# Patient Record
Sex: Male | Born: 1935 | Race: White | Hispanic: No | Smoking: Current every day smoker
Health system: Southern US, Community
[De-identification: ages and names within clinical notes are randomized; demographics above are authoritative.]

## PROBLEM LIST (undated history)

## (undated) DIAGNOSIS — R001 Bradycardia, unspecified: Secondary | ICD-10-CM

## (undated) DIAGNOSIS — H9190 Unspecified hearing loss, unspecified ear: Secondary | ICD-10-CM

## (undated) DIAGNOSIS — Z720 Tobacco use: Secondary | ICD-10-CM

## (undated) DIAGNOSIS — IMO0001 Reserved for inherently not codable concepts without codable children: Secondary | ICD-10-CM

## (undated) DIAGNOSIS — C61 Malignant neoplasm of prostate: Secondary | ICD-10-CM

## (undated) DIAGNOSIS — R011 Cardiac murmur, unspecified: Secondary | ICD-10-CM

## (undated) DIAGNOSIS — D649 Anemia, unspecified: Secondary | ICD-10-CM

## (undated) DIAGNOSIS — E785 Hyperlipidemia, unspecified: Secondary | ICD-10-CM

## (undated) DIAGNOSIS — I10 Essential (primary) hypertension: Secondary | ICD-10-CM

## (undated) HISTORY — DX: Anemia, unspecified: D64.9

## (undated) HISTORY — DX: Tobacco use: Z72.0

## (undated) HISTORY — DX: Hyperlipidemia, unspecified: E78.5

## (undated) HISTORY — DX: Bradycardia, unspecified: R00.1

## (undated) HISTORY — DX: Unspecified hearing loss, unspecified ear: H91.90

## (undated) HISTORY — DX: Essential (primary) hypertension: I10

## (undated) HISTORY — PX: PROSTATE SURGERY: SHX751

## (undated) HISTORY — DX: Cardiac murmur, unspecified: R01.1

## (undated) HISTORY — DX: Reserved for inherently not codable concepts without codable children: IMO0001

## (undated) HISTORY — PX: OTHER SURGICAL HISTORY: SHX169

## (undated) HISTORY — DX: Malignant neoplasm of prostate: C61

---

## 2002-09-23 DIAGNOSIS — C61 Malignant neoplasm of prostate: Secondary | ICD-10-CM

## 2002-09-23 HISTORY — DX: Malignant neoplasm of prostate: C61

## 2004-11-08 ENCOUNTER — Inpatient Hospital Stay (HOSPITAL_COMMUNITY): Admission: RE | Admit: 2004-11-08 | Discharge: 2004-11-13 | Payer: Self-pay | Admitting: Urology

## 2006-01-09 ENCOUNTER — Ambulatory Visit (HOSPITAL_COMMUNITY): Admission: RE | Admit: 2006-01-09 | Discharge: 2006-01-09 | Payer: Self-pay | Admitting: Internal Medicine

## 2006-12-02 ENCOUNTER — Ambulatory Visit (HOSPITAL_COMMUNITY): Admission: RE | Admit: 2006-12-02 | Discharge: 2006-12-02 | Payer: Self-pay | Admitting: Orthopaedic Surgery

## 2006-12-02 ENCOUNTER — Encounter (INDEPENDENT_AMBULATORY_CARE_PROVIDER_SITE_OTHER): Payer: Self-pay | Admitting: Specialist

## 2007-02-09 ENCOUNTER — Ambulatory Visit (HOSPITAL_COMMUNITY): Admission: RE | Admit: 2007-02-09 | Discharge: 2007-02-09 | Payer: Self-pay | Admitting: Internal Medicine

## 2008-09-01 ENCOUNTER — Ambulatory Visit: Payer: Self-pay | Admitting: Cardiology

## 2009-10-12 ENCOUNTER — Ambulatory Visit (HOSPITAL_COMMUNITY): Admission: RE | Admit: 2009-10-12 | Discharge: 2009-10-12 | Payer: Self-pay | Admitting: Nephrology

## 2011-02-05 NOTE — Assessment & Plan Note (Signed)
[facility]                       [place] CARDIOLOGY OFFICE NOTE   NAME:Nathan [name], Nathan [name]                    MRN:          [id number]  DATE:09/01/2008                            DOB:          [birth year]    [name] is a very pleasant 75 year old black gentleman who  is referred by Dr. Avon Gully for bradycardia.   He saw Dr. Felecia Shelling recently in the office where he had an EKG that shows  sinus bradycardia.   He has had no previous cardiac history.  He denies any presyncope,  syncope, exertional dizziness, or shortness of breath unless he really  overdoes it.  He has had no chest tightness or pressure that sounds like  angina.   He has had hypertension for a number of years.  He is currently on no AV  nodal slowing drugs.   He had recent blood work which showed a normal potassium, normal  magnesium, I do not see a TSH.  His calcium was also normal.   PAST MEDICAL HISTORY:  He has a history of prostate cancer about 5 years  ago.  He has had no outpatient surgeries.  He smokes about half a pack  of cigarettes a day and he has for number of years.  He does not drink.  He has no known drug allergies.   CURRENT MEDICATIONS:  1. Allopurinol 300 mg a day.  2. Lisinopril, HCTZ 20/12.5 daily.  3. Gemfibrozil 600 mg p.o. b.i.d.  4. Amlodipine 5 mg a day.  5. Niaspan 500 mg p.o. b.i.d..   I see a carbamazepine level which was therapeutic, but I do not see the  list in his med list.   SOCIAL HISTORY:  He is retired from [employer] in [place].  He did a lot  of manual work.  He is married and has three children.   His family history is really unremarkable for any premature coronary  artery disease or sudden cardiac death.   REVIEW OF SYSTEMS:  He has had some hearing loss in his left ear.  He  has partial plates of this top and bottom jaws.  There is no other  positive symptoms.  All review of systems were questioned.   PHYSICAL  EXAMINATION:  VITAL SIGNS:  He is 5 foot, 9 inches, weighs 187  pounds.  His weight is 128/72, pulse 55 and regular.  His  electrocardiogram shows sinus rhythm with an RSR prime in V1 and V2.  There is nonspecific ST-segment changes.  His PR, QRS, and QTC intervals  were normal.  HEENT:  Arcus senilis.  Sclerae are slightly muddy.  Facial symmetry is  normal.  Dentition reveals plates, oral mucosa is normal.  NECK:  Supple.  Carotid upstrokes were equal bilaterally without any  bruits, no JVD.  Thyroid is not enlarged or tender.  Trachea is midline.  LUNGS:  Clear to auscultation and percussion, reduced breath sounds  throughout.  HEART:  Nondisplaced PMI, soft S1 and S2.  No murmur, rub, or gallop.  ABDOMINAL:  Soft.  No pulsatile mass.  No flank bruits or midline  bruits.  There  is no obvious organomegaly.  EXTREMITIES:  No cyanosis, clubbing, or edema.  Pulses were 2+/4+,  bilaterally symmetrical.  No sign of DVT.  No edema.  NEURO:  Intact.   ASSESSMENT:  1. Asymptomatic bradycardia.  2. Unknown thyroid status.  3. Long history of hypertension.  4. Hyperlipidemia being managed by Dr. Felecia Shelling.  Note, that his last      lipid panel demonstrated an LDL of 145.  His triglycerides were      mildly elevated to 195.  5. Tobacco use.   RECOMMENDATIONS:  1. Check TSH.  2. Consider statin per Dr. Felecia Shelling.   I have reviewed at length symptoms of bradycardia with Nathan [name].  I  have told him to report to Dr. Felecia Shelling or to [place] if that happens.  We have  also talked about exertional shortness of breath and chest pain.  He is  at moderate risk of having an acute coronary event in the future.   I will suggest Dr. Felecia Shelling that he consider adding a low-dose aspirin 81  mg a day.  We will see him back on a p.r.n. basis.     Carzell C. Daleen Squibb, MD, [facility]  Electronically Signed    TCW/MedQ  DD: 09/01/2008  DT: 09/02/2008  Job #: [id number]   cc:   Tesfaye D. Felecia Shelling, MD

## 2011-02-08 NOTE — Consult Note (Signed)
NAME:  Nathan [name], Nathan [name]             ACCOUNT NO.:  [id number]   MEDICAL RECORD NO.:  [id number]          PATIENT TYPE:  INP   LOCATION:  IC07                          FACILITY:  [facility]   PHYSICIAN:  Tesfaye D. Felecia Shelling, MD   DATE OF BIRTH:  11/20/1935   DATE OF CONSULTATION:  11/08/2004  DATE OF DISCHARGE:                                   CONSULTATION   HISTORY OF PRESENT ILLNESS:  This is a 75 year old male patient who is  admitted to ICU after he had a radial prostatectomy for CA of the prostate.  The patient is well known to me from outpatient followup. He has known case  of hypertension. His hypertension has been well controlled. The patient  currently is semi-sedated, and he is not in any form of distress. The  patient's pain seems well controlled. He has no complaints.   REVIEW OF SYSTEMS:  The patient has some pain his surgical area. No  headache, fever, chest pain, shortness of breath, nausea, vomiting,  abdominal pain, dysuria, urgency, or frequency of urination.   PAST MEDICAL HISTORY:  1.  Hypertension.  2.  CA of the prostate.   MEDICATIONS:  As outpatient, Lisinopril HCT 20/12.5.   SOCIAL HISTORY:  The patient is married. He smokes about one pack of  cigarettes daily. The patient drinks alcohol on social basis. No history of  substance abuse.   PHYSICAL EXAMINATION:  GENERAL:  The patient is alert, awake and resting.  VITAL SIGNS:  Blood pressure 119/55, pulse 52, respiratory rate 25,  temperature 98 degree Fahrenheit.  HEENT:  Pupils are equal and reactive.  NECK:  Supple.  CHEST:  Fair air entry. Clear lung fields.  CARDIOVASCULAR:  First and second heart sounds heard. No murmur. No gallop.  ABDOMEN:  Soft _______________. Bowel sounds positive. No mass. No  organomegaly.  EXTREMITIES:  No leg edema.   LABORATORY DATA:  CBC:  5.3, hemoglobin 15.0, hematocrit 42.4, platelets  212. Sodium 138, potassium 4.1, chloride 107, carbon dioxide 26, BUN 17,  creatinine 1.4,  calcium 10.0.   ASSESSMENT:  This is a 75 year old male patient with a history of  hypertension and carcinoma of the prostate, currently post prostatectomy.  The patient has uneventful surgery. He is currently in ICU for monitoring.  His pain is well controlled.   PLAN:  Will continue patient with current treatment. Will continue pain  management. Will monitor his CBC and Chem 7. Continue supportive care.      TDF/MEDQ  D:  11/09/2004  T:  11/09/2004  Job:  [id number]

## 2011-02-08 NOTE — Discharge Summary (Signed)
NAME:  Nathan [name], Nathan [name]             ACCOUNT NO.:  [id number]   MEDICAL RECORD NO.:  [id number]          PATIENT TYPE:  INP   LOCATION:  A201                          FACILITY:  [facility]   PHYSICIAN:  Ky Barban, M.D.DATE OF BIRTH:  12/15/1935   DATE OF ADMISSION:  11/08/2004  DATE OF DISCHARGE:  02/21/2006LH                                 DISCHARGE SUMMARY   A 75 year old gentleman who had elevated PSA of 4.7.  Prostate biopsy was  done which showed Gleason score of 5.  He was advised to undergo radical  prostatectomy.  Other alternatives like radiotherapy were also discussed.  He elected to have radical prostatectomy.  He had routine preadmission  workup done, CBC, urinalysis, CHEM-7, EKG, chest x-ray normal.  He was  brought in on February 16 and underwent radical retropubic prostatectomy and  bilateral pelvic node dissection.  His frozen sections were negative, and so  we did a radical prostatectomy.  He did fine during the procedure, did not  require any blood transfusions.  Preoperatively, hematocrit was 42.4;  postprocedure, hematocrit was 28.9.   His electrolytes preoperatively: Sodium 138, potassium 4.1, chloride 107,  CO2 26, glucose 102, BUN 17, creatinine 1.4, calcium 10.1.  On postoperative  day, February 17, sodium 133, potassium 3.4, chloride 102, CO2 26, glucose  127, BUN 12, creatinine 1.3, calcium 8.   He was closely followed by his family physician, Dr. Felecia Shelling.  KCl was added  to his IV fluids.  There was minimum drainage.   On the second postop day he was doing fine, afebrile.  Blood pressure was  stable.  I took out his ureteral catheter, and shortly some drained.  He was  discharged from ICU onto the floor.  He has been having good bowel sounds.  He was started on clear liquid diet.   On the third postoperative day, the diet was advanced to a regular diet.  He  is up and walking around, and he was given IV Cipro as prophylactic  antibiotic during the  hospital stay.   On 19 February, doing well, afebrile.  Urine is clear.  Started him on  regular diet.  On February 21, he is being discharged home and will be  followed up by me in the office.   His final pathology report is back.  His TNM code is T2C, NO, MX.  He has  cancer involving both right and left lobe of the prostate.  Gleason score is  5.  Capsular extension is not observed.  Urethral and bladder margins are  negative.  Seminal vesicle margins are negative.   So at this point, the wound is healing up primarily.  He is being discharged  for followup by me in the office.   FINAL DISCHARGE DIAGNOSES:  1.  Carcinoma of prostate.  2.  Hypertension.   He is advised to resume his medication, and he was given Percocet for pain.   I will see him back in the office next week to take his stitches out.  He is  going home with a Foley catheter.  MIJ/MEDQ  D:  12/23/2004  T:  12/23/2004  Job:  [id number]   cc:   Tesfaye D. Felecia Shelling, MD  [address]  [place]  [place] [address]  Fax: [phone]

## 2011-02-08 NOTE — H&P (Signed)
NAME:  Nathan [name], Nathan [name]             ACCOUNT NO.:  [id number]   MEDICAL RECORD NO.:  [id number]          PATIENT TYPE:  AMB   LOCATION:  DAY                           FACILITY:  [facility]   PHYSICIAN:  J. Darreld Mclean, M.D. DATE OF BIRTH:  [birth year]   DATE OF ADMISSION:  DATE OF DISCHARGE:  LH                              HISTORY & PHYSICAL   CHIEF COMPLAINT:  My elbow hurts.   The patient is a 75 year old male with pain and tenderness on his right  elbow.  He has had chronic right arm bursitis and a large osteophyte on  the elbow for several years.  It has gotten progressively worse.  I  first saw him in the office on January 15, 2006, with pain in the  olecranon area.  He had swelling in the elbow, and it comes up and it  goes down.  Last year he desired only observation and medication, and he  wanted to see how it did.  It has continued to bother him at times.  He  has difficulty when he sits because he leans against his elbow, and it  hurts.  He has a large osteophyte and chronic bursitis.  He has not had  infection.  He would like to go ahead and have the surgery at this time.   PAST MEDICAL HISTORY:  Positive for hypertension and cancer.   ALLERGIES:  He has no allergies.   MEDICATIONS:  1. Gemfibrozil 600 mg b.i.d.  2. Zetia 10 mg daily.  3. Verapamil SA 120 mg daily.  4. Lisinopril/hydrochlorothiazide 20/12.5 daily.   He smokes and uses alcohol socially.  Dr. Felecia Shelling is his family doctor.  Hypertension runs in the family, and he is married and lives in town.   He has had cancer of the prostate and has had surgery on the prostate as  his only surgery.   PHYSICAL EXAMINATION:  GENERAL:  The patient is alert, cooperative,  oriented.  HEENT:  Negative.  NECK: Supple.  LUNGS:  Clear to percussion and auscultation.  HEART: Regular rate and rhythm without murmur heard.  ABDOMEN:  Soft without mass.  EXTREMITIES:  Right elbow olecranon bursitis with large osteophyte.  Neurovascularly intact.  Other extremities within normal limits.  CNS:  Intact.  SKIN: Intact.   IMPRESSION:  Olecranon bursitis on the right.   PLAN:  Elective excision of the bursa, trimming down the osteophyte as  possible.  Discussed with patient the procedure, risks, and  imponderables.  He will have a large bulky dressing and a drain placed  after surgery to be removed the day after surgery in the office.  This  is another patient procedure.  The patient's labs are pending.  The  patient appears to understand and agrees to the procedure as outlined.                                            ______________________________  J. Darreld Mclean, M.D.     JWK/MEDQ  D:  12/01/2006  T:  12/01/2006  Job:  [id number]

## 2011-02-08 NOTE — Op Note (Signed)
NAME:  Nathan [name], Nathan [name]             ACCOUNT NO.:  [id number]   MEDICAL RECORD NO.:  [id number]          PATIENT TYPE:  AMB   LOCATION:  DAY                           FACILITY:  [facility]   PHYSICIAN:  J. Darreld Mclean, M.D. DATE OF BIRTH:  12-25-1935   DATE OF PROCEDURE:  DATE OF DISCHARGE:                               OPERATIVE REPORT   PREOPERATIVE DIAGNOSIS:  Olecranon bursitis, right elbow.   POSTOPERATIVE DIAGNOSES:  1. Olecranon bursitis, right elbow.  2. Gout tophus.   PROCEDURE:  1. Excision olecranon bursa right elbow.  2. Excision of small osteophyte.  3. Removal of some gouty tophus.   INDICATIONS:  Patient is a 75 year old male with pain and tenderness in  his right elbow.  He has got recurring olecranon bursitis.  It has never  been infected. it is bothering him.  He would like to have it excised.  Risks and imponderables of procedure were discussed with the patient  preoperatively, he appeared to agree and understand the procedure.   DESCRIPTION OF PROCEDURE:  The patient was seen in the holding area and  the right elbow was identified at the correct surgical site.  He placed  his initials on the right elbow, I placed my initials.  I talked to the  family.  Patient brought back to the operating room.  He was given  general anesthesia while supine on the operating room table.  Tourniquet  placed deflated right upper arm.  He was prepped and draped in the usual  manner.  We had a time-out to identify [name] as the patient and  the right elbow as the correct surgical site.   The arm was elevated, wrapped circumferentially with an Esmarch bandage,  tourniquet inflated to 250 mmHg, Esmarch bandage removed.  An incision  then made over the olecranon bursa area.  With careful dissection the  bursa was removed.  There was evidence of small whitish crystal material  around this consistent with gout.  He had some gout deposit around the  triceps insertion on the  olecranon.  He had a prominent osteophyte or  spur and this was removed with a rongeur and then smoothed with a rasp.  The wound was reapproximated using #2-0 chromic, #2-0 plain and then  skin staples.  A Penrose drain was placed prior to closure of the wound.  A bulky dressing applied.  I will see him in my office tomorrow, a  prescription for Vicodin ES given for pain.  Any difficulties, contact  me through the office/hospital beeper system.          ______________________________  Shela Commons. Darreld Mclean, M.D.    JWK/MEDQ  D:  12/02/2006  T:  12/02/2006  Job:  [id number]

## 2011-02-08 NOTE — H&P (Signed)
NAME:  Nathan [name], Nathan [name]             ACCOUNT NO.:  [id number]   MEDICAL RECORD NO.:  [id number]          PATIENT TYPE:  AMB   LOCATION:  DAY                           [facility]   PHYSICIAN:  Ky Barban, M.D.DATE OF BIRTH:  [birth year]   DATE OF ADMISSION:  DATE OF DISCHARGE:  LH                                HISTORY & PHYSICAL   CHIEF COMPLAINT:  Prostate cancer.   HISTORY:  This is a 75 year old gentleman, was seen by me on August 02, 2004, after he was referred by Dr. Felecia Shelling because his PSA was 4.4.  He has  nocturia x 2.  Sometimes his flow is slow.  Otherwise, he is not having any  problem.  I repeated his PSA and found out __________.4.  In 1 week it has  gone up to 4.7.  One of his brothers has died with prostate cancer with  strong family history.  I recommended that we do prostate biopsy which was  done on December 9, and it does show that he has intermediate grade  adenocarcinoma on Gleason score 3 + 2 = 5.  I discussed the finding with the  patient and his family.  Because he is in good health, 65 years ago, I gave  him two choices, a radical prostatectomy versus radiotherapy.  Discussed  pros and cons of both treatments.  I told them about radical prostatectomy,  that it causes urinary incontinence.  Most of the time, it is not permanent.  Rectal dysfunction which will be permanent, and the need for a blood  transfusion in addition to any other mishap of any major surgical procedure.  And I met him a couple of times, and he came back and told me that he wanted  to have surgery, so he is coming as outpatient to undergo radical  prostatectomy, bilateral pelvic node dissection.   PAST MEDICAL HISTORY:  He has hypertension, and he is on diuretics.   FAMILY HISTORY:  One brother died with prostate cancer.   SOCIAL HISTORY:  He smokes 1 pack a day.  He drinks occasionally.   REVIEW OF SYSTEMS:  Denies any chest pain, orthopnea, PND, nausea, vomiting,  fever, or  chills.   PHYSICAL EXAMINATION:  VITAL SIGNS:  Blood pressure is 140/80.  Temperature  is normal.  CENTRAL NERVOUS SYSTEM:  No gross neurological deficit.  HEAD AND NECK:  Negative.  CHEST:  Symmetrical.  HEART:  Regular sinus rhythm.  ABDOMEN:  Soft, flat.  Liver, spleen, kidneys are not palpable.  No CVA  tenderness.  EXTERNAL GENITOURINARY:  He has uncircumcised meatus, and his testicles are  normal.  RECTAL EXAM:  Normal sphincter tone.  No rectal mass.  Prostate 1.5+, smooth  and firm.   IMPRESSION:  1.  Carcinoma of prostate.  2.  Hypertension.   PLAN:  Bilateral pelvic node dissection and radical retropubic  prostatectomy.      MIJ/MEDQ  D:  11/06/2004  T:  11/06/2004  Job:  [id number]   cc:   Tesfaye D. Felecia Shelling, MD  [address]  [place]  [place] [address]  Fax: [phone]

## 2011-02-19 DIAGNOSIS — M109 Gout, unspecified: Secondary | ICD-10-CM | POA: Insufficient documentation

## 2011-10-07 DIAGNOSIS — N189 Chronic kidney disease, unspecified: Secondary | ICD-10-CM | POA: Diagnosis not present

## 2011-10-07 DIAGNOSIS — Z79899 Other long term (current) drug therapy: Secondary | ICD-10-CM | POA: Diagnosis not present

## 2011-10-07 DIAGNOSIS — I10 Essential (primary) hypertension: Secondary | ICD-10-CM | POA: Diagnosis not present

## 2011-10-07 DIAGNOSIS — D649 Anemia, unspecified: Secondary | ICD-10-CM | POA: Diagnosis not present

## 2011-10-07 DIAGNOSIS — M109 Gout, unspecified: Secondary | ICD-10-CM | POA: Diagnosis not present

## 2011-10-10 DIAGNOSIS — E872 Acidosis: Secondary | ICD-10-CM | POA: Diagnosis not present

## 2011-10-10 DIAGNOSIS — I10 Essential (primary) hypertension: Secondary | ICD-10-CM | POA: Diagnosis not present

## 2012-01-14 ENCOUNTER — Encounter: Payer: Self-pay | Admitting: Cardiology

## 2012-01-15 ENCOUNTER — Encounter: Payer: Self-pay | Admitting: Cardiology

## 2012-01-15 ENCOUNTER — Encounter: Payer: Self-pay | Admitting: *Deleted

## 2012-01-15 ENCOUNTER — Ambulatory Visit (INDEPENDENT_AMBULATORY_CARE_PROVIDER_SITE_OTHER): Payer: Medicare Other | Admitting: Cardiology

## 2012-01-15 VITALS — BP 155/67 | HR 43 | Resp 18 | Ht 69.0 in | Wt 169.0 lb

## 2012-01-15 DIAGNOSIS — I498 Other specified cardiac arrhythmias: Secondary | ICD-10-CM | POA: Diagnosis not present

## 2012-01-15 DIAGNOSIS — R001 Bradycardia, unspecified: Secondary | ICD-10-CM

## 2012-01-15 DIAGNOSIS — I10 Essential (primary) hypertension: Secondary | ICD-10-CM | POA: Diagnosis not present

## 2012-01-15 DIAGNOSIS — C61 Malignant neoplasm of prostate: Secondary | ICD-10-CM

## 2012-01-15 DIAGNOSIS — M109 Gout, unspecified: Secondary | ICD-10-CM

## 2012-01-15 DIAGNOSIS — Z720 Tobacco use: Secondary | ICD-10-CM

## 2012-01-15 DIAGNOSIS — E785 Hyperlipidemia, unspecified: Secondary | ICD-10-CM

## 2012-01-15 DIAGNOSIS — R011 Cardiac murmur, unspecified: Secondary | ICD-10-CM

## 2012-01-15 NOTE — Progress Notes (Deleted)
Name: CLEARENCE VITUG    DOB: 20-Apr-1936  Age: 76 y.o.  MR#: [id number]       PCP:  Avon Gully, MD, MD      Insurance: @PAYORNAME @   CC:    Chief Complaint  Patient presents with  . BRADYCARDIA    Per Dr Felecia Shelling - brady - seen in 09 by Dr Daleen Squibb for same/TC, +meds    VS BP 155/67  Pulse 43  Resp 18  Ht 5\' 9"  (1.753 m)  Wt 169 lb (76.658 kg)  BMI 24.96 kg/m2  Weights Current Weight  01/15/12 169 lb (76.658 kg)    Blood Pressure  BP Readings from Last 3 Encounters:  01/15/12 155/67     Admit date:  (Not on file) Last encounter with RMR:  01/14/2012   Allergy No Known Allergies  Current Outpatient Prescriptions  Medication Sig Dispense Refill  . allopurinol (ZYLOPRIM) 300 MG tablet Take 300 mg by mouth daily.      Marland Kitchen aspirin 81 MG tablet Take 81 mg by mouth daily.      Marland Kitchen gemfibrozil (LOPID) 600 MG tablet Take 600 mg by mouth 2 (two) times daily before a meal.      . lisinopril (PRINIVIL,ZESTRIL) 20 MG tablet Take 20 mg by mouth daily.      . niacin (NIASPAN) 500 MG CR tablet Take 500 mg by mouth 2 (two) times daily.      . sodium bicarbonate 650 MG tablet Take 650 mg by mouth 3 (three) times daily.        Discontinued Meds:    Medications Discontinued During This Encounter  Medication Reason  . lisinopril-hydrochlorothiazide (PRINZIDE,ZESTORETIC) 20-12.5 MG per tablet Error  . amLODipine (NORVASC) 5 MG tablet Error    Patient Active Problem List  Diagnoses  . Tobacco abuse    LABS No results found for any previous visit.   Results for this Opt Visit:    No results found for this or any previous visit.  EKG No orders found for this or any previous visit.   Prior Assessment and Plan Problem List as of 01/15/2012        Tobacco abuse       Imaging: No results found.   FRS Calculation: Score not calculated. Missing: Total Cholesterol, HDL

## 2012-01-15 NOTE — Patient Instructions (Addendum)
Your physician recommends that you schedule a follow-up appointment in: 1 year  Your physician recommends that you return for lab work in: 1 month (You will receive a letter)  Your physician has recommended you make the following change in your medication:  1 STOP NIACIN 2 STOP GEMFIBROZIL

## 2012-01-17 ENCOUNTER — Encounter: Payer: Self-pay | Admitting: Cardiology

## 2012-01-17 DIAGNOSIS — C61 Malignant neoplasm of prostate: Secondary | ICD-10-CM | POA: Insufficient documentation

## 2012-01-17 DIAGNOSIS — I10 Essential (primary) hypertension: Secondary | ICD-10-CM | POA: Insufficient documentation

## 2012-01-17 DIAGNOSIS — R001 Bradycardia, unspecified: Secondary | ICD-10-CM | POA: Insufficient documentation

## 2012-01-17 DIAGNOSIS — R011 Cardiac murmur, unspecified: Secondary | ICD-10-CM | POA: Insufficient documentation

## 2012-01-17 NOTE — Progress Notes (Signed)
Patient ID: Nathan [name], male   DOB: Nov 21, 1935, 76 y.o.   MRN: [id number]  HPI: Initial visit to my office at the request of Dr. Avon Gully for evaluation of bradycardia.  This nice gentleman was previously seen by Dr. Daleen Squibb in 2009 for the same issue.  He was asymptomatic at that time without any manifest structural heart disease prompting a recommendation for no further assessment or treatment.  He subsequently has done well, enjoying generally good health, and experiencing no symptoms referable to bradycardia.    Current Outpatient Prescriptions on File Prior to Visit  Medication Sig Dispense Refill  . allopurinol (ZYLOPRIM) 300 MG tablet Take 300 mg by mouth daily.      Marland Kitchen lisinopril (PRINIVIL,ZESTRIL) 20 MG tablet Take 20 mg by mouth daily.       No Known Allergies    Past Medical History  Diagnosis Date  . Bradycardia     Evaluated by Dr. Nelda Severe in 2009  . Hypertension   . Carcinoma of prostate 2004  . Gout   . Hyperlipidemia   . Tobacco abuse     0.5 pack per day; 30 pack years  . Auditory impairment     OS    Past Surgical History  Procedure Date  . Prostate surgery   . Bone spur    Family History  Problem Relation Age of Onset  . Coronary artery disease Neg Hx   . Heart disease Mother   . Arrhythmia Mother     brother(brady)    History   Social History  . Marital Status: Married    Spouse Name: N/A    Number of Children: N/A  . Years of Education: N/A   Occupational History  . Retired [employer]   Social History Main Topics  . Smoking status: Current Everyday Smoker -- 0.5 packs/day for 55 years  . Smokeless tobacco: Not on file  . Alcohol Use: Yes  . Drug Use: No  . Sexually Active: Not on file   Other Topics Concern  . Not on file   Social History Narrative  . No narrative on file    ROS:  + sinus symptoms; Denies orthopnea, PND, chest pain, dyspnea, lightheadedness, palpitations, or syncope.  He underwent colonoscopy in 1989.     All other systems reviewed and are negative.  PHYSICAL EXAM: BP 155/67  Pulse 43  Resp 18  Ht 5\' 9"  (1.753 m)  Wt 76.658 kg (169 lb)  BMI 24.96 kg/m2  General-Well-developed; no acute distress Body Habitus-proportionate weight and height HEENT-[place]/AT; PERRL; EOM intact; conjunctiva and lids nl Neck-No JVD; no carotid bruits Endocrine-No thyromegaly Lungs-Clear lung fields; resonant percussion; normal I-to-E ratio Cardiovascular- normal PMI; normal S1 and S2; grade 1-2/6 holosystolic murmur at the left sternal border and apex Abdomen-BS normal; soft and non-tender without masses or organomegaly Musculoskeletal-No deformities, cyanosis or clubbing Neurologic-Nl cranial nerves; symmetric strength and tone Skin- Warm, no significant lesions Extremities-Nl distal pulses; no edema  EKG:  Sinus bradycardia at a rate of 46 bpm, mild inferior ST segment depression, U waves present, nondiagnostic lateral Q waves, and prominent QRS voltage.  No previous tracing for comparison. 6 minute walk test: Traversed 800 feet; heart rate increased to 57 bpm.   ASSESSMENT AND PLAN:  Griffin Bing, MD 01/17/2012 11:13 AM

## 2012-01-18 NOTE — Assessment & Plan Note (Signed)
Blood pressure is somewhat elevated at this visit.  Additional determinations will be collected by the patient and return to our office.

## 2012-01-18 NOTE — Assessment & Plan Note (Signed)
Nathan [name] remains asymptomatic with respect to sinus bradycardia.  Previous EKG included a computerized interpretation of atrial fibrillation, which was in error.  In the absence of more significant arrhythmias, no further attention is required to his conduction system unless and until he developed symptoms.

## 2012-01-18 NOTE — Assessment & Plan Note (Addendum)
Patient advised to discontinue use of tobacco products.

## 2012-01-18 NOTE — Assessment & Plan Note (Signed)
No lipid determinations available for review; a profile will be obtained.

## 2012-02-06 DIAGNOSIS — R809 Proteinuria, unspecified: Secondary | ICD-10-CM | POA: Diagnosis not present

## 2012-02-06 DIAGNOSIS — N189 Chronic kidney disease, unspecified: Secondary | ICD-10-CM | POA: Diagnosis not present

## 2012-02-06 DIAGNOSIS — Z79899 Other long term (current) drug therapy: Secondary | ICD-10-CM | POA: Diagnosis not present

## 2012-02-06 DIAGNOSIS — D649 Anemia, unspecified: Secondary | ICD-10-CM | POA: Diagnosis not present

## 2012-02-11 DIAGNOSIS — I10 Essential (primary) hypertension: Secondary | ICD-10-CM | POA: Diagnosis not present

## 2012-02-11 DIAGNOSIS — D649 Anemia, unspecified: Secondary | ICD-10-CM | POA: Diagnosis not present

## 2012-02-11 DIAGNOSIS — R809 Proteinuria, unspecified: Secondary | ICD-10-CM | POA: Diagnosis not present

## 2012-02-14 ENCOUNTER — Other Ambulatory Visit: Payer: Self-pay | Admitting: Cardiology

## 2012-02-14 DIAGNOSIS — I10 Essential (primary) hypertension: Secondary | ICD-10-CM | POA: Diagnosis not present

## 2012-02-14 LAB — CBC
MCH: 28.7 pg (ref 26.0–34.0)
MCHC: 34.6 g/dL (ref 30.0–36.0)
Platelets: 186 10*3/uL (ref 150–400)
RDW: 15.6 % — ABNORMAL HIGH (ref 11.5–15.5)

## 2012-02-14 LAB — COMPREHENSIVE METABOLIC PANEL
AST: 22 U/L (ref 0–37)
Albumin: 4.6 g/dL (ref 3.5–5.2)
Alkaline Phosphatase: 79 U/L (ref 39–117)
Glucose, Bld: 97 mg/dL (ref 70–99)
Potassium: 4.4 mEq/L (ref 3.5–5.3)
Sodium: 141 mEq/L (ref 135–145)
Total Bilirubin: 0.4 mg/dL (ref 0.3–1.2)
Total Protein: 7.2 g/dL (ref 6.0–8.3)

## 2012-02-14 LAB — LIPID PANEL: Triglycerides: 475 mg/dL — ABNORMAL HIGH (ref <150)

## 2012-02-14 LAB — TSH: TSH: 2.797 u[IU]/mL (ref 0.350–4.500)

## 2012-02-19 ENCOUNTER — Encounter: Payer: Self-pay | Admitting: *Deleted

## 2012-02-19 ENCOUNTER — Other Ambulatory Visit: Payer: Self-pay | Admitting: *Deleted

## 2012-02-19 DIAGNOSIS — E782 Mixed hyperlipidemia: Secondary | ICD-10-CM

## 2012-02-19 MED ORDER — SIMVASTATIN 40 MG PO TABS: 40.0000 mg | ORAL_TABLET | Freq: Every day | ORAL | Status: DC

## 2012-02-23 ENCOUNTER — Encounter: Payer: Self-pay | Admitting: Cardiology

## 2012-02-23 DIAGNOSIS — R7301 Impaired fasting glucose: Secondary | ICD-10-CM | POA: Insufficient documentation

## 2012-02-23 DIAGNOSIS — N189 Chronic kidney disease, unspecified: Secondary | ICD-10-CM | POA: Insufficient documentation

## 2012-02-23 DIAGNOSIS — D649 Anemia, unspecified: Secondary | ICD-10-CM | POA: Insufficient documentation

## 2012-03-04 DIAGNOSIS — I10 Essential (primary) hypertension: Secondary | ICD-10-CM | POA: Diagnosis not present

## 2012-03-04 DIAGNOSIS — E7800 Pure hypercholesterolemia, unspecified: Secondary | ICD-10-CM | POA: Diagnosis not present

## 2012-03-09 DIAGNOSIS — C61 Malignant neoplasm of prostate: Secondary | ICD-10-CM | POA: Diagnosis not present

## 2012-03-11 DIAGNOSIS — C679 Malignant neoplasm of bladder, unspecified: Secondary | ICD-10-CM | POA: Diagnosis not present

## 2012-03-23 ENCOUNTER — Other Ambulatory Visit: Payer: Self-pay | Admitting: Cardiology

## 2012-03-23 DIAGNOSIS — E782 Mixed hyperlipidemia: Secondary | ICD-10-CM | POA: Diagnosis not present

## 2012-03-23 LAB — LIPID PANEL
HDL: 36 mg/dL — ABNORMAL LOW (ref >39)
LDL Cholesterol: 68 mg/dL (ref 0–99)
Triglycerides: 162 mg/dL — ABNORMAL HIGH (ref <150)

## 2012-03-23 LAB — LDL CHOLESTEROL, DIRECT: Direct LDL: 60 mg/dL

## 2012-06-04 DIAGNOSIS — N19 Unspecified kidney failure: Secondary | ICD-10-CM | POA: Diagnosis not present

## 2012-06-04 DIAGNOSIS — I10 Essential (primary) hypertension: Secondary | ICD-10-CM | POA: Diagnosis not present

## 2012-06-04 DIAGNOSIS — Z23 Encounter for immunization: Secondary | ICD-10-CM | POA: Diagnosis not present

## 2012-06-15 DIAGNOSIS — E559 Vitamin D deficiency, unspecified: Secondary | ICD-10-CM | POA: Diagnosis not present

## 2012-06-15 DIAGNOSIS — D649 Anemia, unspecified: Secondary | ICD-10-CM | POA: Diagnosis not present

## 2012-06-15 DIAGNOSIS — N189 Chronic kidney disease, unspecified: Secondary | ICD-10-CM | POA: Diagnosis not present

## 2012-06-15 DIAGNOSIS — Z79899 Other long term (current) drug therapy: Secondary | ICD-10-CM | POA: Diagnosis not present

## 2012-06-17 DIAGNOSIS — I10 Essential (primary) hypertension: Secondary | ICD-10-CM | POA: Diagnosis not present

## 2012-06-17 DIAGNOSIS — E559 Vitamin D deficiency, unspecified: Secondary | ICD-10-CM | POA: Diagnosis not present

## 2012-06-17 DIAGNOSIS — D649 Anemia, unspecified: Secondary | ICD-10-CM | POA: Diagnosis not present

## 2012-09-02 DIAGNOSIS — C679 Malignant neoplasm of bladder, unspecified: Secondary | ICD-10-CM | POA: Diagnosis not present

## 2012-09-02 DIAGNOSIS — C61 Malignant neoplasm of prostate: Secondary | ICD-10-CM | POA: Diagnosis not present

## 2012-09-03 DIAGNOSIS — I10 Essential (primary) hypertension: Secondary | ICD-10-CM | POA: Diagnosis not present

## 2012-09-03 DIAGNOSIS — N19 Unspecified kidney failure: Secondary | ICD-10-CM | POA: Diagnosis not present

## 2012-09-03 DIAGNOSIS — C61 Malignant neoplasm of prostate: Secondary | ICD-10-CM | POA: Diagnosis not present

## 2012-09-09 ENCOUNTER — Telehealth: Payer: Self-pay | Admitting: *Deleted

## 2012-09-09 ENCOUNTER — Other Ambulatory Visit: Payer: Self-pay

## 2012-09-09 DIAGNOSIS — Z139 Encounter for screening, unspecified: Secondary | ICD-10-CM

## 2012-09-09 DIAGNOSIS — C61 Malignant neoplasm of prostate: Secondary | ICD-10-CM | POA: Diagnosis not present

## 2012-09-09 NOTE — Telephone Encounter (Signed)
Nathan [name] came by the office this am to be triaged for a colonoscopy. Please give him a call. Thanks.

## 2012-09-09 NOTE — Telephone Encounter (Signed)
See separate triage.  

## 2012-09-14 ENCOUNTER — Telehealth: Payer: Self-pay

## 2012-09-14 ENCOUNTER — Encounter (HOSPITAL_COMMUNITY): Payer: Self-pay | Admitting: Pharmacy Technician

## 2012-09-14 NOTE — Telephone Encounter (Signed)
   Gastroenterology Pre-Procedure Form  Request Date: 09/09/2012     Requesting Physician: Dr. Felecia Shelling     PATIENT INFORMATION:  Nathan [name] is a 76 y.o., male (DOB=1936/05/11).  PROCEDURE: Procedure(s) requested: colonoscopy Procedure Reason: screening for colon cancer  PATIENT REVIEW QUESTIONS: The patient reports the following:   1. Diabetes Melitis: no 2. Joint replacements in the past 12 months: no 3. Major health problems in the past 3 months: no 4. Has an artificial valve or MVP:no 5. Has been advised in past to take antibiotics in advance of a procedure like teeth cleaning: no}    MEDICATIONS & ALLERGIES:    Patient reports the following regarding taking any blood thinners:   Plavix? no Aspirin?yes  Coumadin?  no  Patient confirms/reports the following medications:  Current Outpatient Prescriptions  Medication Sig Dispense Refill  . allopurinol (ZYLOPRIM) 300 MG tablet Take 300 mg by mouth daily.      Marland Kitchen aspirin 81 MG tablet Take 81 mg by mouth daily.      . cholecalciferol (VITAMIN D) 1000 UNITS tablet Take 1,000 Units by mouth daily.      Marland Kitchen lisinopril (PRINIVIL,ZESTRIL) 20 MG tablet Take 20 mg by mouth daily.      . simvastatin (ZOCOR) 40 MG tablet Take 1 tablet (40 mg total) by mouth at bedtime.  90 tablet  3  . sodium bicarbonate 650 MG tablet Take 650 mg by mouth 3 (three) times daily.        Patient confirms/reports the following allergies:  No Known Allergies  Patient is appropriate to schedule for requested procedure(s): yes  AUTHORIZATION INFORMATION Primary Insurance:  ID #: Group #:  Pre-Cert / Auth required:  Pre-Cert / Auth #:   Secondary Insurance:   ID #:  Group #:  Pre-Cert / Auth required:  Pre-Cert / Auth #:   No orders of the defined types were placed in this encounter.    SCHEDULE INFORMATION: Procedure has been scheduled as follows:  Date:  10/02/2012        Time: 10:30 AM        Location: [facility] Short Stay  This  Gastroenterology Pre-Precedure Form is being routed to the following provider(s) for review: Jonette Eva, MD

## 2012-09-14 NOTE — Telephone Encounter (Signed)
MOVI PREP SPLIT DOSING, REGULAR BREAKFAST. CLEAR LIQUIDS AFTER 9 AM.  

## 2012-09-15 MED ORDER — PEG-KCL-NACL-NASULF-NA ASC-C 100 G PO SOLR: 1.0000 | ORAL | Status: DC

## 2012-09-15 NOTE — Telephone Encounter (Signed)
Rx sent to [facility] in [place]. Instructions mailed to pt.  

## 2012-10-02 ENCOUNTER — Encounter (HOSPITAL_COMMUNITY): Payer: Self-pay | Admitting: *Deleted

## 2012-10-02 ENCOUNTER — Encounter (HOSPITAL_COMMUNITY)
Admission: RE | Disposition: A | Discharge status: Home / Self Care | Payer: Medicare Other | Source: Ambulatory Visit | Attending: Gastroenterology

## 2012-10-02 ENCOUNTER — Ambulatory Visit (HOSPITAL_COMMUNITY)
Admission: RE | Admit: 2012-10-02 | Discharge: 2012-10-02 | Disposition: A | Discharge status: Home / Self Care | Payer: Medicare Other | Source: Ambulatory Visit | Attending: Gastroenterology | Admitting: Gastroenterology

## 2012-10-02 DIAGNOSIS — D128 Benign neoplasm of rectum: Secondary | ICD-10-CM | POA: Diagnosis not present

## 2012-10-02 DIAGNOSIS — I10 Essential (primary) hypertension: Secondary | ICD-10-CM | POA: Diagnosis not present

## 2012-10-02 DIAGNOSIS — K620 Anal polyp: Secondary | ICD-10-CM | POA: Diagnosis not present

## 2012-10-02 DIAGNOSIS — Z1211 Encounter for screening for malignant neoplasm of colon: Secondary | ICD-10-CM | POA: Diagnosis not present

## 2012-10-02 DIAGNOSIS — K648 Other hemorrhoids: Secondary | ICD-10-CM | POA: Diagnosis not present

## 2012-10-02 DIAGNOSIS — K5730 Diverticulosis of large intestine without perforation or abscess without bleeding: Secondary | ICD-10-CM | POA: Insufficient documentation

## 2012-10-02 DIAGNOSIS — K621 Rectal polyp: Secondary | ICD-10-CM

## 2012-10-02 DIAGNOSIS — D126 Benign neoplasm of colon, unspecified: Secondary | ICD-10-CM | POA: Diagnosis not present

## 2012-10-02 DIAGNOSIS — Z139 Encounter for screening, unspecified: Secondary | ICD-10-CM

## 2012-10-02 HISTORY — PX: COLONOSCOPY: SHX5424

## 2012-10-02 SURGERY — COLONOSCOPY
Anesthesia: Moderate Sedation

## 2012-10-02 MED ORDER — MIDAZOLAM HCL 5 MG/5ML IJ SOLN
INTRAMUSCULAR | Status: AC
  Filled 2012-10-02: qty 10

## 2012-10-02 MED ORDER — MEPERIDINE HCL 100 MG/ML IJ SOLN
INTRAMUSCULAR | Status: AC
  Filled 2012-10-02: qty 1

## 2012-10-02 MED ORDER — ATROPINE SULFATE 1 MG/ML IJ SOLN
INTRAMUSCULAR | Status: AC
  Filled 2012-10-02: qty 1

## 2012-10-02 MED ORDER — MIDAZOLAM HCL 5 MG/5ML IJ SOLN
INTRAMUSCULAR | Status: DC | PRN
  Administered 2012-10-02: 2 mg via INTRAVENOUS

## 2012-10-02 MED ORDER — SODIUM CHLORIDE 0.45 % IV SOLN
INTRAVENOUS | Status: DC
  Administered 2012-10-02: 10:00:00 via INTRAVENOUS

## 2012-10-02 MED ORDER — MEPERIDINE HCL 100 MG/ML IJ SOLN
INTRAMUSCULAR | Status: DC | PRN
  Administered 2012-10-02: 25 mg via INTRAVENOUS

## 2012-10-02 MED ORDER — ATROPINE SULFATE 1 MG/ML IJ SOLN
INTRAMUSCULAR | Status: DC | PRN
  Administered 2012-10-02 (×2): .5 mg via INTRAVENOUS

## 2012-10-02 MED ORDER — STERILE WATER FOR IRRIGATION IR SOLN
Status: DC | PRN
  Administered 2012-10-02 (×2)

## 2012-10-02 NOTE — Op Note (Addendum)
[facility] [address] [place], [address]   COLONOSCOPY PROCEDURE REPORT  PATIENT: Nathan, [name]  MR#: [id number] BIRTHDATE: [birth year] , 76  yrs. old GENDER: Male ENDOSCOPIST: Jonette Eva, MD REFERRED XB:[id number] Fanta, M.D. PROCEDURE DATE:  10/02/2012 PROCEDURE:   Colonoscopy with snare polypectomy and Colonoscopy with cold biopsy polypectomy INDICATIONS:Average risk patient for colon cancer. MEDICATIONS: Demerol 25 mg IV, Versed 3 mg IV, and Atropine 1 mg IV   DESCRIPTION OF PROCEDURE:    Physical exam was performed.  Informed consent was obtained from the patient after explaining the benefits, risks, and alternatives to procedure.  The patient was connected to monitor and placed in left lateral position. Continuous oxygen was provided by nasal cannula and IV medicine administered through an indwelling cannula.  After administration of sedation and rectal exam, the patients rectum was intubated and the Pentax Colonoscope [id number]  colonoscope was advanced under direct visualization to the cecum.  The scope was removed slowly by carefully examining the color, texture, anatomy, and integrity mucosa on the way out.  The patient was recovered in endoscopy and discharged home in satisfactory condition.       COLON FINDINGS: Seven sessile polyps ranging between 3-88mm in size were found in the transverse colon, descending colon, sigmoid colon, and rectum.  A polypectomy was performed with cold forceps and using snare cautery.  , A pedunculated polyp measuring 6 mm in size was found at the hepatic flexure.  A polypectomy was performed using snare cautery.  , Moderate diverticulosis was noted in the ascending colon.  , Mild diverticulosis was noted in the descending colon and sigmoid colon.  , Small internal hemorrhoids were found. , and PROBABLE LIPOMA IN TEH MID-TRANNSVERSE COLON.  TUNNEL BIOPSIES OBTAINED VIA COLD FORCEPS.  PREP QUALITY:  good. CECAL W/D TIME: 20 minutes  COMPLICATIONS: None  ENDOSCOPIC IMPRESSION: 1.   Seven sessile polyps ranging between 3-66mm in size were found in the transverse colon, descending colon, sigmoid colon, and rectum; polypectomy was performed with cold forceps and using snare cautery 2.   Pedunculated polyp measuring 6 mm in size was found at the hepatic flexure; polypectomy was performed using snare cautery 3.   Moderate diverticulosis was noted in the ascending colon 4.   Mild diverticulosis was noted in the descending colon and sigmoid colon 5.   Small internal hemorrhoids   RECOMMENDATIONS: AWAIT BIOPSY HIGH FIBER DIET HOLD ASA.  RESTART JAN 14. TCS IN 10-15 YEARS IF TEH BENEFITS OUTWEIGHT THE RISKS       _______________________________ Rosalie DoctorJonette Eva, MD 10/02/2012 10:55 AM     PATIENT NAME:  Nathan [name], Nathan [name] MR#: [id number]

## 2012-10-02 NOTE — H&P (Addendum)
  Primary Care Physician:  Avon Gully, MD Primary Gastroenterologist:  Dr. Darrick Penna  Pre-Procedure History & Physical: HPI:  Nathan [name] is a 77 y.o. male here for COLON CANCER SCREENING. HAS HR 39-NO CP/SOB/DIZZINESS. FOLLOWED BY DR. Dietrich Pates.  Past Medical History  Diagnosis Date  . Bradycardia     Evaluated by Dr. Nelda Severe in 2009  . Hypertension   . Carcinoma of prostate 2004  . Gout     Last symptomatic episode in 2006  . Hyperlipidemia     Lipid profile in 01/2012:203, 475, 37, X  . Tobacco abuse     0.5 pack per day; 30 pack years  . Auditory impairment     OS  . Systolic murmur     Consistent with mitral regurgitation  . Anemia, normocytic normochromic     H&H of 12.3/35.5 in 01/2012    Past Surgical History  Procedure Date  . Prostate surgery   . Bone spur     Prior to Admission medications   Medication Sig Start Date End Date Taking? Authorizing Provider  allopurinol (ZYLOPRIM) 300 MG tablet Take 300 mg by mouth daily.   Yes Historical Provider, MD  aspirin 81 MG tablet Take 81 mg by mouth daily.   Yes Historical Provider, MD  cholecalciferol (VITAMIN D) 1000 UNITS tablet Take 1,000 Units by mouth daily.   Yes Historical Provider, MD  lisinopril (PRINIVIL,ZESTRIL) 20 MG tablet Take 20 mg by mouth daily.   Yes Historical Provider, MD  sodium bicarbonate 650 MG tablet Take 650 mg by mouth 3 (three) times daily.   Yes Historical Provider, MD  simvastatin (ZOCOR) 40 MG tablet Take 1 tablet (40 mg total) by mouth at bedtime. 02/19/12 02/18/13  Kathlen Brunswick, MD    Allergies as of 09/09/2012  . (No Known Allergies)    Family History  Problem Relation Age of Onset  . Coronary artery disease Neg Hx   . Colon cancer Neg Hx   . Heart disease Mother   . Arrhythmia Mother     brother(brady)    History   Social History  . Marital Status: Married    Spouse Name: N/A    Number of Children: N/A  . Years of Education: N/A   Occupational History  . Retired  [employer]   Social History Main Topics  . Smoking status: Current Every Day Smoker -- 0.5 packs/day for 55 years  . Smokeless tobacco: Not on file  . Alcohol Use: 2.0 oz/week    4 drink(s) per week  . Drug Use: No  . Sexually Active: Not on file   Other Topics Concern  . Not on file   Social History Narrative  . No narrative on file    Review of Systems: See HPI, otherwise negative ROS   Physical Exam: BP 176/57  Pulse 40  Temp 97.9 F (36.6 C) (Oral)  Resp 15  Ht 5\' 9"  (1.753 m)  Wt 166 lb (75.297 kg)  BMI 24.51 kg/m2  SpO2 100% General:   Alert,  pleasant and cooperative in NAD Head:  Normocephalic and atraumatic. Neck:  Supple; Lungs:  Clear throughout to auscultation.    Heart:  Regular rate and rhythm. Abdomen:  Soft, nontender and nondistended. Normal bowel sounds, without guarding, and without rebound.   Neurologic:  Alert and  oriented x4;  grossly normal neurologically.  Impression/Plan:     SCREENING  Plan:  1. TCS TODAY

## 2012-10-02 NOTE — OR Nursing (Signed)
Dr. Darrick Penna notified of heartrate being 38-44. Patient was seen by Dr. Dietrich Pates last year for bradycardia. Patient denies symptoms of dizziness, fainting and fatigue. Patient states, "it's been slow for 10 years." Dr. Darrick Penna will give Atropine prior to the procedure.

## 2012-10-06 ENCOUNTER — Encounter (HOSPITAL_COMMUNITY): Payer: Self-pay | Admitting: Gastroenterology

## 2012-10-08 ENCOUNTER — Telehealth: Payer: Self-pay | Admitting: Gastroenterology

## 2012-10-08 NOTE — Telephone Encounter (Signed)
Path faxed to PCP, recall made 

## 2012-10-08 NOTE — Telephone Encounter (Signed)
Please call pt. HE had simple adenomas removed from HIS colon. FOLLOW A HIGH FIBER DIET. TCS IN 10-15 YEARS IF THE BENEFITS OUTWEIGH THE RISKS.

## 2012-10-08 NOTE — Telephone Encounter (Signed)
Called and informed pt.  

## 2012-12-03 DIAGNOSIS — I10 Essential (primary) hypertension: Secondary | ICD-10-CM | POA: Diagnosis not present

## 2012-12-17 DIAGNOSIS — D649 Anemia, unspecified: Secondary | ICD-10-CM | POA: Diagnosis not present

## 2012-12-17 DIAGNOSIS — N189 Chronic kidney disease, unspecified: Secondary | ICD-10-CM | POA: Diagnosis not present

## 2012-12-17 DIAGNOSIS — Z79899 Other long term (current) drug therapy: Secondary | ICD-10-CM | POA: Diagnosis not present

## 2012-12-17 DIAGNOSIS — E559 Vitamin D deficiency, unspecified: Secondary | ICD-10-CM | POA: Diagnosis not present

## 2012-12-21 DIAGNOSIS — M109 Gout, unspecified: Secondary | ICD-10-CM | POA: Diagnosis not present

## 2012-12-21 DIAGNOSIS — D649 Anemia, unspecified: Secondary | ICD-10-CM | POA: Diagnosis not present

## 2012-12-21 DIAGNOSIS — I10 Essential (primary) hypertension: Secondary | ICD-10-CM | POA: Diagnosis not present

## 2012-12-21 DIAGNOSIS — E559 Vitamin D deficiency, unspecified: Secondary | ICD-10-CM | POA: Insufficient documentation

## 2013-01-14 ENCOUNTER — Ambulatory Visit (INDEPENDENT_AMBULATORY_CARE_PROVIDER_SITE_OTHER): Payer: Medicare Other | Admitting: Cardiology

## 2013-01-14 ENCOUNTER — Encounter: Payer: Self-pay | Admitting: Cardiology

## 2013-01-14 VITALS — BP 118/80 | HR 40 | Ht 69.0 in | Wt 171.5 lb

## 2013-01-14 DIAGNOSIS — I498 Other specified cardiac arrhythmias: Secondary | ICD-10-CM

## 2013-01-14 DIAGNOSIS — R011 Cardiac murmur, unspecified: Secondary | ICD-10-CM

## 2013-01-14 DIAGNOSIS — N189 Chronic kidney disease, unspecified: Secondary | ICD-10-CM

## 2013-01-14 DIAGNOSIS — I10 Essential (primary) hypertension: Secondary | ICD-10-CM | POA: Diagnosis not present

## 2013-01-14 DIAGNOSIS — R001 Bradycardia, unspecified: Secondary | ICD-10-CM

## 2013-01-14 NOTE — Patient Instructions (Addendum)
Your physician wants you to follow-up in: 1 year. You will receive a reminder letter in the mail two months in advance. If you don't receive a letter, please call our office to schedule the follow-up appointment.  

## 2013-01-14 NOTE — Progress Notes (Signed)
Patient ID: Nathan [name], male   DOB: 20-Jan-1936, 77 y.o.   MRN: [id number]  HPI: Schedule return visit for this pleasant gentleman with asymptomatic sinus bradycardia. He has done well over the past year with no new medical problems and no need for urgent medical care. He has been referred to Dr. Kristian Covey for evaluation and treatment of chronic kidney disease, which is mild to moderate. He underwent colonoscopy during which bradycardia did not apparently present a significant problem.  Current Outpatient Prescriptions  Medication Sig Dispense Refill  . allopurinol (ZYLOPRIM) 300 MG tablet Take 300 mg by mouth daily.      . cholecalciferol (VITAMIN D) 1000 UNITS tablet Take 1,000 Units by mouth daily.      Marland Kitchen lisinopril (PRINIVIL,ZESTRIL) 20 MG tablet Take 20 mg by mouth daily.      . simvastatin (ZOCOR) 40 MG tablet Take 1 tablet (40 mg total) by mouth at bedtime.  90 tablet  3  . sodium bicarbonate 650 MG tablet Take 650 mg by mouth 3 (three) times daily.       No current facility-administered medications for this visit.    No Known Allergies   Past medical history, social history, and family history reviewed and updated.  ROS: Denies chest pain, dyspnea, orthopnea, PND, ankle edema, palpitations, lightheadedness or syncope. He suffered no falls. Colonoscopy revealed multiple polyps that were excised.  PHYSICAL EXAM: BP 118/80  Pulse 40  Ht 5\' 9"  (1.753 m)  Wt 77.792 kg (171 lb 8 oz)  BMI 25.31 kg/m2  SpO2 97%;  Body mass index is 25.31 kg/(m^2). General-Well developed; no acute distress Body habitus-proportionate weight and height Neck-No JVD; no carotid bruits Lungs-clear lung fields; resonant to percussion Cardiovascular-normal PMI; normal S1 and S2; modest basilar systolic murmur Abdomen-normal bowel sounds; soft and non-tender without masses or organomegaly Musculoskeletal-No deformities, no cyanosis or clubbing Neurologic-Normal cranial nerves; symmetric strength and  tone Skin-Warm, no significant lesions Extremities-distal pulses intact; no edema  EKG: Sinus bradycardia, rate of 36 bpm; sinus arrhythmia; right ventricular conduction delay; prominent U waves; nonspecific ST segment abnormality  Stockwell Bing, MD 01/14/2013  12:19 PM  ASSESSMENT AND PLAN

## 2013-01-14 NOTE — Assessment & Plan Note (Signed)
Sinus bradycardia is fairly impressive at this visit, but remains asymptomatic. Continued periodic reassessment is appropriate. Patient cautioned regarding the signs and symptoms of cerebral hypoperfusion and the need to call us immediately should these occur.

## 2013-01-14 NOTE — Progress Notes (Deleted)
Name: Nathan [name]    DOB: 04-23-1936  Age: 77 y.o.  MR#: [id number]       PCP:  Avon Gully, MD      Insurance: Payor: MEDICARE  Plan: MEDICARE PART A AND B  Product Type: *No Product type*    CC:   No chief complaint on file. list  VS Filed Vitals:   01/14/13 1202  BP: 118/80  Pulse: 40  Height: 5\' 9"  (1.753 m)  Weight: 171 lb 8 oz (77.792 kg)  SpO2: 97%    Weights Current Weight  01/14/13 171 lb 8 oz (77.792 kg)  10/02/12 166 lb (75.297 kg)  10/02/12 166 lb (75.297 kg)    Blood Pressure  BP Readings from Last 3 Encounters:  01/14/13 118/80  10/02/12 106/53  10/02/12 106/53     Admit date:  (Not on file) Last encounter with RMR:  Visit date not found   Allergy Review of patient's allergies indicates no known allergies.  Current Outpatient Prescriptions  Medication Sig Dispense Refill  . allopurinol (ZYLOPRIM) 300 MG tablet Take 300 mg by mouth daily.      . cholecalciferol (VITAMIN D) 1000 UNITS tablet Take 1,000 Units by mouth daily.      Marland Kitchen lisinopril (PRINIVIL,ZESTRIL) 20 MG tablet Take 20 mg by mouth daily.      . simvastatin (ZOCOR) 40 MG tablet Take 1 tablet (40 mg total) by mouth at bedtime.  90 tablet  3  . sodium bicarbonate 650 MG tablet Take 650 mg by mouth 3 (three) times daily.       No current facility-administered medications for this visit.    Discontinued Meds:   There are no discontinued medications.  Patient Active Problem List  Diagnosis  . Tobacco abuse  . Systolic murmur  . Bradycardia  . Hypertension  . Carcinoma of prostate  . Chronic kidney disease  . Anemia, normocytic normochromic  . Fasting hyperglycemia    LABS    Component Value Date/Time   NA 141 02/14/2012 0844   K 4.4 02/14/2012 0844   CL 109 02/14/2012 0844   CO2 22 02/14/2012 0844   GLUCOSE 97 02/14/2012 0844   BUN 32* 02/14/2012 0844   CREATININE 1.66* 02/14/2012 0844   CALCIUM 9.6 02/14/2012 0844   CMP     Component Value Date/Time   NA 141 02/14/2012 0844    K 4.4 02/14/2012 0844   CL 109 02/14/2012 0844   CO2 22 02/14/2012 0844   GLUCOSE 97 02/14/2012 0844   BUN 32* 02/14/2012 0844   CREATININE 1.66* 02/14/2012 0844   CALCIUM 9.6 02/14/2012 0844   PROT 7.2 02/14/2012 0844   ALBUMIN 4.6 02/14/2012 0844   AST 22 02/14/2012 0844   ALT 19 02/14/2012 0844   ALKPHOS 79 02/14/2012 0844   BILITOT 0.4 02/14/2012 0844       Component Value Date/Time   WBC 5.1 02/14/2012 0844   HGB 12.3* 02/14/2012 0844   HCT 35.5* 02/14/2012 0844   MCV 82.8 02/14/2012 0844    Lipid Panel     Component Value Date/Time   CHOL 136 03/23/2012 0908   TRIG 162* 03/23/2012 0908   HDL 36* 03/23/2012 0908   CHOLHDL 3.8 03/23/2012 0908   VLDL 32 03/23/2012 0908   LDLCALC 68 03/23/2012 0908    ABG No results found for this basename: phart, pco2, pco2art, po2, po2art, hco3, tco2, acidbasedef, o2sat     Lab Results  Component Value Date   TSH 2.797 02/14/2012  BNP (last 3 results) No results found for this basename: PROBNP,  in the last 8760 hours Cardiac Panel (last 3 results) No results found for this basename: CKTOTAL, CKMB, TROPONINI, RELINDX,  in the last 72 hours  Iron/TIBC/Ferritin No results found for this basename: iron, tibc, ferritin     EKG Orders placed in visit on 01/14/13  . EKG 12-LEAD     Prior Assessment and Plan Problem List as of 01/14/2013     ICD-9-CM   Tobacco abuse   Last Assessment & Plan   01/15/2012 Office Visit Edited 01/18/2012 10:08 AM by Kathlen Brunswick, MD     Patient advised to discontinue use of tobacco products.     Systolic murmur   Last Assessment & Plan   01/15/2012 Office Visit Written 01/18/2012 10:01 AM by Kathlen Brunswick, MD     Blood pressure is somewhat elevated at this visit.  Additional determinations will be collected by the patient and return to our office.    Bradycardia   Last Assessment & Plan   01/15/2012 Office Visit Written 01/18/2012 10:00 AM by Kathlen Brunswick, MD     Mr. Vandunk remains asymptomatic with  respect to sinus bradycardia.  Previous EKG included a computerized interpretation of atrial fibrillation, which was in error.  In the absence of more significant arrhythmias, no further attention is required to his conduction system unless and until he developed symptoms.    Hypertension   Carcinoma of prostate   Chronic kidney disease   Anemia, normocytic normochromic   Fasting hyperglycemia       Imaging: No results found.

## 2013-01-14 NOTE — Assessment & Plan Note (Signed)
Murmur has benign characteristics; echocardiography has not been performed to date nor is imaging necessary at present in the absence of symptoms.

## 2013-01-14 NOTE — Assessment & Plan Note (Addendum)
Blood pressure now appears to be under excellent control with a single antihypertensive agent, which will be continued.

## 2013-01-16 ENCOUNTER — Encounter: Payer: Self-pay | Admitting: Cardiology

## 2013-03-04 DIAGNOSIS — I10 Essential (primary) hypertension: Secondary | ICD-10-CM | POA: Diagnosis not present

## 2013-03-04 DIAGNOSIS — C61 Malignant neoplasm of prostate: Secondary | ICD-10-CM | POA: Diagnosis not present

## 2013-03-04 DIAGNOSIS — E7800 Pure hypercholesterolemia, unspecified: Secondary | ICD-10-CM | POA: Diagnosis not present

## 2013-04-29 DIAGNOSIS — D649 Anemia, unspecified: Secondary | ICD-10-CM | POA: Diagnosis not present

## 2013-04-29 DIAGNOSIS — N189 Chronic kidney disease, unspecified: Secondary | ICD-10-CM | POA: Diagnosis not present

## 2013-04-29 DIAGNOSIS — Z79899 Other long term (current) drug therapy: Secondary | ICD-10-CM | POA: Diagnosis not present

## 2013-04-29 DIAGNOSIS — R809 Proteinuria, unspecified: Secondary | ICD-10-CM | POA: Diagnosis not present

## 2013-04-29 DIAGNOSIS — E559 Vitamin D deficiency, unspecified: Secondary | ICD-10-CM | POA: Diagnosis not present

## 2013-05-04 DIAGNOSIS — D649 Anemia, unspecified: Secondary | ICD-10-CM | POA: Diagnosis not present

## 2013-05-04 DIAGNOSIS — M109 Gout, unspecified: Secondary | ICD-10-CM | POA: Diagnosis not present

## 2013-05-04 DIAGNOSIS — E559 Vitamin D deficiency, unspecified: Secondary | ICD-10-CM | POA: Diagnosis not present

## 2013-06-03 DIAGNOSIS — I10 Essential (primary) hypertension: Secondary | ICD-10-CM | POA: Diagnosis not present

## 2013-06-03 DIAGNOSIS — Z23 Encounter for immunization: Secondary | ICD-10-CM | POA: Diagnosis not present

## 2013-06-03 DIAGNOSIS — N19 Unspecified kidney failure: Secondary | ICD-10-CM | POA: Diagnosis not present

## 2013-09-01 DIAGNOSIS — C61 Malignant neoplasm of prostate: Secondary | ICD-10-CM | POA: Diagnosis not present

## 2013-09-03 DIAGNOSIS — N19 Unspecified kidney failure: Secondary | ICD-10-CM | POA: Diagnosis not present

## 2013-09-03 DIAGNOSIS — I10 Essential (primary) hypertension: Secondary | ICD-10-CM | POA: Diagnosis not present

## 2013-09-06 DIAGNOSIS — I10 Essential (primary) hypertension: Secondary | ICD-10-CM | POA: Diagnosis not present

## 2013-09-06 DIAGNOSIS — N19 Unspecified kidney failure: Secondary | ICD-10-CM | POA: Diagnosis not present

## 2013-09-08 DIAGNOSIS — C61 Malignant neoplasm of prostate: Secondary | ICD-10-CM | POA: Diagnosis not present

## 2013-12-03 DIAGNOSIS — C61 Malignant neoplasm of prostate: Secondary | ICD-10-CM | POA: Diagnosis not present

## 2013-12-03 DIAGNOSIS — I10 Essential (primary) hypertension: Secondary | ICD-10-CM | POA: Diagnosis not present

## 2013-12-03 DIAGNOSIS — N19 Unspecified kidney failure: Secondary | ICD-10-CM | POA: Diagnosis not present

## 2014-01-17 ENCOUNTER — Encounter: Payer: Self-pay | Admitting: Cardiology

## 2014-01-17 ENCOUNTER — Ambulatory Visit (INDEPENDENT_AMBULATORY_CARE_PROVIDER_SITE_OTHER): Payer: Medicare Other | Admitting: Cardiology

## 2014-01-17 VITALS — BP 150/55 | HR 40 | Ht 69.0 in | Wt 165.0 lb

## 2014-01-17 DIAGNOSIS — R001 Bradycardia, unspecified: Secondary | ICD-10-CM

## 2014-01-17 DIAGNOSIS — I498 Other specified cardiac arrhythmias: Secondary | ICD-10-CM | POA: Diagnosis not present

## 2014-01-17 DIAGNOSIS — I10 Essential (primary) hypertension: Secondary | ICD-10-CM

## 2014-01-17 NOTE — Progress Notes (Signed)
Clinical Summary Mr. Makarewicz is a 78 y.o.male former patient of Dr Lattie Haw, this is our first visit together. He is seen for the following medical problems.  1. Sinus bradycardia - long history of sinus brady that has beenasymptomatic per prior notes - TSH 01/2012 2.8 - denies any lightheadedness or dizziness. Works regularly in his yard without troubles.  2. HTN - does not check at home - compliant with meds  3. Hyperlipidemia - compliant with simvastatin, takes nightly - followed by pcp, no recent panel in our system    Past Medical History  Diagnosis Date  . Bradycardia     Evaluated by Dr. Cletus Gash in 2009  . Hypertension   . Carcinoma of prostate 2004  . Gout     Last symptomatic episode in 2006  . Hyperlipidemia     Lipid profile in 01/2012:203, 475, 37, X  . Tobacco abuse     0.5 pack per day; 30 pack years  . Auditory impairment     OS  . Systolic murmur     Consistent with mitral regurgitation  . Anemia, normocytic normochromic     H&H of 12.3/35.5 in 01/2012     No Known Allergies   Current Outpatient Prescriptions  Medication Sig Dispense Refill  . allopurinol (ZYLOPRIM) 300 MG tablet Take 300 mg by mouth daily.      . cholecalciferol (VITAMIN D) 1000 UNITS tablet Take 1,000 Units by mouth daily.      Marland Kitchen lisinopril (PRINIVIL,ZESTRIL) 20 MG tablet Take 20 mg by mouth daily.      . simvastatin (ZOCOR) 40 MG tablet Take 1 tablet (40 mg total) by mouth at bedtime.  90 tablet  3  . sodium bicarbonate 650 MG tablet Take 650 mg by mouth 3 (three) times daily.       No current facility-administered medications for this visit.     Past Surgical History  Procedure Laterality Date  . Prostate surgery    . Bone spur    . Colonoscopy  10/02/2012    Procedure: COLONOSCOPY;  Surgeon: Danie Binder, MD;  Location: [facility];  Service: Endoscopy;  Laterality: N/A;  10:30 AM     No Known Allergies    Family History  Problem Relation Age of Onset    . Coronary artery disease Neg Hx   . Colon cancer Neg Hx   . Heart disease Mother   . Arrhythmia Mother     brother(brady)     Social History Mr. Rindfleisch reports that he has been smoking.  He does not have any smokeless tobacco history on file. Mr. Beezley reports that he drinks about 2 ounces of alcohol per week.   Review of Systems CONSTITUTIONAL: No weight loss, fever, chills, weakness or fatigue.  HEENT: Eyes: No visual loss, blurred vision, double vision or yellow sclerae.No hearing loss, sneezing, congestion, runny nose or sore throat.  SKIN: No rash or itching.  CARDIOVASCULAR: per HPI RESPIRATORY: No shortness of breath, cough or sputum.  GASTROINTESTINAL: No anorexia, nausea, vomiting or diarrhea. No abdominal pain or blood.  GENITOURINARY: No burning on urination, no polyuria NEUROLOGICAL: No headache, dizziness, syncope, paralysis, ataxia, numbness or tingling in the extremities. No change in bowel or bladder control.  MUSCULOSKELETAL: No muscle, back pain, joint pain or stiffness.  LYMPHATICS: No enlarged nodes. No history of splenectomy.  PSYCHIATRIC: No history of depression or anxiety.  ENDOCRINOLOGIC: No reports of sweating, cold or heat intolerance. No polyuria or polydipsia.  Marland Kitchen  Physical Examination p 40 bp 150/70 Wt 165 lbs BMI 24 Gen: resting comfortably, no acute distress HEENT: no scleral icterus, pupils equal round and reactive, no palptable cervical adenopathy,  CV: regular, brady to 40, no m/r/g, no JVD Resp: Clear to auscultation bilaterally GI: abdomen is soft, non-tender, non-distended, normal bowel sounds, no hepatosplenomegaly MSK: extremities are warm, no edema.  Skin: warm, no rash Neuro:  no focal deficits Psych: appropriate affect   Diagnostic Studies  Clinic EKG: sinus bradycardia, prominent u-waves   Assessment and Plan  1. Sinus bradycardia - chronic and asymptomatic, continue to follow  2. HTN - elevated in clinic today -  he will keep bp log x 2 weeks and bring to our office, medication changes pending that data  3. Hyperlipidemia - followed by pcp, will request most recent panel   F/u 1 year   Arnoldo Lenis, M.D., F.A.C.C.

## 2014-01-17 NOTE — Patient Instructions (Addendum)
Your physician wants you to follow-up in: 1 year You will receive a reminder letter in the mail two months in advance. If you don't receive a letter, please call our office to schedule the follow-up appointment.   Your physician recommends that you continue on your current medications as directed. Please refer to the Current Medication list given to you today.  Please record blood pressures on log given to you and return in 2 weeks    Thank you for choosing [facility] !

## 2014-01-18 ENCOUNTER — Encounter: Payer: Self-pay | Admitting: Cardiology

## 2014-01-18 NOTE — Addendum Note (Signed)
Addended by: Barbarann Ehlers A on: 01/18/2014 10:00 AM   Modules accepted: Orders

## 2014-02-01 ENCOUNTER — Telehealth: Payer: Self-pay

## 2014-02-01 NOTE — Telephone Encounter (Signed)
Patient dropped off BP records,placed on Dr.branch's desk in file folder for review

## 2014-02-07 NOTE — Telephone Encounter (Signed)
Spoke with pt, message relayed.

## 2014-02-07 NOTE — Telephone Encounter (Signed)
Message copied by Bernita Raisin on Mon Feb 07, 2014  2:51 PM ------      Message from: [place] F      Created: Mon Feb 07, 2014  2:49 PM       Please let patient know that overall bp log looks ok, continue current meds ------

## 2014-03-11 DIAGNOSIS — H918X9 Other specified hearing loss, unspecified ear: Secondary | ICD-10-CM | POA: Diagnosis not present

## 2014-03-11 DIAGNOSIS — I10 Essential (primary) hypertension: Secondary | ICD-10-CM | POA: Diagnosis not present

## 2014-03-11 DIAGNOSIS — C61 Malignant neoplasm of prostate: Secondary | ICD-10-CM | POA: Diagnosis not present

## 2014-04-14 ENCOUNTER — Ambulatory Visit (INDEPENDENT_AMBULATORY_CARE_PROVIDER_SITE_OTHER): Payer: Medicare Other | Admitting: Otolaryngology

## 2014-04-14 DIAGNOSIS — H903 Sensorineural hearing loss, bilateral: Secondary | ICD-10-CM | POA: Diagnosis not present

## 2014-04-14 DIAGNOSIS — H9120 Sudden idiopathic hearing loss, unspecified ear: Secondary | ICD-10-CM

## 2014-04-14 DIAGNOSIS — H9319 Tinnitus, unspecified ear: Secondary | ICD-10-CM

## 2014-04-28 ENCOUNTER — Ambulatory Visit (INDEPENDENT_AMBULATORY_CARE_PROVIDER_SITE_OTHER): Payer: Medicare Other | Admitting: Otolaryngology

## 2014-04-28 DIAGNOSIS — H9319 Tinnitus, unspecified ear: Secondary | ICD-10-CM | POA: Diagnosis not present

## 2014-04-28 DIAGNOSIS — H903 Sensorineural hearing loss, bilateral: Secondary | ICD-10-CM

## 2014-06-10 DIAGNOSIS — N19 Unspecified kidney failure: Secondary | ICD-10-CM | POA: Diagnosis not present

## 2014-06-10 DIAGNOSIS — I10 Essential (primary) hypertension: Secondary | ICD-10-CM | POA: Diagnosis not present

## 2014-06-10 DIAGNOSIS — E785 Hyperlipidemia, unspecified: Secondary | ICD-10-CM | POA: Diagnosis not present

## 2014-06-10 DIAGNOSIS — Z23 Encounter for immunization: Secondary | ICD-10-CM | POA: Diagnosis not present

## 2014-06-10 DIAGNOSIS — Z125 Encounter for screening for malignant neoplasm of prostate: Secondary | ICD-10-CM | POA: Diagnosis not present

## 2014-06-10 DIAGNOSIS — C61 Malignant neoplasm of prostate: Secondary | ICD-10-CM | POA: Diagnosis not present

## 2014-06-10 DIAGNOSIS — R799 Abnormal finding of blood chemistry, unspecified: Secondary | ICD-10-CM | POA: Diagnosis not present

## 2014-06-10 DIAGNOSIS — N182 Chronic kidney disease, stage 2 (mild): Secondary | ICD-10-CM | POA: Diagnosis not present

## 2014-07-28 DIAGNOSIS — H903 Sensorineural hearing loss, bilateral: Secondary | ICD-10-CM | POA: Diagnosis not present

## 2014-08-01 DIAGNOSIS — Z0000 Encounter for general adult medical examination without abnormal findings: Secondary | ICD-10-CM | POA: Diagnosis not present

## 2014-09-09 DIAGNOSIS — I10 Essential (primary) hypertension: Secondary | ICD-10-CM | POA: Diagnosis not present

## 2014-09-09 DIAGNOSIS — N182 Chronic kidney disease, stage 2 (mild): Secondary | ICD-10-CM | POA: Diagnosis not present

## 2014-10-21 DIAGNOSIS — I10 Essential (primary) hypertension: Secondary | ICD-10-CM | POA: Diagnosis not present

## 2014-10-21 DIAGNOSIS — N182 Chronic kidney disease, stage 2 (mild): Secondary | ICD-10-CM | POA: Diagnosis not present

## 2014-11-14 DIAGNOSIS — D649 Anemia, unspecified: Secondary | ICD-10-CM | POA: Diagnosis not present

## 2014-11-14 DIAGNOSIS — Z79899 Other long term (current) drug therapy: Secondary | ICD-10-CM | POA: Diagnosis not present

## 2014-11-14 DIAGNOSIS — E559 Vitamin D deficiency, unspecified: Secondary | ICD-10-CM | POA: Diagnosis not present

## 2014-11-14 DIAGNOSIS — R809 Proteinuria, unspecified: Secondary | ICD-10-CM | POA: Diagnosis not present

## 2014-11-14 DIAGNOSIS — N183 Chronic kidney disease, stage 3 (moderate): Secondary | ICD-10-CM | POA: Diagnosis not present

## 2014-11-14 DIAGNOSIS — I10 Essential (primary) hypertension: Secondary | ICD-10-CM | POA: Diagnosis not present

## 2014-11-18 DIAGNOSIS — N182 Chronic kidney disease, stage 2 (mild): Secondary | ICD-10-CM | POA: Diagnosis not present

## 2014-11-18 DIAGNOSIS — I10 Essential (primary) hypertension: Secondary | ICD-10-CM | POA: Diagnosis not present

## 2014-11-21 DIAGNOSIS — R809 Proteinuria, unspecified: Secondary | ICD-10-CM | POA: Diagnosis not present

## 2014-11-21 DIAGNOSIS — D649 Anemia, unspecified: Secondary | ICD-10-CM | POA: Diagnosis not present

## 2014-11-21 DIAGNOSIS — N183 Chronic kidney disease, stage 3 (moderate): Secondary | ICD-10-CM | POA: Diagnosis not present

## 2014-11-21 DIAGNOSIS — M109 Gout, unspecified: Secondary | ICD-10-CM | POA: Diagnosis not present

## 2014-11-21 DIAGNOSIS — I10 Essential (primary) hypertension: Secondary | ICD-10-CM | POA: Diagnosis not present

## 2014-11-21 DIAGNOSIS — N2581 Secondary hyperparathyroidism of renal origin: Secondary | ICD-10-CM | POA: Diagnosis not present

## 2014-11-21 DIAGNOSIS — E559 Vitamin D deficiency, unspecified: Secondary | ICD-10-CM | POA: Diagnosis not present

## 2014-11-25 ENCOUNTER — Ambulatory Visit (INDEPENDENT_AMBULATORY_CARE_PROVIDER_SITE_OTHER): Payer: Medicare Other | Admitting: Urology

## 2014-11-25 DIAGNOSIS — Z8546 Personal history of malignant neoplasm of prostate: Secondary | ICD-10-CM

## 2014-11-25 DIAGNOSIS — N393 Stress incontinence (female) (male): Secondary | ICD-10-CM | POA: Diagnosis not present

## 2014-11-25 DIAGNOSIS — N5231 Erectile dysfunction following radical prostatectomy: Secondary | ICD-10-CM | POA: Diagnosis not present

## 2014-12-30 ENCOUNTER — Encounter: Payer: Self-pay | Admitting: Cardiology

## 2014-12-30 ENCOUNTER — Ambulatory Visit (INDEPENDENT_AMBULATORY_CARE_PROVIDER_SITE_OTHER): Payer: Medicare Other | Admitting: Cardiology

## 2014-12-30 VITALS — BP 142/66 | HR 54 | Ht 69.0 in | Wt 164.0 lb

## 2014-12-30 DIAGNOSIS — R001 Bradycardia, unspecified: Secondary | ICD-10-CM

## 2014-12-30 DIAGNOSIS — I10 Essential (primary) hypertension: Secondary | ICD-10-CM

## 2014-12-30 NOTE — Patient Instructions (Signed)
Your physician wants you to follow-up in: 1 year with DrBranch You will receive a reminder letter in the mail two months in advance. If you don't receive a letter, please call our office to schedule the follow-up appointment.     Your physician recommends that you continue on your current medications as directed. Please refer to the Current Medication list given to you today.      Thank you for choosing [facility] !        

## 2014-12-30 NOTE — Progress Notes (Addendum)
Clinical Summary Nathan [name] is a 79 y.o.male seen today for follow up of the following medical problems.   1. Sinus bradycardia - long history of sinus brady that has been asymptomatic per prior notes. Thyroid studies have been normal, has not been on AV nodal agents - denies any lightheadedness or dizziness.  2. HTN - does not check at home - compliant with meds  3. Hyperlipidemia - compliant with simvastatin, takes nightly - followed by pcp, no recent panel in our system   Past Medical History  Diagnosis Date  . Bradycardia     Evaluated by Dr. Cletus Gash in 2009  . Hypertension   . Carcinoma of prostate 2004  . Gout     Last symptomatic episode in 2006  . Hyperlipidemia     Lipid profile in 01/2012:203, 475, 37, X  . Tobacco abuse     0.5 pack per day; 30 pack years  . Auditory impairment     OS  . Systolic murmur     Consistent with mitral regurgitation  . Anemia, normocytic normochromic     H&H of 12.3/35.5 in 01/2012     No Known Allergies   Current Outpatient Prescriptions  Medication Sig Dispense Refill  . allopurinol (ZYLOPRIM) 300 MG tablet Take 300 mg by mouth daily.    Marland Kitchen aspirin EC 81 MG tablet Take 81 mg by mouth daily.    Marland Kitchen lisinopril (PRINIVIL,ZESTRIL) 20 MG tablet Take 20 mg by mouth daily.    . simvastatin (ZOCOR) 40 MG tablet Take 40 mg by mouth daily.    . sodium bicarbonate 650 MG tablet Take 650 mg by mouth 3 (three) times daily.     No current facility-administered medications for this visit.     Past Surgical History  Procedure Laterality Date  . Prostate surgery    . Bone spur    . Colonoscopy  10/02/2012    Procedure: COLONOSCOPY;  Surgeon: Danie Binder, MD;  Location: [facility];  Service: Endoscopy;  Laterality: N/A;  10:30 AM     No Known Allergies    Family History  Problem Relation Age of Onset  . Coronary artery disease Neg Hx   . Colon cancer Neg Hx   . Heart disease Mother   . Arrhythmia Mother    brother(brady)     Social History Nathan [name] reports that he has been smoking.  He does not have any smokeless tobacco history on file. Nathan [name] reports that he drinks about 2.0 oz of alcohol per week.   Review of Systems CONSTITUTIONAL: No weight loss, fever, chills, weakness or fatigue.  HEENT: Eyes: No visual loss, blurred vision, double vision or yellow sclerae.No hearing loss, sneezing, congestion, runny nose or sore throat.  SKIN: No rash or itching.  CARDIOVASCULAR: per HPI RESPIRATORY: No shortness of breath, cough or sputum.  GASTROINTESTINAL: No anorexia, nausea, vomiting or diarrhea. No abdominal pain or blood.  GENITOURINARY: No burning on urination, no polyuria NEUROLOGICAL: No headache, dizziness, syncope, paralysis, ataxia, numbness or tingling in the extremities. No change in bowel or bladder control.  MUSCULOSKELETAL: No muscle, back pain, joint pain or stiffness.  LYMPHATICS: No enlarged nodes. No history of splenectomy.  PSYCHIATRIC: No history of depression or anxiety.  ENDOCRINOLOGIC: No reports of sweating, cold or heat intolerance. No polyuria or polydipsia.  Marland Kitchen   Physical Examination p 54 bp 142/66 Wt 164 lbs BMI 24 Gen: resting comfortably, no acute distress HEENT: no scleral icterus, pupils  equal round and reactive, no palptable cervical adenopathy,  CV: RRR, no m/r/g, no JVD, no carotid bruits Resp: Clear to auscultation bilaterally GI: abdomen is soft, non-tender, non-distended, normal bowel sounds, no hepatosplenomegaly MSK: extremities are warm, no edema.  Skin: warm, no rash Neuro:  no focal deficits Psych: appropriate affect     Assessment and Plan  1. Sinus bradycardia - chronic and asymptomatic, continue to follow  2. HTN - at goal, continue current meds  3. Hyperlipidemia - followed by pcp, defer management   F/u 1 year    Nathan [name], M.D.

## 2015-05-01 DIAGNOSIS — I10 Essential (primary) hypertension: Secondary | ICD-10-CM | POA: Diagnosis not present

## 2015-05-01 DIAGNOSIS — R809 Proteinuria, unspecified: Secondary | ICD-10-CM | POA: Diagnosis not present

## 2015-05-01 DIAGNOSIS — D649 Anemia, unspecified: Secondary | ICD-10-CM | POA: Diagnosis not present

## 2015-05-01 DIAGNOSIS — E559 Vitamin D deficiency, unspecified: Secondary | ICD-10-CM | POA: Diagnosis not present

## 2015-05-01 DIAGNOSIS — M109 Gout, unspecified: Secondary | ICD-10-CM | POA: Diagnosis not present

## 2015-05-01 DIAGNOSIS — N183 Chronic kidney disease, stage 3 (moderate): Secondary | ICD-10-CM | POA: Diagnosis not present

## 2015-05-01 DIAGNOSIS — N2581 Secondary hyperparathyroidism of renal origin: Secondary | ICD-10-CM | POA: Diagnosis not present

## 2015-05-04 ENCOUNTER — Ambulatory Visit (INDEPENDENT_AMBULATORY_CARE_PROVIDER_SITE_OTHER): Payer: Medicare Other | Admitting: Otolaryngology

## 2015-05-04 DIAGNOSIS — H903 Sensorineural hearing loss, bilateral: Secondary | ICD-10-CM | POA: Diagnosis not present

## 2015-09-13 DIAGNOSIS — D649 Anemia, unspecified: Secondary | ICD-10-CM | POA: Diagnosis not present

## 2015-09-13 DIAGNOSIS — E559 Vitamin D deficiency, unspecified: Secondary | ICD-10-CM | POA: Diagnosis not present

## 2015-09-13 DIAGNOSIS — M109 Gout, unspecified: Secondary | ICD-10-CM | POA: Diagnosis not present

## 2015-09-13 DIAGNOSIS — I10 Essential (primary) hypertension: Secondary | ICD-10-CM | POA: Diagnosis not present

## 2015-09-13 DIAGNOSIS — N2581 Secondary hyperparathyroidism of renal origin: Secondary | ICD-10-CM | POA: Diagnosis not present

## 2015-09-13 DIAGNOSIS — R809 Proteinuria, unspecified: Secondary | ICD-10-CM | POA: Diagnosis not present

## 2015-09-13 DIAGNOSIS — N183 Chronic kidney disease, stage 3 (moderate): Secondary | ICD-10-CM | POA: Diagnosis not present

## 2016-01-04 ENCOUNTER — Ambulatory Visit (INDEPENDENT_AMBULATORY_CARE_PROVIDER_SITE_OTHER): Payer: Medicare Other | Admitting: Cardiology

## 2016-01-04 VITALS — BP 134/64 | HR 48 | Ht 65.0 in | Wt 163.0 lb

## 2016-01-04 DIAGNOSIS — I10 Essential (primary) hypertension: Secondary | ICD-10-CM | POA: Diagnosis not present

## 2016-01-04 DIAGNOSIS — E785 Hyperlipidemia, unspecified: Secondary | ICD-10-CM | POA: Diagnosis not present

## 2016-01-04 DIAGNOSIS — R001 Bradycardia, unspecified: Secondary | ICD-10-CM

## 2016-01-04 NOTE — Progress Notes (Signed)
Patient ID: Nathan [name], male   DOB: 1936-09-03, 80 y.o.   MRN: [id number]     Clinical Summary Nathan [name] is a 80 y.o.male  seen today for follow up of the following medical problems.   1. Sinus bradycardia - long history of sinus brady that has been asymptomatic per prior notes. Thyroid studies have been normal, has not been on AV nodal agents  - denies any lightheadedness or dizziness since last visit. No significant fatigue. Remains active at home, does regular yard work without any limitations.    2. HTN - does not check at home regularly - compliant with meds  3. Hyperlipidemia - compliant with simvastatin Past Medical History  Diagnosis Date  . Bradycardia     Evaluated by Dr. Cletus Gash in 2009  . Hypertension   . Carcinoma of prostate 2004  . Gout     Last symptomatic episode in 2006  . Hyperlipidemia     Lipid profile in 01/2012:203, 475, 37, X  . Tobacco abuse     0.5 pack per day; 30 pack years  . Auditory impairment     OS  . Systolic murmur     Consistent with mitral regurgitation  . Anemia, normocytic normochromic     H&H of 12.3/35.5 in 01/2012     No Known Allergies   Current Outpatient Prescriptions  Medication Sig Dispense Refill  . allopurinol (ZYLOPRIM) 300 MG tablet Take 300 mg by mouth daily.    Marland Kitchen aspirin EC 81 MG tablet Take 81 mg by mouth daily.    Marland Kitchen lisinopril (PRINIVIL,ZESTRIL) 20 MG tablet Take 20 mg by mouth daily.    . simvastatin (ZOCOR) 40 MG tablet Take 40 mg by mouth daily.    . sodium bicarbonate 650 MG tablet Take 650 mg by mouth 3 (three) times daily.     No current facility-administered medications for this visit.     Past Surgical History  Procedure Laterality Date  . Prostate surgery    . Bone spur    . Colonoscopy  10/02/2012    Procedure: COLONOSCOPY;  Surgeon: Danie Binder, MD;  Location[facility];  Service: Endoscopy;  Laterality: N/A;  10:30 AM     No Known Allergies    Family History  Problem  Relation Age of Onset  . Coronary artery disease Neg Hx   . Colon cancer Neg Hx   . Heart disease Mother   . Arrhythmia Mother     brother(brady)     Social History [name] reports that he has been smoking Cigarettes.  He started smoking about 58 years ago. He has a 27.5 pack-year smoking history. He has never used smokeless tobacco. [name] reports that he drinks about 2.4 oz of alcohol per week.   Review of Systems CONSTITUTIONAL: No weight loss, fever, chills, weakness or fatigue.  HEENT: Eyes: No visual loss, blurred vision, double vision or yellow sclerae.No hearing loss, sneezing, congestion, runny nose or sore throat.  SKIN: No rash or itching.  CARDIOVASCULAR: per HPI RESPIRATORY: No shortness of breath, cough or sputum.  GASTROINTESTINAL: No anorexia, nausea, vomiting or diarrhea. No abdominal pain or blood.  GENITOURINARY: No burning on urination, no polyuria NEUROLOGICAL: No headache, dizziness, syncope, paralysis, ataxia, numbness or tingling in the extremities. No change in bowel or bladder control.  MUSCULOSKELETAL: No muscle, back pain, joint pain or stiffness.  LYMPHATICS: No enlarged nodes. No history of splenectomy.  PSYCHIATRIC: No history of depression or anxiety.  ENDOCRINOLOGIC:  No reports of sweating, cold or heat intolerance. No polyuria or polydipsia.  Marland Kitchen   Physical Examination Filed Vitals:   01/04/16 0818  BP: 134/64  Pulse: 48   Filed Vitals:   01/04/16 0818  Height: 5\' 5"  (1.651 m)  Weight: 163 lb (73.936 kg)    Gen: resting comfortably, no acute distress HEENT: no scleral icterus, pupils equal round and reactive, no palptable cervical adenopathy,  CV: regular, rate 50, no m/r/g, no jvd Resp: Clear to auscultation bilaterally GI: abdomen is soft, non-tender, non-distended, normal bowel sounds, no hepatosplenomegaly MSK: extremities are warm, no edema.  Skin: warm, no rash Neuro:  no focal deficits Psych: appropriate  affect     Assessment and Plan   1. Sinus bradycardia -long history of sinus bradycardia, he has been asymptomatic. EKG shows sinus bradycardia in clinic today. - continue to monitor   2. HTN - at goal, we will continue current meds  3. Hyperlipidemia - request labs from pcp - continue statin  F/u 6 months   Arnoldo Lenis, M.D.

## 2016-01-04 NOTE — Patient Instructions (Signed)
Your physician wants you to follow-up in: 6 months You will receive a reminder letter in the mail two months in advance. If you don't receive a letter, please call our office to schedule the follow-up appointment.     Your physician recommends that you continue on your current medications as directed. Please refer to the Current Medication list given to you today.      Thank you for choosing [facility] !        

## 2016-01-06 ENCOUNTER — Encounter: Payer: Self-pay | Admitting: Cardiology

## 2016-02-17 ENCOUNTER — Emergency Department (HOSPITAL_COMMUNITY): Payer: Medicare Other

## 2016-02-17 ENCOUNTER — Emergency Department (HOSPITAL_COMMUNITY)
Admission: EM | Admit: 2016-02-17 | Discharge: 2016-02-17 | Disposition: A | Discharge status: Home / Self Care | Payer: Medicare Other | Attending: Emergency Medicine | Admitting: Emergency Medicine

## 2016-02-17 ENCOUNTER — Encounter (HOSPITAL_COMMUNITY): Payer: Self-pay

## 2016-02-17 DIAGNOSIS — Z7982 Long term (current) use of aspirin: Secondary | ICD-10-CM | POA: Diagnosis not present

## 2016-02-17 DIAGNOSIS — Y999 Unspecified external cause status: Secondary | ICD-10-CM | POA: Insufficient documentation

## 2016-02-17 DIAGNOSIS — F17210 Nicotine dependence, cigarettes, uncomplicated: Secondary | ICD-10-CM | POA: Diagnosis not present

## 2016-02-17 DIAGNOSIS — I10 Essential (primary) hypertension: Secondary | ICD-10-CM | POA: Diagnosis not present

## 2016-02-17 DIAGNOSIS — X501XXA Overexertion from prolonged static or awkward postures, initial encounter: Secondary | ICD-10-CM | POA: Diagnosis not present

## 2016-02-17 DIAGNOSIS — Y939 Activity, unspecified: Secondary | ICD-10-CM | POA: Diagnosis not present

## 2016-02-17 DIAGNOSIS — E785 Hyperlipidemia, unspecified: Secondary | ICD-10-CM | POA: Insufficient documentation

## 2016-02-17 DIAGNOSIS — Y929 Unspecified place or not applicable: Secondary | ICD-10-CM | POA: Diagnosis not present

## 2016-02-17 DIAGNOSIS — M545 Low back pain: Secondary | ICD-10-CM | POA: Diagnosis present

## 2016-02-17 DIAGNOSIS — S39012A Strain of muscle, fascia and tendon of lower back, initial encounter: Secondary | ICD-10-CM | POA: Insufficient documentation

## 2016-02-17 DIAGNOSIS — Z79899 Other long term (current) drug therapy: Secondary | ICD-10-CM | POA: Insufficient documentation

## 2016-02-17 MED ORDER — HYDROCODONE-ACETAMINOPHEN 5-325 MG PO TABS: 1.0000 | ORAL_TABLET | Freq: Once | ORAL | Status: DC

## 2016-02-17 MED ORDER — ACETAMINOPHEN 325 MG PO TABS
650.0000 mg | ORAL_TABLET | Freq: Once | ORAL | Status: AC
  Administered 2016-02-17: 650 mg via ORAL
  Filled 2016-02-17: qty 2

## 2016-02-17 NOTE — Discharge Instructions (Signed)
Take over the counter tylenol, as directed on packaging, as needed for discomfort. Apply moist heat or ice to the area(s) of discomfort, for 15 minutes at a time, several times per day for the next few days.  Do not fall asleep on a heating or ice pack.  Call your regular medical doctor on Monday to schedule a follow up appointment this week.  Return to the Emergency Department immediately if worsening. ° °

## 2016-02-17 NOTE — ED Notes (Signed)
Pt placed on cardiac monitor, pt hr 38-42, pt reports this is normal (for a few years), no other symptoms reported.

## 2016-02-17 NOTE — ED Notes (Signed)
Dr. Rogene Houston aware of pt hr.

## 2016-02-17 NOTE — ED Provider Notes (Signed)
CSN: [id number]     Arrival date & time 02/17/16  1528 History   First MD Initiated Contact with Patient 02/17/16 1552     Chief Complaint  Patient presents with  . Back Pain     HPI Pt was seen at 1555. Per pt, c/o gradual onset and persistence of constant left sided low back "pain" for the past 2 days.  Pain started after he was lifting a heavy part of his lawnmower. Pain worsens with palpation of the area and body position changes. Denies incont/retention of bowel or bladder, no saddle anesthesia, no focal motor weakness, no tingling/numbness in extremities, no fevers, no direct injury, no abd pain. Pt has not taken any medication to treat his pain.    Past Medical History  Diagnosis Date  . Bradycardia     Evaluated by Dr. Cletus Gash in 2009  . Hypertension   . Carcinoma of prostate ([place]) 2004  . Gout     Last symptomatic episode in 2006  . Hyperlipidemia     Lipid profile in 01/2012:203, 475, 37, X  . Tobacco abuse     0.5 pack per day; 30 pack years  . Auditory impairment     OS  . Systolic murmur     Consistent with mitral regurgitation  . Anemia, normocytic normochromic     H&H of 12.3/35.5 in 01/2012   Past Surgical History  Procedure Laterality Date  . Prostate surgery    . Bone spur    . Colonoscopy  10/02/2012    Procedure: COLONOSCOPY;  Surgeon: Danie Binder, MD;  [facility];  Service: Endoscopy;  Laterality: N/A;  10:30 AM   Family History  Problem Relation Age of Onset  . Coronary artery disease Neg Hx   . Colon cancer Neg Hx   . Heart disease Mother   . Arrhythmia Mother     brother(brady)   Social History  Substance Use Topics  . Smoking status: Current Every Day Smoker -- 0.50 packs/day for 55 years    Types: Cigarettes    Start date: 12/29/1957  . Smokeless tobacco: Never Used  . Alcohol Use: 2.4 oz/week    4 Standard drinks or equivalent per week    Review of Systems ROS: Statement: All systems negative except as marked or noted in  the HPI; Constitutional: Negative for fever and chills. ; ; Eyes: Negative for eye pain, redness and discharge. ; ; ENMT: Negative for ear pain, hoarseness, nasal congestion, sinus pressure and sore throat. ; ; Cardiovascular: Negative for chest pain, palpitations, diaphoresis, dyspnea and peripheral edema. ; ; Respiratory: Negative for cough, wheezing and stridor. ; ; Gastrointestinal: Negative for nausea, vomiting, diarrhea, abdominal pain, blood in stool, hematemesis, jaundice and rectal bleeding. . ; ; Genitourinary: Negative for dysuria, flank pain and hematuria. ; ; Musculoskeletal: +LBP. Negative for neck pain. Negative for swelling and trauma.; ; Skin: Negative for pruritus, rash, abrasions, blisters, bruising and skin lesion.; ; Neuro: Negative for headache, lightheadedness and neck stiffness. Negative for weakness, altered level of consciousness, altered mental status, extremity weakness, paresthesias, involuntary movement, seizure and syncope.      Allergies  Review of patient's allergies indicates no known allergies.  Home Medications   Prior to Admission medications   Medication Sig Start Date End Date Taking? Authorizing Provider  allopurinol (ZYLOPRIM) 300 MG tablet Take 300 mg by mouth daily.    Historical Provider, MD  aspirin EC 81 MG tablet Take 81 mg by mouth daily.  Historical Provider, MD  lisinopril (PRINIVIL,ZESTRIL) 20 MG tablet Take 20 mg by mouth daily.    Historical Provider, MD  simvastatin (ZOCOR) 40 MG tablet Take 40 mg by mouth daily.    Historical Provider, MD  sodium bicarbonate 650 MG tablet Take 650 mg by mouth 3 (three) times daily.    Historical Provider, MD   BP 156/54 mmHg  Pulse 38  Temp(Src) 97.6 F (36.4 C) (Oral)  Resp 16  Ht 5\' 9"  (1.753 m)  Wt 163 lb (73.936 kg)  BMI 24.06 kg/m2  SpO2 100% Physical Exam  1600: Physical examination:  Nursing notes reviewed; Vital signs and O2 SAT reviewed;  Constitutional: Well developed, Well nourished, Well  hydrated, In no acute distress; Head:  Normocephalic, atraumatic; Eyes: EOMI, PERRL, No scleral icterus; ENMT: Mouth and pharynx normal, Mucous membranes moist; Neck: Supple, Full range of motion, No lymphadenopathy; Cardiovascular: Bradycardic rate and rhythm, No gallop; Respiratory: Breath sounds clear & equal bilaterally, No wheezes.  Speaking full sentences with ease, Normal respiratory effort/excursion; Chest: Nontender, Movement normal; Abdomen: Soft, Nontender, Nondistended, Normal bowel sounds; Genitourinary: No CVA tenderness; Spine:  No midline CS, TS, LS tenderness. +TTP left lumbar paraspinal muscles.;; Extremities: Pulses normal, No tenderness, No edema, No calf edema or asymmetry.; Neuro: AA&Ox3, Major CN grossly intact.  Speech clear. No gross focal motor or sensory deficits in extremities. Climbs on and off stretcher easily by himself. Gait steady. Strength 5/5 equal bilat UE's and LE's, including great toe dorsiflexion.  DTR 2/4 equal bilat UE's and LE's.  No gross sensory deficits.  Neg straight leg raises bilat.; Skin: Color normal, Warm, Dry.    ED Course  Procedures (including critical care time) Labs Review  Imaging Review  I have personally reviewed and evaluated these images and lab results as part of my medical decision-making.   EKG Interpretation None      MDM  MDM Reviewed: previous chart, nursing note and vitals Interpretation: CT scan      Ct Lumbar Spine Wo Contrast 02/17/2016  CLINICAL DATA:  Low back pain since lifting a heavy lawnmower 2 days ago. Increased pain when ambulating. EXAM: CT LUMBAR SPINE WITHOUT CONTRAST TECHNIQUE: Multidetector CT imaging of the lumbar spine was performed without intravenous contrast administration. Multiplanar CT image reconstructions were also generated. COMPARISON:  None. FINDINGS: There are 4 non-rib-bearing lumbar vertebrae and a transitional lumbosacral vertebra, fused with the remainder of the sacrum on the left. The  transitional lumbosacral vertebra is labeled L5 for counting purposes. No fractures, pars defects or subluxations are seen. T12-L1:  Unremarkable. L1-2: Mild diffuse disc bulging and bilateral ligamentum flavum hypertrophy, producing moderate canal stenosis. There is also moderate bilateral foraminal stenosis, greater on the left, without evidence of nerve root compression. L2-3: Mild diffuse disc bulging. Bilateral facet and ligamentum flavum hypertrophy. These changes are producing mild to moderate canal stenosis and mild to moderate bilateral foraminal stenosis without nerve root compression. L3-4: Mild to moderate diffuse disc bulging, marked bilateral ligamentum flavum hypertrophy and moderate bilateral facet hypertrophy. These changes are producing moderate-to-marked canal stenosis and mild to moderate bilateral foraminal stenosis without evidence of nerve root compression. L4-5: Marked disc space narrowing and mild endplate irregularity, vacuum phenomenon, mild diffuse posterior disc bulging and associated spur formation, small moderate-sized broad-based posterior disc herniation with calcification and spur formation on the left and mild to moderate facet hypertrophy on the right. These changes are producing mild to moderate canal stenosis, moderate to marked foraminal stenosis on the right  and moderate foraminal stenosis on the left with no definite nerve root compression seen. L5-S1: Moderate facet hypertrophy on the right. Otherwise, unremarkable. A small left iliac bone island is noted. IMPRESSION: IMPRESSION Multilevel degenerative changes, as described above. No acute abnormality and no evidence of nerve root compression. Electronically Signed   By: Claudie Revering M.D.   On: 02/17/2016 18:07    1845:  CT scan with DDD. Feels better after tylenol and wants to go home now. VS per baseline. Tx symptomatically, f/u PMD. Dx and testing d/w pt and family.  Questions answered.  Verb understanding, agreeable  to d/c home with outpt f/u.   Francine Graven, DO 02/21/16 2039

## 2016-02-17 NOTE — ED Notes (Signed)
Complains of lower back pain that started yesterday while riding lawn mower. Pain is worse with ambulation.

## 2016-05-09 ENCOUNTER — Ambulatory Visit (INDEPENDENT_AMBULATORY_CARE_PROVIDER_SITE_OTHER): Payer: Medicare Other | Admitting: Otolaryngology

## 2016-05-09 DIAGNOSIS — H903 Sensorineural hearing loss, bilateral: Secondary | ICD-10-CM

## 2016-11-13 ENCOUNTER — Encounter: Payer: Self-pay | Admitting: *Deleted

## 2016-11-13 ENCOUNTER — Encounter: Payer: Self-pay | Admitting: Cardiology

## 2016-11-13 ENCOUNTER — Ambulatory Visit (INDEPENDENT_AMBULATORY_CARE_PROVIDER_SITE_OTHER): Payer: Medicare Other | Admitting: Cardiology

## 2016-11-13 VITALS — BP 130/76 | HR 54 | Ht 69.0 in | Wt 156.0 lb

## 2016-11-13 DIAGNOSIS — E782 Mixed hyperlipidemia: Secondary | ICD-10-CM | POA: Diagnosis not present

## 2016-11-13 DIAGNOSIS — I10 Essential (primary) hypertension: Secondary | ICD-10-CM | POA: Diagnosis not present

## 2016-11-13 DIAGNOSIS — R001 Bradycardia, unspecified: Secondary | ICD-10-CM

## 2016-11-13 NOTE — Patient Instructions (Signed)
Your physician wants you to follow-up in: 1 Year with Dr. Branch. You will receive a reminder letter in the mail two months in advance. If you don't receive a letter, please call our office to schedule the follow-up appointment.   Your physician recommends that you continue on your current medications as directed. Please refer to the Current Medication list given to you today.  If you need a refill on your cardiac medications before your next appointment, please call your pharmacy.  Thank you for choosing [facility]!   

## 2016-11-13 NOTE — Progress Notes (Signed)
Clinical Summary Nathan [name] is a 81 y.o.male seen today for follow up of the following medical problems.   1. Sinus bradycardia - long history of sinus brady that has been asymptomatic per prior notes. Thyroid studies have been normal, has not been on AV nodal agents  - no recent lightheadedness or dizziness   2. HTN - does not check blood pressure at home regularly - compliant with meds  3. Hyperlipidemia - compliant with statin    SH: wife passed November 2017 due to cancer.  Past Medical History:  Diagnosis Date  . Anemia, normocytic normochromic    H&H of 12.3/35.5 in 01/2012  . Auditory impairment    OS  . Bradycardia    Evaluated by Dr. Cletus Gash in 2009  . Carcinoma of prostate ([place]) 2004  . Gout    Last symptomatic episode in 2006  . Hyperlipidemia    Lipid profile in 01/2012:203, 475, 37, X  . Hypertension   . Systolic murmur    Consistent with mitral regurgitation  . Tobacco abuse    0.5 pack per day; 30 pack years     No Known Allergies   Current Outpatient Prescriptions  Medication Sig Dispense Refill  . allopurinol (ZYLOPRIM) 300 MG tablet Take 300 mg by mouth daily.    Marland Kitchen aspirin EC 81 MG tablet Take 81 mg by mouth daily.    Marland Kitchen lisinopril (PRINIVIL,ZESTRIL) 20 MG tablet Take 20 mg by mouth daily.    . simvastatin (ZOCOR) 40 MG tablet Take 40 mg by mouth daily.    . sodium bicarbonate 650 MG tablet Take 650 mg by mouth 3 (three) times daily.     No current facility-administered medications for this visit.      Past Surgical History:  Procedure Laterality Date  . bone spur    . COLONOSCOPY  10/02/2012   Procedure: COLONOSCOPY;  Surgeon: Danie Binder, MD;  [facility];  Service: Endoscopy;  Laterality: N/A;  10:30 AM  . PROSTATE SURGERY       No Known Allergies    Family History  Problem Relation Age of Onset  . Coronary artery disease Neg Hx   . Colon cancer Neg Hx   . Heart disease Mother   . Arrhythmia Mother       brother(brady)     Social History Nathan [name] reports that he has been smoking Cigarettes.  He started smoking about 58 years ago. He has a 27.50 pack-year smoking history. He has never used smokeless tobacco. Nathan [name] reports that he drinks about 2.4 oz of alcohol per week .   Review of Systems CONSTITUTIONAL: No weight loss, fever, chills, weakness or fatigue.  HEENT: Eyes: No visual loss, blurred vision, double vision or yellow sclerae.No hearing loss, sneezing, congestion, runny nose or sore throat.  SKIN: No rash or itching.  CARDIOVASCULAR: per HPI RESPIRATORY: No shortness of breath, cough or sputum.  GASTROINTESTINAL: No anorexia, nausea, vomiting or diarrhea. No abdominal pain or blood.  GENITOURINARY: No burning on urination, no polyuria NEUROLOGICAL: No headache, dizziness, syncope, paralysis, ataxia, numbness or tingling in the extremities. No change in bowel or bladder control.  MUSCULOSKELETAL: No muscle, back pain, joint pain or stiffness.  LYMPHATICS: No enlarged nodes. No history of splenectomy.  PSYCHIATRIC: No history of depression or anxiety.  ENDOCRINOLOGIC: No reports of sweating, cold or heat intolerance. No polyuria or polydipsia.  Marland Kitchen   Physical Examination Vitals:   11/13/16 0903  BP: 130/76  Pulse: (!) 54   Vitals:   11/13/16 0903  Weight: 156 lb (70.8 kg)  Height: 5\' 9"  (1.753 m)    Gen: resting comfortably, no acute distress HEENT: no scleral icterus, pupils equal round and reactive, no palptable cervical adenopathy,  CV Resp: Clear to auscultation bilaterally GI: abdomen is soft, non-tender, non-distended, normal bowel sounds, no hepatosplenomegaly MSK: extremities are warm, no edema.  Skin: warm, no rash Neuro:  no focal deficits Psych: appropriate affect     Assessment and Plan  1. Sinus bradycardia -long history of sinus bradycardia, he has been asymptomatic.y. - we will continue to monitor   2. HTN -he is  at goal,  continue current meds  3. Hyperlipidemia -we will request labs from pcp - continue statin  F/u 1 year      Arnoldo Lenis, M.D., F.A.C.C.

## 2017-05-15 ENCOUNTER — Ambulatory Visit (INDEPENDENT_AMBULATORY_CARE_PROVIDER_SITE_OTHER): Payer: Medicare Other | Admitting: Otolaryngology

## 2017-05-15 DIAGNOSIS — H903 Sensorineural hearing loss, bilateral: Secondary | ICD-10-CM

## 2017-05-21 NOTE — Patient Instructions (Signed)
Your procedure is scheduled on: 05/30/2017  Report to [facility] at  1010   AM.  Call this number if you have problems the morning of surgery: [phone]   Do not eat food or drink liquids :After Midnight.      Take these medicines the morning of surgery with A SIP OF WATER: allopurinol, lisinopril.   Do not wear jewelry, make-up or nail polish.  Do not wear lotions, powders, or perfumes. You may wear deodorant.  Do not shave 48 hours prior to surgery.  Do not bring valuables to the hospital.  Contacts, dentures or bridgework may not be worn into surgery.  Leave suitcase in the car. After surgery it may be brought to your room.  For patients admitted to the hospital, checkout time is 11:00 AM the day of discharge.   Patients discharged the day of surgery will not be allowed to drive home.  :     Please read over the following fact sheets that you were given: Coughing and Deep Breathing, Surgical Site Infection Prevention, Anesthesia Post-op Instructions and Care and Recovery After Surgery    Cataract A cataract is a clouding of the lens of the eye. When a lens becomes cloudy, vision is reduced based on the degree and nature of the clouding. Many cataracts reduce vision to some degree. Some cataracts make people more near-sighted as they develop. Other cataracts increase glare. Cataracts that are ignored and become worse can sometimes look white. The white color can be seen through the pupil. CAUSES   Aging. However, cataracts may occur at any age, even in newborns.   Certain drugs.   Trauma to the eye.   Certain diseases such as diabetes.   Specific eye diseases such as chronic inflammation inside the eye or a sudden attack of a rare form of glaucoma.   Inherited or acquired medical problems.  SYMPTOMS   Gradual, progressive drop in vision in the affected eye.   Severe, rapid visual loss. This most often happens when trauma is the cause.  DIAGNOSIS  To detect a cataract, an  eye doctor examines the lens. Cataracts are best diagnosed with an exam of the eyes with the pupils enlarged (dilated) by drops.  TREATMENT  For an early cataract, vision may improve by using different eyeglasses or stronger lighting. If that does not help your vision, surgery is the only effective treatment. A cataract needs to be surgically removed when vision loss interferes with your everyday activities, such as driving, reading, or watching TV. A cataract may also have to be removed if it prevents examination or treatment of another eye problem. Surgery removes the cloudy lens and usually replaces it with a substitute lens (intraocular lens, IOL).  At a time when both you and your doctor agree, the cataract will be surgically removed. If you have cataracts in both eyes, only one is usually removed at a time. This allows the operated eye to heal and be out of danger from any possible problems after surgery (such as infection or poor wound healing). In rare cases, a cataract may be doing damage to your eye. In these cases, your caregiver may advise surgical removal right away. The vast majority of people who have cataract surgery have better vision afterward. HOME CARE INSTRUCTIONS  If you are not planning surgery, you may be asked to do the following:  Use different eyeglasses.   Use stronger or brighter lighting.   Ask your eye doctor about reducing your medicine  dose or changing medicines if it is thought that a medicine caused your cataract. Changing medicines does not make the cataract go away on its own.   Become familiar with your surroundings. Poor vision can lead to injury. Avoid bumping into things on the affected side. You are at a higher risk for tripping or falling.   Exercise extreme care when driving or operating machinery.   Wear sunglasses if you are sensitive to bright light or experiencing problems with glare.  SEEK IMMEDIATE MEDICAL CARE IF:   You have a worsening or sudden  vision loss.   You notice redness, swelling, or increasing pain in the eye.   You have a fever.  Document Released: 09/09/2005 Document Revised: 08/29/2011 Document Reviewed: 05/03/2011 [facility] Patient Information 2012 [place].PATIENT INSTRUCTIONS POST-ANESTHESIA  IMMEDIATELY FOLLOWING SURGERY:  Do not drive or operate machinery for the first twenty four hours after surgery.  Do not make any important decisions for twenty four hours after surgery or while taking narcotic pain medications or sedatives.  If you develop intractable nausea and vomiting or a severe headache please notify your doctor immediately.  FOLLOW-UP:  Please make an appointment with your surgeon as instructed. You do not need to follow up with anesthesia unless specifically instructed to do so.  WOUND CARE INSTRUCTIONS (if applicable):  Keep a dry clean dressing on the anesthesia/puncture wound site if there is drainage.  Once the wound has quit draining you may leave it open to air.  Generally you should leave the bandage intact for twenty four hours unless there is drainage.  If the epidural site drains for more than 36-48 hours please call the anesthesia department.  QUESTIONS?:  Please feel free to call your physician or the hospital operator if you have any questions, and they will be happy to assist you.

## 2017-05-23 MED ORDER — TETRACAINE HCL 0.5 % OP SOLN: 1.0000 [drp] | OPHTHALMIC | Status: DC

## 2017-05-23 MED ORDER — PHENYLEPHRINE HCL 2.5 % OP SOLN: 1.0000 [drp] | OPHTHALMIC | Status: DC

## 2017-05-23 MED ORDER — LIDOCAINE HCL 3.5 % OP GEL: 1.0000 "application " | Freq: Once | OPHTHALMIC | Status: DC

## 2017-05-23 MED ORDER — CYCLOPENTOLATE-PHENYLEPHRINE 0.2-1 % OP SOLN: 1.0000 [drp] | OPHTHALMIC | Status: DC

## 2017-05-27 ENCOUNTER — Encounter (HOSPITAL_COMMUNITY)
Admission: RE | Admit: 2017-05-27 | Discharge: 2017-05-27 | Disposition: A | Discharge status: Home / Self Care | Payer: Medicare Other | Source: Ambulatory Visit | Attending: Ophthalmology | Admitting: Ophthalmology

## 2017-05-27 ENCOUNTER — Encounter (HOSPITAL_COMMUNITY): Payer: Self-pay

## 2017-05-27 DIAGNOSIS — H259 Unspecified age-related cataract: Secondary | ICD-10-CM | POA: Diagnosis present

## 2017-05-27 DIAGNOSIS — Z538 Procedure and treatment not carried out for other reasons: Secondary | ICD-10-CM | POA: Diagnosis not present

## 2017-05-27 LAB — BASIC METABOLIC PANEL WITH GFR
Anion gap: 8 (ref 5–15)
BUN: 26 mg/dL — ABNORMAL HIGH (ref 6–20)
CO2: 24 mmol/L (ref 22–32)
Calcium: 9.6 mg/dL (ref 8.9–10.3)
Chloride: 105 mmol/L (ref 101–111)
Creatinine, Ser: 1.72 mg/dL — ABNORMAL HIGH (ref 0.61–1.24)
GFR calc Af Amer: 42 mL/min — ABNORMAL LOW
GFR calc non Af Amer: 36 mL/min — ABNORMAL LOW
Glucose, Bld: 94 mg/dL (ref 65–99)
Potassium: 4.3 mmol/L (ref 3.5–5.1)
Sodium: 137 mmol/L (ref 135–145)

## 2017-05-27 LAB — CBC
HCT: 33.6 % — ABNORMAL LOW (ref 39.0–52.0)
Hemoglobin: 11.6 g/dL — ABNORMAL LOW (ref 13.0–17.0)
MCH: 29 pg (ref 26.0–34.0)
MCHC: 34.5 g/dL (ref 30.0–36.0)
MCV: 84 fL (ref 78.0–100.0)
PLATELETS: 146 10*3/uL — AB (ref 150–400)
RBC: 4 MIL/uL — AB (ref 4.22–5.81)
RDW: 14.4 % (ref 11.5–15.5)
WBC: 5.2 10*3/uL (ref 4.0–10.5)

## 2017-05-27 NOTE — Pre-Procedure Instructions (Signed)
EKG and heart history discussed with Dr Patsey Berthold. No orders given.

## 2017-05-30 ENCOUNTER — Ambulatory Visit (HOSPITAL_COMMUNITY)
Admission: RE | Admit: 2017-05-30 | Discharge: 2017-05-30 | Disposition: A | Discharge status: Home / Self Care | Payer: Medicare Other | Source: Ambulatory Visit | Attending: Ophthalmology | Admitting: Ophthalmology

## 2017-05-30 ENCOUNTER — Encounter (HOSPITAL_COMMUNITY)
Admission: RE | Disposition: A | Discharge status: Home / Self Care | Payer: Self-pay | Source: Ambulatory Visit | Attending: Ophthalmology

## 2017-05-30 ENCOUNTER — Ambulatory Visit (HOSPITAL_COMMUNITY): Payer: Medicare Other | Admitting: Anesthesiology

## 2017-05-30 ENCOUNTER — Encounter (HOSPITAL_COMMUNITY): Payer: Self-pay | Admitting: *Deleted

## 2017-05-30 ENCOUNTER — Telehealth: Payer: Self-pay | Admitting: Cardiology

## 2017-05-30 DIAGNOSIS — H259 Unspecified age-related cataract: Secondary | ICD-10-CM | POA: Insufficient documentation

## 2017-05-30 DIAGNOSIS — Z538 Procedure and treatment not carried out for other reasons: Secondary | ICD-10-CM | POA: Diagnosis not present

## 2017-05-30 SURGERY — PHACOEMULSIFICATION, CATARACT, WITH IOL INSERTION
Anesthesia: Monitor Anesthesia Care | Laterality: Left

## 2017-05-30 MED ORDER — ATROPINE SULFATE 1 MG/ML IJ SOLN
INTRAMUSCULAR | Status: AC
  Filled 2017-05-30: qty 1

## 2017-05-30 MED ORDER — CYCLOPENTOLATE-PHENYLEPHRINE 0.2-1 % OP SOLN
1.0000 [drp] | OPHTHALMIC | Status: AC
  Administered 2017-05-30 (×3): 1 [drp] via OPHTHALMIC

## 2017-05-30 MED ORDER — MIDAZOLAM HCL 2 MG/2ML IJ SOLN
1.0000 mg | INTRAMUSCULAR | Status: AC
  Administered 2017-05-30: 2 mg via INTRAVENOUS
  Filled 2017-05-30: qty 2

## 2017-05-30 MED ORDER — LIDOCAINE HCL 3.5 % OP GEL: 1.0000 "application " | Freq: Once | OPHTHALMIC | Status: DC

## 2017-05-30 MED ORDER — ATROPINE SULFATE 1 MG/ML IJ SOLN
0.4000 mg | Freq: Once | INTRAMUSCULAR | Status: AC
  Administered 2017-05-30: 0.4 mg via INTRAVENOUS

## 2017-05-30 MED ORDER — LACTATED RINGERS IV SOLN
INTRAVENOUS | Status: DC
  Administered 2017-05-30: 10:00:00 via INTRAVENOUS

## 2017-05-30 MED ORDER — TETRACAINE HCL 0.5 % OP SOLN
1.0000 [drp] | OPHTHALMIC | Status: AC
  Administered 2017-05-30 (×3): 1 [drp] via OPHTHALMIC

## 2017-05-30 MED ORDER — PHENYLEPHRINE HCL 2.5 % OP SOLN
1.0000 [drp] | OPHTHALMIC | Status: AC
  Administered 2017-05-30 (×3): 1 [drp] via OPHTHALMIC

## 2017-05-30 MED ORDER — FENTANYL CITRATE (PF) 100 MCG/2ML IJ SOLN
25.0000 ug | Freq: Once | INTRAMUSCULAR | Status: AC
  Administered 2017-05-30: 25 ug via INTRAVENOUS
  Filled 2017-05-30: qty 2

## 2017-05-30 MED ORDER — ATROPINE SULFATE 1 MG/ML IJ SOLN
0.4000 mg | Freq: Once | INTRAMUSCULAR | Status: DC
  Filled 2017-05-30: qty 1

## 2017-05-30 NOTE — Progress Notes (Signed)
Patient arrived to short stay preop for cataract surgery. Heart rate 38 on telemetry monitor. Dr. Patsey Berthold notified. 0.4 atropine given at 1010 per physician verbal order. Heart rate up to 45. 2nd dose atropine 0.4 mg given at 1020 per Dr. Patsey Berthold verbal order. Heart rate to 48. While Dr. Patsey Berthold and Dr. Marisa Hua discussing patient, heart rate elevated to 75-85. Decided to proceed with surgery and ok was given to give versed as ordered. Versed 2 mg given via IV. Heart proceeded to drop to 48. Family at bedside. Decision made to cancel surgery by Dr. Patsey Berthold and Dr. Marisa Hua. Cardiology consult ordered and Dr. Harl Bowie called. 12 lead EKG done. Patient seen by Dr. Harl Bowie and appointment made for patient to follow up with Dr. Lovena Le. Family and patient given strict instructions to go to emergency room if patient had any episodes of being light headed, faint, dizzy or chest pain.  Moderate sedation information given to family and patient. IV removed. Patient discharge home with family as per physician verbal order.

## 2017-05-30 NOTE — Telephone Encounter (Signed)
Patient is in Short Stay at [facility] and cataract surgery Held because HR went into 30's after given anesthesia per son.   Son wants to know if you will go by Short Stay and see his father

## 2017-05-30 NOTE — H&P (Signed)
The H and P was reviewed and updated. The patient was examined.  No changes were found after exam.  The surgical eye was marked.  

## 2017-05-30 NOTE — Telephone Encounter (Signed)
Per pt's family--pt is here having eye surgery but they are not able to do it due to HR dropping to the 30's. Please give his son Nathan [name] a call @ [phone]

## 2017-05-30 NOTE — Anesthesia Preprocedure Evaluation (Signed)
Anesthesia Evaluation  Patient identified by MRN, date of birth, ID band Patient awake    Reviewed: Allergy & Precautions, NPO status , Patient's Chart, lab work & pertinent test results  Airway Mallampati: II  TM Distance: >3 FB     Dental  (+) Edentulous Upper, Teeth Intact   Pulmonary Current Smoker,    breath sounds clear to auscultation       Cardiovascular hypertension, Pt. on medications + dysrhythmias + Valvular Problems/Murmurs MR  Rhythm:Regular Rate:Bradycardia     Neuro/Psych    GI/Hepatic negative GI ROS, Neg liver ROS,   Endo/Other  negative endocrine ROS  Renal/GU      Musculoskeletal   Abdominal   Peds  Hematology  (+) anemia ,   Anesthesia Other Findings   Reproductive/Obstetrics                             Anesthesia Physical Anesthesia Plan  ASA: III  Anesthesia Plan: MAC   Post-op Pain Management:    Induction: Intravenous  PONV Risk Score and Plan:   Airway Management Planned: Nasal Cannula  Additional Equipment:   Intra-op Plan:   Post-operative Plan:   Informed Consent: I have reviewed the patients History and Physical, chart, labs and discussed the procedure including the risks, benefits and alternatives for the proposed anesthesia with the patient or authorized representative who has indicated his/her understanding and acceptance.     Plan Discussed with:   Anesthesia Plan Comments:         Anesthesia Quick Evaluation

## 2017-05-30 NOTE — Discharge Instructions (Signed)
Dr. Nelly Laurence office to call with appointment with Dr. Lovena Le.   Moderate Conscious Sedation, Adult, Care After These instructions provide you with information about caring for yourself after your procedure. Your health care provider may also give you more specific instructions. Your treatment has been planned according to current medical practices, but problems sometimes occur. Call your health care provider if you have any problems or questions after your procedure. What can I expect after the procedure? After your procedure, it is common:  To feel sleepy for several hours.  To feel clumsy and have poor balance for several hours.  To have poor judgment for several hours.  To vomit if you eat too soon.  Follow these instructions at home: For at least 24 hours after the procedure:   Do not: ? Participate in activities where you could fall or become injured. ? Drive. ? Use heavy machinery. ? Drink alcohol. ? Take sleeping pills or medicines that cause drowsiness. ? Make important decisions or sign legal documents. ? Take care of children on your own.  Rest. Eating and drinking  Follow the diet recommended by your health care provider.  If you vomit: ? Drink water, juice, or soup when you can drink without vomiting. ? Make sure you have little or no nausea before eating solid foods. General instructions  Have a responsible adult stay with you until you are awake and alert.  Take over-the-counter and prescription medicines only as told by your health care provider.  If you smoke, do not smoke without supervision.  Keep all follow-up visits as told by your health care provider. This is important. Contact a health care provider if:  You keep feeling nauseous or you keep vomiting.  You feel light-headed.  You develop a rash.  You have a fever. Get help right away if:  You have trouble breathing. This information is not intended to replace advice given to you by your  health care provider. Make sure you discuss any questions you have with your health care provider. Document Released: 06/30/2013 Document Revised: 02/12/2016 Document Reviewed: 12/30/2015 Elsevier Interactive Patient Education  [employer].

## 2017-05-30 NOTE — Telephone Encounter (Signed)
Dr Harl Bowie saw pt already, pt has apt scheduled with Dr Lovena Le per son

## 2017-06-12 ENCOUNTER — Ambulatory Visit (INDEPENDENT_AMBULATORY_CARE_PROVIDER_SITE_OTHER): Payer: Medicare Other | Admitting: Internal Medicine

## 2017-06-12 ENCOUNTER — Encounter: Payer: Self-pay | Admitting: Internal Medicine

## 2017-06-12 VITALS — BP 126/70 | HR 48 | Ht 69.0 in | Wt 156.0 lb

## 2017-06-12 DIAGNOSIS — R001 Bradycardia, unspecified: Secondary | ICD-10-CM | POA: Diagnosis not present

## 2017-06-12 NOTE — Patient Instructions (Addendum)
Medication Instructions:  Your physician recommends that you continue on your current medications as directed. Please refer to the Current Medication list given to you today.  Labwork: None ordered.  Testing/Procedures: None ordered.  Follow-Up: Your physician wants you to follow-up as needed with Dr Taylor     Any Other Special Instructions Will Be Listed Below (If Applicable).  If you need a refill on your cardiac medications before your next appointment, please call your pharmacy.   

## 2017-06-12 NOTE — Progress Notes (Signed)
HPI [name] is referred today for ongoing evaluation and management of sinus bradycardia by Dr. Harl Bowie. He is a very pleasant 81 year old man with a history of hypertension, chronic renal insufficiency, and long-standing sinus node dysfunction. He was pending cataract removal when he was found in preoperative evaluation to have sinus bradycardia. His surgery was canceled and he was referred for additional evaluation. He is asymptomatic. He does admit to being fairly sedentary. He has not had chest pain, shortness of breath, syncope, or dizzy spells. His resting heart rates are in the low 40s. He denies vision changes or other symptoms consistent with sinus bradycardia. His only activity is that he will most the grass with a riding mower. No edema. No Known Allergies   Current Outpatient Prescriptions  Medication Sig Dispense Refill  . allopurinol (ZYLOPRIM) 300 MG tablet Take 300 mg by mouth daily.    Marland Kitchen aspirin EC 81 MG tablet Take 81 mg by mouth daily.    . cholecalciferol (VITAMIN D) 1000 units tablet Take 1,000 Units by mouth 3 (three) times a week.     Marland Kitchen lisinopril (PRINIVIL,ZESTRIL) 20 MG tablet Take 20 mg by mouth daily.    . simvastatin (ZOCOR) 40 MG tablet Take 40 mg by mouth daily.    . sodium bicarbonate 650 MG tablet Take 650-1,300 mg by mouth 2 (two) times daily. Takes 2 tabs in the morning and 1 in the afternoon     No current facility-administered medications for this visit.      Past Medical History:  Diagnosis Date  . Anemia, normocytic normochromic    H&H of 12.3/35.5 in 01/2012  . Auditory impairment    OS  . Bradycardia    Evaluated by Dr. Cletus Gash in 2009  . Carcinoma of prostate ([place]) 2004  . Gout    Last symptomatic episode in 2006  . Hyperlipidemia    Lipid profile in 01/2012:203, 475, 37, X  . Hypertension   . Systolic murmur    Consistent with mitral regurgitation  . Tobacco abuse    0.5 pack per day; 30 pack years    ROS:   All systems  reviewed and negative except as noted in the HPI.   Past Surgical History:  Procedure Laterality Date  . bone spur Right    elbow  . COLONOSCOPY  10/02/2012   Procedure: COLONOSCOPY;  Surgeon: Danie Binder, MD;  Location: [facility];  Service: Endoscopy;  Laterality: N/A;  10:30 AM  . PROSTATE SURGERY       Family History  Problem Relation Age of Onset  . Heart disease Mother   . Arrhythmia Mother        brother(brady)  . Coronary artery disease Neg Hx   . Colon cancer Neg Hx      Social History   Social History  . Marital status: Widowed    Spouse name: N/A  . Number of children: N/A  . Years of education: N/A   Occupational History  . Retired [employer]  . Smoking status: Current Every Day Smoker    Packs/day: 0.50    Years: 55.00    Types: Cigarettes    Start date: 12/29/1957  . Smokeless tobacco: Never Used  . Alcohol use 2.4 oz/week    4 Standard drinks or equivalent per week  . Drug use: No  . Sexual activity: Not Currently    Birth control/ protection: None   Other Topics Concern  .  Not on file   Social History Narrative  . No narrative on file     BP 126/70   Pulse (!) 48   Ht 5\' 9"  (1.753 m)   Wt 156 lb (70.8 kg)   BMI 23.04 kg/m   Physical Exam:  Well appearing Elderly man, NAD HEENT: Unremarkable Neck:  6 cm JVD, no thyromegally Lymphatics:  No adenopathy Back:  No CVA tenderness Lungs:  Clear, With no wheezes, rales, or rhonchi. HEART:  Regular bradycardic rhythm, no murmurs, no rubs, no clicks Abd:  soft, positive bowel sounds, no organomegally, no rebound, no guarding Ext:  2 plus pulses, no edema, no cyanosis, no clubbing Skin:  No rashes no nodules Neuro:  CN II through XII intact, motor grossly intact  EKG - none today, previously sinus bradycardia at 40 beats a minute  Assess/Plan: 1. Sinus node dysfunction - the patient's resting heart rate is in the low 40s. Today we watched the  patient at a moderate pace around the office in his heart rate got up into the mid 50s. There is no reduction in oxygen saturation, and he was asymptomatic. He has no symptoms of sinus node dysfunction at this point. I've recommended that he undergo cataract surgery with no limitation. There may, day when he ultimately will need pacemaker for backup sinus node dysfunction, but currently this is not recommended. 2. Preoperative evaluation - he is low risk for cardiovascular complications from cataract surgery. I recommend he be allowed to proceed. Avoidance of medications that would slow his sinus node or AV node would be recommended. 3. Hypertension - his blood pressure is reasonably well controlled today. No change in medications. 4. Dyslipidemia - he will continue simvastatin. No other changes at this time.  Cristopher Peru, M.D.

## 2017-07-07 ENCOUNTER — Encounter (HOSPITAL_COMMUNITY): Payer: Self-pay

## 2017-07-07 ENCOUNTER — Other Ambulatory Visit: Payer: Self-pay

## 2017-07-07 ENCOUNTER — Encounter (HOSPITAL_COMMUNITY)
Admission: RE | Admit: 2017-07-07 | Discharge: 2017-07-07 | Disposition: A | Discharge status: Home / Self Care | Payer: Medicare Other | Source: Ambulatory Visit | Attending: Ophthalmology | Admitting: Ophthalmology

## 2017-07-07 DIAGNOSIS — Z01818 Encounter for other preprocedural examination: Secondary | ICD-10-CM | POA: Insufficient documentation

## 2017-07-07 DIAGNOSIS — E079 Disorder of thyroid, unspecified: Secondary | ICD-10-CM | POA: Insufficient documentation

## 2017-07-07 DIAGNOSIS — Z79899 Other long term (current) drug therapy: Secondary | ICD-10-CM | POA: Insufficient documentation

## 2017-07-07 DIAGNOSIS — I10 Essential (primary) hypertension: Secondary | ICD-10-CM | POA: Insufficient documentation

## 2017-07-07 DIAGNOSIS — Z7982 Long term (current) use of aspirin: Secondary | ICD-10-CM | POA: Insufficient documentation

## 2017-07-07 DIAGNOSIS — H269 Unspecified cataract: Secondary | ICD-10-CM | POA: Insufficient documentation

## 2017-07-07 NOTE — Patient Instructions (Signed)
Your procedure is scheduled on: 07/11/2017   Report to [facility] at  700  AM.  Call this number if you have problems the morning of surgery: [phone]   Do not eat food or drink liquids :After Midnight.      Take these medicines the morning of surgery with A SIP OF WATER: allopurinol, lisinopril.   Do not wear jewelry, make-up or nail polish.  Do not wear lotions, powders, or perfumes. You may wear deodorant.  Do not shave 48 hours prior to surgery.  Do not bring valuables to the hospital.  Contacts, dentures or bridgework may not be worn into surgery.  Leave suitcase in the car. After surgery it may be brought to your room.  For patients admitted to the hospital, checkout time is 11:00 AM the day of discharge.   Patients discharged the day of surgery will not be allowed to drive home.  :     Please read over the following fact sheets that you were given: Coughing and Deep Breathing, Surgical Site Infection Prevention, Anesthesia Post-op Instructions and Care and Recovery After Surgery    Cataract A cataract is a clouding of the lens of the eye. When a lens becomes cloudy, vision is reduced based on the degree and nature of the clouding. Many cataracts reduce vision to some degree. Some cataracts make people more near-sighted as they develop. Other cataracts increase glare. Cataracts that are ignored and become worse can sometimes look white. The white color can be seen through the pupil. CAUSES   Aging. However, cataracts may occur at any age, even in newborns.   Certain drugs.   Trauma to the eye.   Certain diseases such as diabetes.   Specific eye diseases such as chronic inflammation inside the eye or a sudden attack of a rare form of glaucoma.   Inherited or acquired medical problems.  SYMPTOMS   Gradual, progressive drop in vision in the affected eye.   Severe, rapid visual loss. This most often happens when trauma is the cause.  DIAGNOSIS  To detect a cataract, an  eye doctor examines the lens. Cataracts are best diagnosed with an exam of the eyes with the pupils enlarged (dilated) by drops.  TREATMENT  For an early cataract, vision may improve by using different eyeglasses or stronger lighting. If that does not help your vision, surgery is the only effective treatment. A cataract needs to be surgically removed when vision loss interferes with your everyday activities, such as driving, reading, or watching TV. A cataract may also have to be removed if it prevents examination or treatment of another eye problem. Surgery removes the cloudy lens and usually replaces it with a substitute lens (intraocular lens, IOL).  At a time when both you and your doctor agree, the cataract will be surgically removed. If you have cataracts in both eyes, only one is usually removed at a time. This allows the operated eye to heal and be out of danger from any possible problems after surgery (such as infection or poor wound healing). In rare cases, a cataract may be doing damage to your eye. In these cases, your caregiver may advise surgical removal right away. The vast majority of people who have cataract surgery have better vision afterward. HOME CARE INSTRUCTIONS  If you are not planning surgery, you may be asked to do the following:  Use different eyeglasses.   Use stronger or brighter lighting.   Ask your eye doctor about reducing your medicine  dose or changing medicines if it is thought that a medicine caused your cataract. Changing medicines does not make the cataract go away on its own.   Become familiar with your surroundings. Poor vision can lead to injury. Avoid bumping into things on the affected side. You are at a higher risk for tripping or falling.   Exercise extreme care when driving or operating machinery.   Wear sunglasses if you are sensitive to bright light or experiencing problems with glare.  SEEK IMMEDIATE MEDICAL CARE IF:   You have a worsening or sudden  vision loss.   You notice redness, swelling, or increasing pain in the eye.   You have a fever.  Document Released: 09/09/2005 Document Revised: 08/29/2011 Document Reviewed: 05/03/2011 [facility] Patient Information 2012 [place].PATIENT INSTRUCTIONS POST-ANESTHESIA  IMMEDIATELY FOLLOWING SURGERY:  Do not drive or operate machinery for the first twenty four hours after surgery.  Do not make any important decisions for twenty four hours after surgery or while taking narcotic pain medications or sedatives.  If you develop intractable nausea and vomiting or a severe headache please notify your doctor immediately.  FOLLOW-UP:  Please make an appointment with your surgeon as instructed. You do not need to follow up with anesthesia unless specifically instructed to do so.  WOUND CARE INSTRUCTIONS (if applicable):  Keep a dry clean dressing on the anesthesia/puncture wound site if there is drainage.  Once the wound has quit draining you may leave it open to air.  Generally you should leave the bandage intact for twenty four hours unless there is drainage.  If the epidural site drains for more than 36-48 hours please call the anesthesia department.  QUESTIONS?:  Please feel free to call your physician or the hospital operator if you have any questions, and they will be happy to assist you.

## 2017-07-11 ENCOUNTER — Ambulatory Visit (HOSPITAL_COMMUNITY): Payer: Medicare Other | Admitting: Anesthesiology

## 2017-07-11 ENCOUNTER — Encounter (HOSPITAL_COMMUNITY)
Admission: RE | Disposition: A | Discharge status: Home / Self Care | Payer: Self-pay | Source: Ambulatory Visit | Attending: Ophthalmology

## 2017-07-11 ENCOUNTER — Ambulatory Visit (HOSPITAL_COMMUNITY)
Admission: RE | Admit: 2017-07-11 | Discharge: 2017-07-11 | Disposition: A | Discharge status: Home / Self Care | Payer: Medicare Other | Source: Ambulatory Visit | Attending: Ophthalmology | Admitting: Ophthalmology

## 2017-07-11 DIAGNOSIS — I10 Essential (primary) hypertension: Secondary | ICD-10-CM | POA: Insufficient documentation

## 2017-07-11 DIAGNOSIS — E079 Disorder of thyroid, unspecified: Secondary | ICD-10-CM | POA: Diagnosis not present

## 2017-07-11 DIAGNOSIS — F17210 Nicotine dependence, cigarettes, uncomplicated: Secondary | ICD-10-CM | POA: Diagnosis not present

## 2017-07-11 DIAGNOSIS — Z79899 Other long term (current) drug therapy: Secondary | ICD-10-CM | POA: Diagnosis not present

## 2017-07-11 DIAGNOSIS — Z7982 Long term (current) use of aspirin: Secondary | ICD-10-CM | POA: Diagnosis not present

## 2017-07-11 DIAGNOSIS — H269 Unspecified cataract: Secondary | ICD-10-CM | POA: Diagnosis not present

## 2017-07-11 HISTORY — PX: CATARACT EXTRACTION W/PHACO: SHX586

## 2017-07-11 SURGERY — PHACOEMULSIFICATION, CATARACT, WITH IOL INSERTION
Anesthesia: Monitor Anesthesia Care | Site: Eye | Laterality: Left

## 2017-07-11 MED ORDER — NEOMYCIN-POLYMYXIN-DEXAMETH 3.5-10000-0.1 OP SUSP
OPHTHALMIC | Status: DC | PRN
  Administered 2017-07-11: 2 [drp] via OPHTHALMIC

## 2017-07-11 MED ORDER — LIDOCAINE HCL 3.5 % OP GEL
1.0000 "application " | Freq: Once | OPHTHALMIC | Status: AC
  Administered 2017-07-11: 1 via OPHTHALMIC

## 2017-07-11 MED ORDER — BSS IO SOLN
INTRAOCULAR | Status: DC | PRN
  Administered 2017-07-11: 15 mL via INTRAOCULAR

## 2017-07-11 MED ORDER — LACTATED RINGERS IV SOLN
INTRAVENOUS | Status: DC
  Administered 2017-07-11: 08:00:00 via INTRAVENOUS

## 2017-07-11 MED ORDER — LIDOCAINE HCL (PF) 1 % IJ SOLN
INTRAMUSCULAR | Status: DC | PRN
  Administered 2017-07-11: 1 mL via OPHTHALMIC

## 2017-07-11 MED ORDER — TRYPAN BLUE 0.06 % OP SOLN
OPHTHALMIC | Status: DC | PRN
  Administered 2017-07-11: 0.5 mL via INTRAOCULAR

## 2017-07-11 MED ORDER — GLYCOPYRROLATE 0.2 MG/ML IJ SOLN
0.2000 mg | Freq: Once | INTRAMUSCULAR | Status: AC
  Administered 2017-07-11: 0.2 mg via INTRAVENOUS
  Filled 2017-07-11: qty 1

## 2017-07-11 MED ORDER — EPINEPHRINE PF 1 MG/ML IJ SOLN
INTRAMUSCULAR | Status: AC
  Filled 2017-07-11: qty 2

## 2017-07-11 MED ORDER — SODIUM HYALURONATE 23 MG/ML IO SOLN
INTRAOCULAR | Status: DC | PRN
  Administered 2017-07-11: 0.6 mL via INTRAOCULAR

## 2017-07-11 MED ORDER — ATROPINE SULFATE 0.4 MG/ML IJ SOLN
INTRAMUSCULAR | Status: AC
  Filled 2017-07-11: qty 1

## 2017-07-11 MED ORDER — PHENYLEPHRINE HCL 2.5 % OP SOLN
1.0000 [drp] | OPHTHALMIC | Status: AC
  Administered 2017-07-11 (×3): 1 [drp] via OPHTHALMIC

## 2017-07-11 MED ORDER — TETRACAINE HCL 0.5 % OP SOLN
1.0000 [drp] | OPHTHALMIC | Status: AC
  Administered 2017-07-11 (×3): 1 [drp] via OPHTHALMIC

## 2017-07-11 MED ORDER — PROVISC 10 MG/ML IO SOLN
INTRAOCULAR | Status: DC | PRN
  Administered 2017-07-11: 0.85 mL via INTRAOCULAR

## 2017-07-11 MED ORDER — CYCLOPENTOLATE-PHENYLEPHRINE 0.2-1 % OP SOLN
1.0000 [drp] | OPHTHALMIC | Status: AC
  Administered 2017-07-11 (×3): 1 [drp] via OPHTHALMIC

## 2017-07-11 MED ORDER — MIDAZOLAM HCL 2 MG/2ML IJ SOLN
1.0000 mg | INTRAMUSCULAR | Status: AC
  Administered 2017-07-11: 2 mg via INTRAVENOUS
  Filled 2017-07-11: qty 2

## 2017-07-11 MED ORDER — POVIDONE-IODINE 5 % OP SOLN
OPHTHALMIC | Status: DC | PRN
  Administered 2017-07-11: 1 via OPHTHALMIC

## 2017-07-11 MED ORDER — TRYPAN BLUE 0.06 % OP SOLN
OPHTHALMIC | Status: AC
  Filled 2017-07-11: qty 0.5

## 2017-07-11 MED ORDER — EPINEPHRINE PF 1 MG/ML IJ SOLN
INTRAOCULAR | Status: DC | PRN
  Administered 2017-07-11: 500 mL

## 2017-07-11 SURGICAL SUPPLY — 13 items
CLOTH BEACON ORANGE TIMEOUT ST (SAFETY) ×3 IMPLANT
EYE SHIELD UNIVERSAL CLEAR (GAUZE/BANDAGES/DRESSINGS) ×3 IMPLANT
GLOVE BIOGEL PI IND STRL 7.0 (GLOVE) ×2 IMPLANT
GLOVE BIOGEL PI INDICATOR 7.0 (GLOVE) ×4
LENS ALC ACRYL/TECN (Ophthalmic Related) ×3 IMPLANT
NEEDLE HYPO 18GX1.5 BLUNT FILL (NEEDLE) ×3 IMPLANT
PAD ARMBOARD 7.5X6 YLW CONV (MISCELLANEOUS) ×3 IMPLANT
RING MALYGIN (MISCELLANEOUS) IMPLANT
SYR TB 1ML LL NO SAFETY (SYRINGE) ×3 IMPLANT
TAPE SURG TRANSPORE 1 IN (GAUZE/BANDAGES/DRESSINGS) ×1 IMPLANT
TAPE SURGICAL TRANSPORE 1 IN (GAUZE/BANDAGES/DRESSINGS) ×2
VISCOELASTIC ADDITIONAL (OPHTHALMIC RELATED) ×3 IMPLANT
WATER STERILE IRR 250ML POUR (IV SOLUTION) ×3 IMPLANT

## 2017-07-11 NOTE — H&P (Signed)
The H and P was reviewed and updated. The patient was examined.  No changes were found after exam.  The surgical eye was marked.  

## 2017-07-11 NOTE — Op Note (Signed)
Date of procedure: 07/11/17  Pre-operative diagnosis:  Visually significant cataract, Left Eye  Post-operative diagnosis: Visually significant cataract, Left Eye  Procedure: Removal of cataract via phacoemulsification and insertion of intra-ocular lens AMO PCB00 +18.5D into the capsular bag of the Left Eye  Attending surgeon: Gerda Diss. Omair Dettmer, MD, MA  Anesthesia: MAC, Topical Akten  Complications: None  Estimated Blood Loss: <74m (minimal)  Specimens: None  Implants: As above  Indications:  Visually significant cataract, Left Eye  Procedure:  The patient was seen and identified in the pre-operative area. The operative eye was identified and dilated.  The operative eye was marked.  Topical anesthesia was administered to the operative eye.     The patient was then to the operative suite and placed in the supine position.  A timeout was performed confirming the patient, procedure to be performed, and all other relevant information.   The patient's face was prepped and draped in the usual fashion for intra-ocular surgery.  A lid speculum was placed into the operative eye and the surgical microscope moved into place and focused.  A lack of red reflex due to a mature cataract was confirmed.  A superotemporal paracentesis was created using a 20 gauge paracentesis blade.  Vision blue was injected into the anterior chamber.  Shugarcaine was injected into the anterior chamber.  Viscoelastic was injected into the anterior chamber.  A temporal clear-corneal main wound incision was created using a 2.468mmicrokeratome.  A continuous curvilinear capsulorrhexis was initiated using an irrigating cystitome and completed using capsulorrhexis forceps.  Hydrodissection and hydrodeliniation were performed.  Viscoelastic was injected into the anterior chamber.  A phacoemulsification handpiece and a chopper as a second instrument were used to remove the nucleus and epinucleus. The irrigation/aspiration handpiece  was used to remove any remaining cortical material.   The capsular bag was reinflated with viscoelastic, checked, and found to be intact. The intraocular lens was inserted into the capsular bag and dialed into place using a kuglen hook.  The irrigation/aspiration handpiece was used to remove any remaining viscoelastic.  The clear corneal wound and paracentesis wounds were then hydrated and checked with Weck-Cels to be watertight.  The lid-speculum and drape was removed, and the patient's face was cleaned with a wet and dry 4x4.  Maxitrol was instilled in the eye before a clear shield was taped over the eye. The patient was taken to the post-operative care unit in good condition, having tolerated the procedure well.  Post-Op Instructions: The [facility] and will receive all other orders and instructions.

## 2017-07-11 NOTE — Anesthesia Postprocedure Evaluation (Signed)
Anesthesia Post Note  Patient: Nathan [name]  Procedure(s) Performed: CATARACT EXTRACTION PHACO AND INTRAOCULAR LENS PLACEMENT LEFT EYE (Left Eye)  Patient location during evaluation: Short Stay Anesthesia Type: MAC Level of consciousness: awake and alert Pain management: pain level controlled Vital Signs Assessment: post-procedure vital signs reviewed and stable Respiratory status: spontaneous breathing, nonlabored ventilation and respiratory function stable Cardiovascular status: bradycardic Postop Assessment: no apparent nausea or vomiting Anesthetic complications: no     Last Vitals:  Vitals:   07/11/17 0825 07/11/17 0830  BP: (!) 151/66 (!) 158/62  Resp: 15 18  SpO2: 100% 100%    Last Pain: There were no vitals filed for this visit.               Jordi Kamm J

## 2017-07-11 NOTE — Discharge Instructions (Signed)
Please discharge patient when stable, will follow up today with Dr. Marisa Hua at the [facility] office immediately after discharge.  Leave shield in place until visit.  All paperwork with discharge instructions will be given at the office.   Monitored Anesthesia Care, Care After These instructions provide you with information about caring for yourself after your procedure. Your health care provider may also give you more specific instructions. Your treatment has been planned according to current medical practices, but problems sometimes occur. Call your health care provider if you have any problems or questions after your procedure. What can I expect after the procedure? After your procedure, it is common to:  Feel sleepy for several hours.  Feel clumsy and have poor balance for several hours.  Feel forgetful about what happened after the procedure.  Have poor judgment for several hours.  Feel nauseous or vomit.  Have a sore throat if you had a breathing tube during the procedure.  Follow these instructions at home: For at least 24 hours after the procedure:   Do not: ? Participate in activities in which you could fall or become injured. ? Drive. ? Use heavy machinery. ? Drink alcohol. ? Take sleeping pills or medicines that cause drowsiness. ? Make important decisions or sign legal documents. ? Take care of children on your own.  Rest. Eating and drinking  Follow the diet that is recommended by your health care provider.  If you vomit, drink water, juice, or soup when you can drink without vomiting.  Make sure you have little or no nausea before eating solid foods. General instructions  Have a responsible adult stay with you until you are awake and alert.  Take over-the-counter and prescription medicines only as told by your health care provider.  If you smoke, do not smoke without supervision.  Keep all follow-up visits as told by your health care provider.  This is important. Contact a health care provider if:  You keep feeling nauseous or you keep vomiting.  You feel light-headed.  You develop a rash.  You have a fever. Get help right away if:  You have trouble breathing. This information is not intended to replace advice given to you by your health care provider. Make sure you discuss any questions you have with your health care provider. Document Released: 12/31/2015 Document Revised: 05/01/2016 Document Reviewed: 12/31/2015 Elsevier Interactive Patient Education  [employer].

## 2017-07-11 NOTE — Transfer of Care (Signed)
Immediate Anesthesia Transfer of Care Note  Patient: Nathan [name]  Procedure(s) Performed: CATARACT EXTRACTION PHACO AND INTRAOCULAR LENS PLACEMENT LEFT EYE (Left Eye)  Patient Location: Short Stay  Anesthesia Type:MAC  Level of Consciousness: awake and patient cooperative  Airway & Oxygen Therapy: Patient Spontanous Breathing  Post-op Assessment: Report given to RN and Post -op Vital signs reviewed and stable  Post vital signs: Reviewed and stable  Last Vitals:  Vitals:   07/11/17 0825 07/11/17 0830  BP: (!) 151/66 (!) 158/62  Resp: 15 18  SpO2: 100% 100%    Last Pain: There were no vitals filed for this visit.       Complications: No apparent anesthesia complications

## 2017-07-11 NOTE — Anesthesia Preprocedure Evaluation (Signed)
Anesthesia Evaluation  Patient identified by MRN, date of birth, ID band Patient awake    Reviewed: Allergy & Precautions, NPO status , Patient's Chart, lab work & pertinent test results  Airway Mallampati: II  TM Distance: >3 FB     Dental  (+) Edentulous Upper, Teeth Intact   Pulmonary Current Smoker,    breath sounds clear to auscultation       Cardiovascular hypertension, Pt. on medications + dysrhythmias + Valvular Problems/Murmurs MR  Rhythm:Regular Rate:Bradycardia  Severe bradycardia without syncope or angina. Cardiology evaluated and clear for this surgery.   Neuro/Psych    GI/Hepatic negative GI ROS, Neg liver ROS,   Endo/Other  negative endocrine ROS  Renal/GU      Musculoskeletal   Abdominal   Peds  Hematology  (+) anemia ,   Anesthesia Other Findings   Reproductive/Obstetrics                             Anesthesia Physical Anesthesia Plan  ASA: III  Anesthesia Plan: MAC   Post-op Pain Management:    Induction: Intravenous  PONV Risk Score and Plan:   Airway Management Planned: Nasal Cannula  Additional Equipment:   Intra-op Plan:   Post-operative Plan:   Informed Consent: I have reviewed the patients History and Physical, chart, labs and discussed the procedure including the risks, benefits and alternatives for the proposed anesthesia with the patient or authorized representative who has indicated his/her understanding and acceptance.     Plan Discussed with:   Anesthesia Plan Comments:         Anesthesia Quick Evaluation

## 2017-07-14 ENCOUNTER — Encounter (HOSPITAL_COMMUNITY): Payer: Self-pay | Admitting: Ophthalmology

## 2017-07-21 ENCOUNTER — Encounter (HOSPITAL_COMMUNITY)
Admission: RE | Admit: 2017-07-21 | Discharge: 2017-07-21 | Disposition: A | Discharge status: Home / Self Care | Payer: Medicare Other | Source: Ambulatory Visit | Attending: Ophthalmology | Admitting: Ophthalmology

## 2017-07-21 ENCOUNTER — Encounter (HOSPITAL_COMMUNITY): Payer: Self-pay

## 2017-07-25 ENCOUNTER — Encounter (HOSPITAL_COMMUNITY): Payer: Self-pay | Admitting: *Deleted

## 2017-07-25 ENCOUNTER — Ambulatory Visit (HOSPITAL_COMMUNITY): Payer: Medicare Other | Admitting: Anesthesiology

## 2017-07-25 ENCOUNTER — Ambulatory Visit (HOSPITAL_COMMUNITY)
Admission: RE | Admit: 2017-07-25 | Discharge: 2017-07-25 | Disposition: A | Discharge status: Home / Self Care | Payer: Medicare Other | Source: Ambulatory Visit | Attending: Ophthalmology | Admitting: Ophthalmology

## 2017-07-25 ENCOUNTER — Encounter (HOSPITAL_COMMUNITY)
Admission: RE | Disposition: A | Discharge status: Home / Self Care | Payer: Self-pay | Source: Ambulatory Visit | Attending: Ophthalmology

## 2017-07-25 DIAGNOSIS — Z7982 Long term (current) use of aspirin: Secondary | ICD-10-CM | POA: Diagnosis not present

## 2017-07-25 DIAGNOSIS — Z79899 Other long term (current) drug therapy: Secondary | ICD-10-CM | POA: Insufficient documentation

## 2017-07-25 DIAGNOSIS — H269 Unspecified cataract: Secondary | ICD-10-CM | POA: Insufficient documentation

## 2017-07-25 DIAGNOSIS — F17210 Nicotine dependence, cigarettes, uncomplicated: Secondary | ICD-10-CM | POA: Diagnosis not present

## 2017-07-25 DIAGNOSIS — I10 Essential (primary) hypertension: Secondary | ICD-10-CM | POA: Insufficient documentation

## 2017-07-25 HISTORY — PX: CATARACT EXTRACTION W/PHACO: SHX586

## 2017-07-25 SURGERY — PHACOEMULSIFICATION, CATARACT, WITH IOL INSERTION
Anesthesia: Monitor Anesthesia Care | Site: Eye | Laterality: Right

## 2017-07-25 MED ORDER — LACTATED RINGERS IV SOLN
INTRAVENOUS | Status: DC
  Administered 2017-07-25: 07:00:00 via INTRAVENOUS

## 2017-07-25 MED ORDER — MIDAZOLAM HCL 2 MG/2ML IJ SOLN
INTRAMUSCULAR | Status: AC
  Filled 2017-07-25: qty 2

## 2017-07-25 MED ORDER — SODIUM HYALURONATE 23 MG/ML IO SOLN
INTRAOCULAR | Status: DC | PRN
  Administered 2017-07-25: 0.6 mL via INTRAOCULAR

## 2017-07-25 MED ORDER — PROVISC 10 MG/ML IO SOLN
INTRAOCULAR | Status: DC | PRN
  Administered 2017-07-25: 0.85 mL via INTRAOCULAR

## 2017-07-25 MED ORDER — ATROPINE SULFATE 0.4 MG/ML IJ SOLN
0.4000 mg | Freq: Once | INTRAMUSCULAR | Status: AC
  Administered 2017-07-25: 0.4 mg via INTRAVENOUS
  Filled 2017-07-25: qty 1

## 2017-07-25 MED ORDER — SODIUM CHLORIDE 0.9 % IJ SOLN
INTRAMUSCULAR | Status: AC
  Filled 2017-07-25: qty 10

## 2017-07-25 MED ORDER — NEOMYCIN-POLYMYXIN-DEXAMETH 3.5-10000-0.1 OP SUSP
OPHTHALMIC | Status: DC | PRN
  Administered 2017-07-25: 2 [drp] via OPHTHALMIC

## 2017-07-25 MED ORDER — EPINEPHRINE PF 1 MG/ML IJ SOLN
INTRAMUSCULAR | Status: AC
  Filled 2017-07-25: qty 1

## 2017-07-25 MED ORDER — POVIDONE-IODINE 5 % OP SOLN
OPHTHALMIC | Status: DC | PRN
  Administered 2017-07-25: 1 via OPHTHALMIC

## 2017-07-25 MED ORDER — CYCLOPENTOLATE-PHENYLEPHRINE 0.2-1 % OP SOLN
1.0000 [drp] | OPHTHALMIC | Status: AC
Start: 2017-07-25 — End: 2017-07-25
  Administered 2017-07-25 (×3): 1 [drp] via OPHTHALMIC

## 2017-07-25 MED ORDER — ATROPINE SULFATE 1 MG/ML IJ SOLN
INTRAMUSCULAR | Status: AC
  Filled 2017-07-25: qty 1

## 2017-07-25 MED ORDER — LIDOCAINE HCL 3.5 % OP GEL
1.0000 "application " | Freq: Once | OPHTHALMIC | Status: AC
  Administered 2017-07-25: 1 via OPHTHALMIC

## 2017-07-25 MED ORDER — BSS IO SOLN
INTRAOCULAR | Status: DC | PRN
  Administered 2017-07-25: 15 mL

## 2017-07-25 MED ORDER — EPHEDRINE SULFATE 50 MG/ML IJ SOLN
INTRAMUSCULAR | Status: AC
  Filled 2017-07-25: qty 1

## 2017-07-25 MED ORDER — LIDOCAINE HCL (PF) 1 % IJ SOLN
INTRAOCULAR | Status: DC | PRN
  Administered 2017-07-25: 1 mL via OPHTHALMIC

## 2017-07-25 MED ORDER — TETRACAINE HCL 0.5 % OP SOLN
1.0000 [drp] | OPHTHALMIC | Status: AC
  Administered 2017-07-25 (×3): 1 [drp] via OPHTHALMIC

## 2017-07-25 MED ORDER — EPINEPHRINE PF 1 MG/ML IJ SOLN
INTRAMUSCULAR | Status: DC | PRN
  Administered 2017-07-25: 500 mL

## 2017-07-25 MED ORDER — ATROPINE SULFATE 0.4 MG/ML IJ SOLN
INTRAMUSCULAR | Status: AC
  Filled 2017-07-25: qty 1

## 2017-07-25 MED ORDER — PHENYLEPHRINE HCL 2.5 % OP SOLN
1.0000 [drp] | OPHTHALMIC | Status: AC
  Administered 2017-07-25 (×3): 1 [drp] via OPHTHALMIC

## 2017-07-25 MED ORDER — MIDAZOLAM HCL 2 MG/2ML IJ SOLN
1.0000 mg | INTRAMUSCULAR | Status: AC
  Administered 2017-07-25: 2 mg via INTRAVENOUS

## 2017-07-25 SURGICAL SUPPLY — 13 items
CLOTH BEACON ORANGE TIMEOUT ST (SAFETY) ×3 IMPLANT
EYE SHIELD UNIVERSAL CLEAR (GAUZE/BANDAGES/DRESSINGS) ×3 IMPLANT
GLOVE BIOGEL PI IND STRL 6.5 (GLOVE) ×1 IMPLANT
GLOVE BIOGEL PI IND STRL 7.0 (GLOVE) ×1 IMPLANT
GLOVE BIOGEL PI INDICATOR 6.5 (GLOVE) ×2
GLOVE BIOGEL PI INDICATOR 7.0 (GLOVE) ×2
LENS ALC ACRYL/TECN (Ophthalmic Related) ×3 IMPLANT
PAD ARMBOARD 7.5X6 YLW CONV (MISCELLANEOUS) ×3 IMPLANT
SYR TB 1ML LL NO SAFETY (SYRINGE) ×3 IMPLANT
TAPE SURG TRANSPORE 1 IN (GAUZE/BANDAGES/DRESSINGS) ×1 IMPLANT
TAPE SURGICAL TRANSPORE 1 IN (GAUZE/BANDAGES/DRESSINGS) ×2
VISCOELASTIC ADDITIONAL (OPHTHALMIC RELATED) ×3 IMPLANT
WATER STERILE IRR 250ML POUR (IV SOLUTION) ×3 IMPLANT

## 2017-07-25 NOTE — Op Note (Signed)
Date of procedure: 07/25/17  Pre-operative diagnosis: Visually significant cataract, Right Eye  Post-operative diagnosis: Visually significant cataract, Right Eye  Procedure: Removal of cataract via phacoemulsification and insertion of intra-ocular lens AMO PCB00  +18.5D into the capsular bag of the Right Eye  Attending surgeon: Gerda Diss. Vonda Harth, MD, MA  Anesthesia: MAC, Topical Akten  Complications: None  Estimated Blood Loss: <77m (minimal)  Specimens: None  Implants: As above  Indications:  Visually significant cataract, Right Eye  Procedure:  The patient was seen and identified in the pre-operative area. The operative eye was identified and dilated.  The operative eye was marked.  Topical anesthesia was administered to the operative eye.     The patient was then to the operative suite and placed in the supine position.  A timeout was performed confirming the patient, procedure to be performed, and all other relevant information.   The patient's face was prepped and draped in the usual fashion for intra-ocular surgery.  A lid speculum was placed into the operative eye and the surgical microscope moved into place and focused.  A superotemporal paracentesis was created using a 20 gauge paracentesis blade.  Shugarcaine was injected into the anterior chamber.  Viscoelastic was injected into the anterior chamber.  A temporal clear-corneal main wound incision was created using a 2.454mmicrokeratome.  A continuous curvilinear capsulorrhexis was initiated using an irrigating cystitome and completed using capsulorrhexis forceps.  Hydrodissection and hydrodeliniation were performed.  Viscoelastic was injected into the anterior chamber.  A phacoemulsification handpiece and a chopper as a second instrument were used to remove the nucleus and epinucleus. The irrigation/aspiration handpiece was used to remove any remaining cortical material.   The capsular bag was reinflated with viscoelastic,  checked, and found to be intact.  The intraocular lens was inserted into the capsular bag and dialed into place using a Kuglen hook.  The irrigation/aspiration handpiece was used to remove any remaining viscoelastic.  The clear corneal wound and paracentesis wounds were then hydrated and checked with Weck-Cels to be watertight.  The lid-speculum and drape was removed, and the patient's face was cleaned with a wet and dry 4x4.  Maxitrol was instilled in the eye before a clear shield was taped over the eye. The patient was taken to the post-operative care unit in good condition, having tolerated the procedure well.  Post-Op Instructions: [facility] and will receive all other orders and instructions.

## 2017-07-25 NOTE — Anesthesia Preprocedure Evaluation (Signed)
Anesthesia Evaluation  Patient identified by MRN, date of birth, ID band Patient awake    Reviewed: Allergy & Precautions, NPO status , Patient's Chart, lab work & pertinent test results  Airway Mallampati: II  TM Distance: >3 FB     Dental  (+) Edentulous Upper, Teeth Intact   Pulmonary Current Smoker,    breath sounds clear to auscultation       Cardiovascular hypertension, Pt. on medications + dysrhythmias + Valvular Problems/Murmurs MR  Rhythm:Regular Rate:Bradycardia  Severe bradycardia without syncope or angina. Cardiology evaluated and clear for this surgery.   Neuro/Psych    GI/Hepatic negative GI ROS, Neg liver ROS,   Endo/Other  negative endocrine ROS  Renal/GU      Musculoskeletal   Abdominal   Peds  Hematology  (+) anemia ,   Anesthesia Other Findings   Reproductive/Obstetrics                             Anesthesia Physical Anesthesia Plan  ASA: III  Anesthesia Plan: MAC   Post-op Pain Management:    Induction: Intravenous  PONV Risk Score and Plan:   Airway Management Planned: Nasal Cannula  Additional Equipment:   Intra-op Plan:   Post-operative Plan:   Informed Consent: I have reviewed the patients History and Physical, chart, labs and discussed the procedure including the risks, benefits and alternatives for the proposed anesthesia with the patient or authorized representative who has indicated his/her understanding and acceptance.     Plan Discussed with:   Anesthesia Plan Comments:         Anesthesia Quick Evaluation

## 2017-07-25 NOTE — Anesthesia Procedure Notes (Signed)
Procedure Name: MAC Date/Time: 07/25/2017 7:25 AM Performed by: Andree Elk, Loanne Emery A Pre-anesthesia Checklist: Patient identified, Timeout performed, Suction available, Patient being monitored and Emergency Drugs available Oxygen Delivery Method: Nasal cannula

## 2017-07-25 NOTE — Transfer of Care (Signed)
Immediate Anesthesia Transfer of Care Note  Patient: Nathan [name]  Procedure(s) Performed: CATARACT EXTRACTION PHACO AND INTRAOCULAR LENS PLACEMENT RIGHT EYE CDE=5.32 (Right Eye)  Patient Location: Short Stay  Anesthesia Type:MAC  Level of Consciousness: awake, alert , oriented and patient cooperative  Airway & Oxygen Therapy: Patient Spontanous Breathing  Post-op Assessment: Report given to RN and Post -op Vital signs reviewed and stable  Post vital signs: Reviewed and stable  Last Vitals:  Vitals:   07/25/17 0700 07/25/17 0715  BP:  140/63  Resp: (!) 44 (!) 39  Temp:    SpO2: 95% 100%    Last Pain:  Vitals:   07/25/17 0638  TempSrc: Oral      Patients Stated Pain Goal: 6 (08/81/10 3159)  Complications: No apparent anesthesia complications

## 2017-07-25 NOTE — H&P (Signed)
The H and P was reviewed and updated. The patient was examined.  No changes were found after exam.  The surgical eye was marked.  

## 2017-07-25 NOTE — Anesthesia Postprocedure Evaluation (Signed)
Anesthesia Post Note  Patient: Nathan [name]  Procedure(s) Performed: CATARACT EXTRACTION PHACO AND INTRAOCULAR LENS PLACEMENT RIGHT EYE CDE=5.32 (Right Eye)  Patient location during evaluation: Short Stay Anesthesia Type: MAC Level of consciousness: awake and alert, oriented and patient cooperative Pain management: pain level controlled Vital Signs Assessment: post-procedure vital signs reviewed and stable Respiratory status: spontaneous breathing Cardiovascular status: stable Postop Assessment: no apparent nausea or vomiting Anesthetic complications: no     Last Vitals:  Vitals:   07/25/17 0700 07/25/17 0715  BP:  140/63  Resp: (!) 44 (!) 39  Temp:    SpO2: 95% 100%    Last Pain:  Vitals:   07/25/17 0638  TempSrc: Oral                 ADAMS, AMY A

## 2017-07-25 NOTE — Discharge Instructions (Signed)
Please discharge patient when stable, will follow up today with Dr. Marisa Hua at the [facility] at 8:30AM.  Leave shield in place until visit.  All paperwork with discharge instructions will be given at the office.

## 2017-07-28 ENCOUNTER — Encounter (HOSPITAL_COMMUNITY): Payer: Self-pay | Admitting: Ophthalmology

## 2018-05-14 ENCOUNTER — Ambulatory Visit (INDEPENDENT_AMBULATORY_CARE_PROVIDER_SITE_OTHER): Payer: Medicare Other | Admitting: Otolaryngology

## 2018-05-14 DIAGNOSIS — H6123 Impacted cerumen, bilateral: Secondary | ICD-10-CM | POA: Diagnosis not present

## 2018-05-14 DIAGNOSIS — H903 Sensorineural hearing loss, bilateral: Secondary | ICD-10-CM | POA: Diagnosis not present

## 2019-05-20 ENCOUNTER — Ambulatory Visit (INDEPENDENT_AMBULATORY_CARE_PROVIDER_SITE_OTHER): Payer: Medicare Other | Admitting: Otolaryngology

## 2019-05-20 DIAGNOSIS — H903 Sensorineural hearing loss, bilateral: Secondary | ICD-10-CM

## 2019-08-04 DIAGNOSIS — M109 Gout, unspecified: Secondary | ICD-10-CM | POA: Insufficient documentation

## 2019-08-04 DIAGNOSIS — R809 Proteinuria, unspecified: Secondary | ICD-10-CM | POA: Insufficient documentation

## 2020-04-28 ENCOUNTER — Ambulatory Visit (INDEPENDENT_AMBULATORY_CARE_PROVIDER_SITE_OTHER): Payer: Medicare Other | Admitting: Cardiology

## 2020-04-28 ENCOUNTER — Encounter: Payer: Self-pay | Admitting: Cardiology

## 2020-04-28 ENCOUNTER — Other Ambulatory Visit: Payer: Self-pay

## 2020-04-28 VITALS — BP 120/50 | HR 41 | Ht 69.0 in | Wt 151.0 lb

## 2020-04-28 DIAGNOSIS — R001 Bradycardia, unspecified: Secondary | ICD-10-CM

## 2020-04-28 NOTE — Progress Notes (Signed)
Clinical Summary Mr. Wyss is a 84 y.o.male seen today for follow up of the following medical problems.   1. Sinus bradycardia - long history of sinus brady that has been asymptomatic per prior notes. Thyroid studies have been normal, has not been on AV nodal agents - has been asymptomatic historically despite significant sinus brady - seen by EP, plan for watchful waiting.   - no recent symptoms   2. HTN -compliant with meds  3. Hyperlipidemia - compliant with stattin    SH: wife passed November 2017 due to cancer.   Completed covid vaccine   Past Medical History:  Diagnosis Date  . Anemia, normocytic normochromic    H&H of 12.3/35.5 in 01/2012  . Auditory impairment    OS  . Bradycardia    Evaluated by Dr. Cletus Gash in 2009  . Carcinoma of prostate ([place]) 2004  . Gout    Last symptomatic episode in 2006  . Hyperlipidemia    Lipid profile in 01/2012:203, 475, 37, X  . Hypertension   . Systolic murmur    Consistent with mitral regurgitation  . Tobacco abuse    0.5 pack per day; 30 pack years     No Known Allergies   Current Outpatient Medications  Medication Sig Dispense Refill  . allopurinol (ZYLOPRIM) 300 MG tablet Take 300 mg by mouth daily.    Marland Kitchen aspirin EC 81 MG tablet Take 81 mg by mouth daily.    . cholecalciferol (VITAMIN D) 1000 units tablet Take 1,000 Units by mouth 3 (three) times a week.     Marland Kitchen lisinopril (PRINIVIL,ZESTRIL) 20 MG tablet Take 20 mg by mouth daily.    . simvastatin (ZOCOR) 40 MG tablet Take 40 mg by mouth daily at 6 PM.     . sodium bicarbonate 650 MG tablet Take 650-1,300 mg by mouth 2 (two) times daily. Takes 2 tabs in the morning and 1 in the afternoon     No current facility-administered medications for this visit.     Past Surgical History:  Procedure Laterality Date  . bone spur Right    elbow  . CATARACT EXTRACTION W/PHACO Left 07/11/2017   Procedure: CATARACT EXTRACTION PHACO AND INTRAOCULAR LENS PLACEMENT  LEFT EYE;  Surgeon: Baruch Goldmann, MD;  Location: [facility];  Service: Ophthalmology;  Laterality: Left;  CDE: 7.35  . CATARACT EXTRACTION W/PHACO Right 07/25/2017   Procedure: CATARACT EXTRACTION PHACO AND INTRAOCULAR LENS PLACEMENT RIGHT EYE CDE=5.32;  Surgeon: Baruch Goldmann, MD;  Location: [facility];  Service: Ophthalmology;  Laterality: Right;  right  . COLONOSCOPY  10/02/2012   Procedure: COLONOSCOPY;  Surgeon: Danie Binder, MD;  Location: [facility] SUITE;  Service: Endoscopy;  Laterality: N/A;  10:30 AM  . PROSTATE SURGERY       No Known Allergies    Family History  Problem Relation Age of Onset  . Heart disease Mother   . Arrhythmia Mother        brother(brady)  . Coronary artery disease Neg Hx   . Colon cancer Neg Hx      Social History Mr. Medinger reports that he has been smoking cigarettes. He started smoking about 62 years ago. He has a 27.50 pack-year smoking history. He has never used smokeless tobacco. Mr. Dohrmann reports current alcohol use of about 4.0 standard drinks of alcohol per week.   Review of Systems CONSTITUTIONAL: No weight loss, fever, chills, weakness or fatigue.  HEENT: Eyes: No visual loss, blurred vision, double vision  or yellow sclerae.No hearing loss, sneezing, congestion, runny nose or sore throat.  SKIN: No rash or itching.  CARDIOVASCULAR: per hpi RESPIRATORY: No shortness of breath, cough or sputum.  GASTROINTESTINAL: No anorexia, nausea, vomiting or diarrhea. No abdominal pain or blood.  GENITOURINARY: No burning on urination, no polyuria NEUROLOGICAL: No headache, dizziness, syncope, paralysis, ataxia, numbness or tingling in the extremities. No change in bowel or bladder control.  MUSCULOSKELETAL: No muscle, back pain, joint pain or stiffness.  LYMPHATICS: No enlarged nodes. No history of splenectomy.  PSYCHIATRIC: No history of depression or anxiety.  ENDOCRINOLOGIC: No reports of sweating, cold or heat intolerance. No polyuria or  polydipsia.  Marland Kitchen   Physical Examination Today's Vitals   04/28/20 1445  BP: (!) 120/50  Pulse: (!) 41  SpO2: 92%  Weight: 151 lb (68.5 kg)  Height: 5\' 9"  (1.753 m)   Body mass index is 22.3 kg/m.  Gen: resting comfortably, no acute distress HEENT: no scleral icterus, pupils equal round and reactive, no palptable cervical adenopathy,  CV: regular, brady Resp: Clear to auscultation bilaterally GI: abdomen is soft, non-tender, non-distended, normal bowel sounds, no hepatosplenomegaly MSK: extremities are warm, no edema.  Skin: warm, no rash Neuro:  no focal deficits Psych: appropriate affect   Diagnostic Studies     Assessment and Plan  1. Sinus bradycardia -long history of sinus bradycardia, he has been asymptomatic.y. - no symptoms at this time, remains physically active. Continue to monitor -EKG today sinus brady 41    2. HTN -at goal, continue meds  3. Hyperlipidemia -continue statin, labs followed by pcp    F/u 6 months  Arnoldo Lenis, M.D.

## 2020-04-28 NOTE — Patient Instructions (Signed)
Medication Instructions:  Your physician recommends that you continue on your current medications as directed. Please refer to the Current Medication list given to you today.  *If you need a refill on your cardiac medications before your next appointment, please call your pharmacy*   Lab Work: None today If you have labs (blood work) drawn today and your tests are completely normal, you will receive your results only by: . MyChart Message (if you have MyChart) OR . A paper copy in the mail If you have any lab test that is abnormal or we need to change your treatment, we will call you to review the results.   Testing/Procedures: None today   Follow-Up: At [facility], you and your health needs are our priority.  As part of our continuing mission to provide you with exceptional heart care, we have created designated Provider Care Teams.  These Care Teams include your primary Cardiologist (physician) and Advanced Practice Providers (APPs -  Physician Assistants and Nurse Practitioners) who all work together to provide you with the care you need, when you need it.  We recommend signing up for the patient portal called "MyChart".  Sign up information is provided on this After Visit Summary.  MyChart is used to connect with patients for Virtual Visits (Telemedicine).  Patients are able to view lab/test results, encounter notes, upcoming appointments, etc.  Non-urgent messages can be sent to your provider as well.   To learn more about what you can do with MyChart, go to [email or web address].    Your next appointment:   6 month(s)  The format for your next appointment:   In Person  Provider:   Jonathan Branch, MD   Other Instructions None       Thank you for choosing [facility] !         

## 2020-10-19 ENCOUNTER — Ambulatory Visit: Payer: Medicare Other | Admitting: Student

## 2020-10-19 ENCOUNTER — Encounter: Payer: Self-pay | Admitting: *Deleted

## 2020-10-19 ENCOUNTER — Encounter: Payer: Self-pay | Admitting: Student

## 2020-10-19 ENCOUNTER — Other Ambulatory Visit: Payer: Self-pay

## 2020-10-19 VITALS — BP 128/68 | HR 48 | Ht 69.0 in | Wt 151.0 lb

## 2020-10-19 DIAGNOSIS — I10 Essential (primary) hypertension: Secondary | ICD-10-CM | POA: Diagnosis not present

## 2020-10-19 DIAGNOSIS — E782 Mixed hyperlipidemia: Secondary | ICD-10-CM | POA: Diagnosis not present

## 2020-10-19 DIAGNOSIS — N1832 Chronic kidney disease, stage 3b: Secondary | ICD-10-CM

## 2020-10-19 DIAGNOSIS — R001 Bradycardia, unspecified: Secondary | ICD-10-CM | POA: Diagnosis not present

## 2020-10-19 NOTE — Patient Instructions (Signed)

## 2020-10-19 NOTE — Progress Notes (Signed)
Cardiology Office Note    Date:  10/19/2020   ID:  Nathan [name], DOB [birth year], MRN [id number]  PCP:  Rosita Fire, MD  Cardiologist: Carlyle Dolly, MD    Chief Complaint  Patient presents with  . Follow-up    6 month visit    History of Present Illness:    Nathan [name] is a 85 y.o. male with past medical history of asymptomatic sinus bradycardia, HTN, HLD, Stage 3 CKD and history of prostate cancer who presents to the office today for 60-month follow-up.  He was last examined by Dr. Harl Bowie in 04/2020 and denied any recent lightheadedness or dizziness at that time. He had previously been evaluated by EP for his bradycardia with watchful waiting recommended given his asymptomatic state.  In talking with the patient today, he reports overall doing well from a cardiac perspective since his last visit. He does live by himself, therefore he performs all of his household chores. He participates in [employer] Whe[employer] and is unsure if extra salt is added to the food. Does not consume fast food regularly. He denies any recent lightheadedness, dizziness or presyncope. No recent chest pain or dyspnea on exertion. No orthopnea, PND or lower extremity edema.   Past Medical History:  Diagnosis Date  . Anemia, normocytic normochromic    H&H of 12.3/35.5 in 01/2012  . Auditory impairment    OS  . Bradycardia    Evaluated by Dr. Cletus Gash in 2009  . Carcinoma of prostate ([place]) 2004  . Gout    Last symptomatic episode in 2006  . Hyperlipidemia    Lipid profile in 01/2012:203, 475, 37, X  . Hypertension   . Systolic murmur    Consistent with mitral regurgitation  . Tobacco abuse    0.5 pack per day; 30 pack years    Past Surgical History:  Procedure Laterality Date  . bone spur Right    elbow  . CATARACT EXTRACTION W/PHACO Left 07/11/2017   Procedure: CATARACT EXTRACTION PHACO AND INTRAOCULAR LENS PLACEMENT LEFT EYE;  Surgeon: Baruch Goldmann, MD;  Location: [facility];  Service:  Ophthalmology;  Laterality: Left;  CDE: 7.35  . CATARACT EXTRACTION W/PHACO Right 07/25/2017   Procedure: CATARACT EXTRACTION PHACO AND INTRAOCULAR LENS PLACEMENT RIGHT EYE CDE=5.32;  Surgeon: Baruch Goldmann, MD;  Location: [facility];  Service: Ophthalmology;  Laterality: Right;  right  . COLONOSCOPY  10/02/2012   Procedure: COLONOSCOPY;  Surgeon: Danie Binder, MD;  Location: [facility];  Service: Endoscopy;  Laterality: N/A;  10:30 AM  . PROSTATE SURGERY      Current Medications: Outpatient Medications Prior to Visit  Medication Sig Dispense Refill  . allopurinol (ZYLOPRIM) 300 MG tablet Take 300 mg by mouth daily.    Marland Kitchen amLODipine (NORVASC) 5 MG tablet Take 5 mg by mouth daily.    Marland Kitchen aspirin EC 81 MG tablet Take 81 mg by mouth daily.    . cholecalciferol (VITAMIN D) 1000 units tablet Take 1,000 Units by mouth 3 (three) times a week.     Marland Kitchen lisinopril (PRINIVIL,ZESTRIL) 20 MG tablet Take 20 mg by mouth daily.    . simvastatin (ZOCOR) 40 MG tablet Take 40 mg by mouth daily at 6 PM.     . sodium bicarbonate 650 MG tablet Take 650-1,300 mg by mouth 2 (two) times daily. Takes 2 tabs in the morning and 1 in the afternoon     No facility-administered medications prior to visit.     Allergies:  Patient has no known allergies.   Social History   Socioeconomic History  . Marital status: Widowed    Spouse name: Not on file  . Number of children: Not on file  . Years of education: Not on file  . Highest education level: Not on file  Occupational History  . Occupation: Retired    Fish farm manager: [employer]  Tobacco Use  . Smoking status: Current Every Day Smoker    Packs/day: 0.50    Years: 55.00    Pack years: 27.50    Types: Cigarettes    Start date: 12/29/1957  . Smokeless tobacco: Never Used  Vaping Use  . Vaping Use: Never used  Substance and Sexual Activity  . Alcohol use: Yes    Alcohol/week: 4.0 standard drinks    Types: 4 Standard drinks or equivalent per week  . Drug use:  No  . Sexual activity: Not Currently    Birth control/protection: None  Other Topics Concern  . Not on file  Social History Narrative  . Not on file   Social Determinants of Health   Financial Resource Strain: Not on file  Food Insecurity: Not on file  Transportation Needs: Not on file  Physical Activity: Not on file  Stress: Not on file  Social Connections: Not on file     Family History:  The patient's family history includes Arrhythmia in his mother; Heart disease in his mother.   Review of Systems:   Please see the history of present illness.     General:  No chills, fever, night sweats or weight changes.  Cardiovascular:  No chest pain, dyspnea on exertion, edema, orthopnea, palpitations, paroxysmal nocturnal dyspnea. Dermatological: No rash, lesions/masses Respiratory: No cough, dyspnea Urologic: No hematuria, dysuria Abdominal:   No nausea, vomiting, diarrhea, bright red blood per rectum, melena, or hematemesis Neurologic:  No visual changes, wkns, changes in mental status. All other systems reviewed and are otherwise negative except as noted above.   Physical Exam:    VS:  BP 128/68   Pulse (!) 48   Ht 5\' 9"  (1.753 m)   Wt 151 lb (68.5 kg)   BMI 22.30 kg/m    General: Pleasant elderly male appearing in no acute distress. Head: Normocephalic, atraumatic. Neck: No carotid bruits. JVD not elevated.  Lungs: Respirations regular and unlabored, without wheezes or rales.  Heart: Regular rhythm, bradycardiac rate. No S3 or S4.  No murmur, no rubs, or gallops appreciated. Abdomen: Appears non-distended. No obvious abdominal masses. Msk:  Strength and tone appear normal for age. No obvious joint deformities or effusions. Extremities: No clubbing or cyanosis. No lower extremity edema.  Distal pedal pulses are 2+ bilaterally. Neuro: Alert and oriented X 3. Moves all extremities spontaneously. No focal deficits noted. Psych:  Responds to questions appropriately with a  normal affect. Skin: No rashes or lesions noted  Wt Readings from Last 3 Encounters:  10/19/20 151 lb (68.5 kg)  04/28/20 151 lb (68.5 kg)  07/07/17 155 lb (70.3 kg)     Studies/Labs Reviewed:   EKG:  EKG is not ordered today. EKG from 04/28/2020 is reviewed and shows sinus bradycardia, HR 41 with PAC's.   Recent Labs: No results found for requested labs within last 8760 hours.   Lipid Panel    Component Value Date/Time   CHOL 136 03/23/2012 0908   TRIG 162 (H) 03/23/2012 0908   HDL 36 (L) 03/23/2012 0908   CHOLHDL 3.8 03/23/2012 0908   VLDL 32 03/23/2012 0908  [place] 68 03/23/2012 0908   LDLDIRECT 60 03/23/2012 0908    Additional studies/ records that were reviewed today include:   Renal US: 10/2009 IMPRESSION:  Subtle increase in echodensity of the renal cortex. This would be  compatible with nonspecific renal medical disease. No  hydronephrosis  Assessment:    1. Bradycardia   2. Essential hypertension   3. Mixed hyperlipidemia   4. Stage 3b chronic kidney disease ([place])      Plan:   In order of problems listed above:  1. Bradycardia - His HR was in the 40's during today's visit which has been his typical baseline for several years by review of notes.  He remains active for his age and denies any lightheadedness, dizziness or presyncope. We again reviewed signs to monitor for. Will continue with watchful waiting at this time as previously recommended by EP given his asymptomatic state. Continue to avoid AV nodal blocking agents.  2.  Hypertension - BP was initially recorded at 146/58, rechecked and improved to 128/68. He reports this has been well controlled when his daughter checks his BP at home. Continue Amlodipine 5 mg daily and Lisinopril 20 mg daily.  Will defer continuation of his ACE-I to Nephrology.   3. HLD - He is on Simvastatin 40 mg daily and the maximum recommended dose of this is 20 mg daily given concurrent use of Amlodipine. I will request  a copy of most recent labs from his PCP. If LDL is close to goal, would recommend reducing Simvastatin to 20 mg daily. If above goal, would stop Simvastatin and switch to Crestor.  4. Stage 3 CKD - Followed by Dr. Theador Hawthorne. Creatinine was at 1.88 in 07/2020.   Medication Adjustments/Labs and Tests Ordered: Current medicines are reviewed at length with the patient today.  Concerns regarding medicines are outlined above.  Medication changes, Labs and Tests ordered today are listed in the Patient Instructions below. Patient Instructions  Medication Instructions:  Your physician recommends that you continue on your current medications as directed. Please refer to the Current Medication list given to you today.  *If you need a refill on your cardiac medications before your next appointment, please call your pharmacy*   Lab Work: NONE   If you have labs (blood work) drawn today and your tests are completely normal, you will receive your results only by: Marland Kitchen MyChart Message (if you have MyChart) OR . A paper copy in the mail If you have any lab test that is abnormal or we need to change your treatment, we will call you to review the results.   Testing/Procedures: NONE    Follow-Up: At [facility], you and your health needs are our priority.  As part of our continuing mission to provide you with exceptional heart care, we have created designated Provider Care Teams.  These Care Teams include your primary Cardiologist (physician) and Advanced Practice Providers (APPs -  Physician Assistants and Nurse Practitioners) who all work together to provide you with the care you need, when you need it.  We recommend signing up for the patient portal called "MyChart".  Sign up information is provided on this After Visit Summary.  MyChart is used to connect with patients for Virtual Visits (Telemedicine).  Patients are able to view lab/test results, encounter notes, upcoming appointments, etc.  Non-urgent  messages can be sent to your provider as well.   To learn more about what you can do with MyChart, go to [email or web address].    Your next  appointment:   6 month(s)  The format for your next appointment:   In Person  Provider:   Dr. Harl Bowie   Other Instructions Thank you for choosing [facility]!     Signed, Erma Heritage, PA-C  10/19/2020 4:57 PM    [facility] [address] [place], [place] [address] Phone: [phone] Fax: [phone]

## 2020-10-20 ENCOUNTER — Other Ambulatory Visit: Payer: Self-pay

## 2020-10-20 ENCOUNTER — Telehealth: Payer: Self-pay

## 2020-10-20 DIAGNOSIS — Z79899 Other long term (current) drug therapy: Secondary | ICD-10-CM

## 2020-10-20 MED ORDER — SIMVASTATIN 20 MG PO TABS: 20.0000 mg | ORAL_TABLET | Freq: Every day | ORAL | 3 refills | Status: DC

## 2020-10-20 NOTE — Progress Notes (Signed)
Dose change for Simvastatin 40 mg to 20 mg daily.

## 2020-10-20 NOTE — Progress Notes (Signed)
FLP orders placed

## 2020-10-20 NOTE — Telephone Encounter (Signed)
Contacted patient and gave him results. He verbalized understanding and would like to have FLP done at [facility] instead of PCP. Dose change for Simvastatin 20 mg sent.

## 2020-10-20 NOTE — Telephone Encounter (Signed)
-----   Message from Erma Heritage, [place] sent at 10/20/2020 10:49 AM EST ----- Please let the patient know I reviewed recent labs from his PCP. Cholesterol was well controlled at that time. Given the interaction between Simvastatin and Amlodipine, I would recommend reducing Simvastatin to 20 mg daily and his Rx will need to be updated. Would recommend a FLP with [place] or his PCP in 3 months. If cholesterol has significant worsened, would switch to Crestor.

## 2020-10-20 NOTE — Telephone Encounter (Signed)
-----   Message from Brittany M Strader, PA-C sent at 10/20/2020 10:49 AM EST ----- Please let the patient know I reviewed recent labs from his PCP. Cholesterol was well controlled at that time. Given the interaction between Simvastatin and Amlodipine, I would recommend reducing Simvastatin to 20 mg daily and his Rx will need to be updated. Would recommend a FLP with us or his PCP in 3 months. If cholesterol has significant worsened, would switch to Crestor.  

## 2021-01-02 ENCOUNTER — Emergency Department (HOSPITAL_COMMUNITY): Payer: Medicare Other

## 2021-01-02 ENCOUNTER — Encounter (HOSPITAL_COMMUNITY): Payer: Self-pay | Admitting: Emergency Medicine

## 2021-01-02 ENCOUNTER — Emergency Department (HOSPITAL_COMMUNITY)
Admission: EM | Admit: 2021-01-02 | Discharge: 2021-01-02 | Disposition: A | Discharge status: Home / Self Care | Payer: Medicare Other | Attending: Emergency Medicine | Admitting: Emergency Medicine

## 2021-01-02 ENCOUNTER — Other Ambulatory Visit: Payer: Self-pay

## 2021-01-02 DIAGNOSIS — N189 Chronic kidney disease, unspecified: Secondary | ICD-10-CM | POA: Insufficient documentation

## 2021-01-02 DIAGNOSIS — U071 COVID-19: Secondary | ICD-10-CM | POA: Insufficient documentation

## 2021-01-02 DIAGNOSIS — Z79899 Other long term (current) drug therapy: Secondary | ICD-10-CM | POA: Insufficient documentation

## 2021-01-02 DIAGNOSIS — I129 Hypertensive chronic kidney disease with stage 1 through stage 4 chronic kidney disease, or unspecified chronic kidney disease: Secondary | ICD-10-CM | POA: Insufficient documentation

## 2021-01-02 DIAGNOSIS — Z7982 Long term (current) use of aspirin: Secondary | ICD-10-CM | POA: Diagnosis not present

## 2021-01-02 DIAGNOSIS — R197 Diarrhea, unspecified: Secondary | ICD-10-CM | POA: Diagnosis present

## 2021-01-02 DIAGNOSIS — Z8546 Personal history of malignant neoplasm of prostate: Secondary | ICD-10-CM | POA: Insufficient documentation

## 2021-01-02 DIAGNOSIS — N390 Urinary tract infection, site not specified: Secondary | ICD-10-CM | POA: Diagnosis not present

## 2021-01-02 DIAGNOSIS — B9689 Other specified bacterial agents as the cause of diseases classified elsewhere: Secondary | ICD-10-CM | POA: Diagnosis not present

## 2021-01-02 DIAGNOSIS — F17210 Nicotine dependence, cigarettes, uncomplicated: Secondary | ICD-10-CM | POA: Diagnosis not present

## 2021-01-02 LAB — COMPREHENSIVE METABOLIC PANEL
ALT: 13 U/L (ref 0–44)
AST: 18 U/L (ref 15–41)
Albumin: 4.6 g/dL (ref 3.5–5.0)
Alkaline Phosphatase: 65 U/L (ref 38–126)
Anion gap: 10 (ref 5–15)
BUN: 30 mg/dL — ABNORMAL HIGH (ref 8–23)
CO2: 23 mmol/L (ref 22–32)
Calcium: 9.5 mg/dL (ref 8.9–10.3)
Chloride: 106 mmol/L (ref 98–111)
Creatinine, Ser: 1.98 mg/dL — ABNORMAL HIGH (ref 0.61–1.24)
GFR, Estimated: 33 mL/min — ABNORMAL LOW (ref >60)
Glucose, Bld: 100 mg/dL — ABNORMAL HIGH (ref 70–99)
Potassium: 4 mmol/L (ref 3.5–5.1)
Sodium: 139 mmol/L (ref 135–145)
Total Bilirubin: 0.4 mg/dL (ref 0.3–1.2)
Total Protein: 7.9 g/dL (ref 6.5–8.1)

## 2021-01-02 LAB — CBC
HCT: 32.1 % — ABNORMAL LOW (ref 39.0–52.0)
Hemoglobin: 10.7 g/dL — ABNORMAL LOW (ref 13.0–17.0)
MCH: 29.2 pg (ref 26.0–34.0)
MCHC: 33.3 g/dL (ref 30.0–36.0)
MCV: 87.5 fL (ref 80.0–100.0)
Platelets: 159 10*3/uL (ref 150–400)
RBC: 3.67 MIL/uL — ABNORMAL LOW (ref 4.22–5.81)
RDW: 14.6 % (ref 11.5–15.5)
WBC: 5.4 10*3/uL (ref 4.0–10.5)
nRBC: 0 % (ref 0.0–0.2)

## 2021-01-02 LAB — URINALYSIS, ROUTINE W REFLEX MICROSCOPIC
Bilirubin Urine: NEGATIVE
Glucose, UA: NEGATIVE mg/dL
Ketones, ur: NEGATIVE mg/dL
Nitrite: POSITIVE — AB
Protein, ur: NEGATIVE mg/dL
Specific Gravity, Urine: 1.01 (ref 1.005–1.030)
pH: 6 (ref 5.0–8.0)

## 2021-01-02 LAB — LIPASE, BLOOD: Lipase: 36 U/L (ref 11–51)

## 2021-01-02 MED ORDER — CEPHALEXIN 500 MG PO CAPS: 500.0000 mg | ORAL_CAPSULE | Freq: Once | ORAL | Status: DC

## 2021-01-02 MED ORDER — CEPHALEXIN 500 MG PO CAPS: 500.0000 mg | ORAL_CAPSULE | Freq: Three times a day (TID) | ORAL | 0 refills | Status: DC

## 2021-01-02 MED ORDER — SODIUM CHLORIDE 0.9 % IV SOLN
1.0000 g | Freq: Once | INTRAVENOUS | Status: AC
  Administered 2021-01-02: 1 g via INTRAVENOUS
  Filled 2021-01-02: qty 10

## 2021-01-02 MED ORDER — NIRMATRELVIR/RITONAVIR (PAXLOVID)TABLET: ORAL_TABLET | ORAL | 0 refills | Status: DC

## 2021-01-02 NOTE — ED Triage Notes (Signed)
Pt tested + for covid from a home test. Pt reports he has had diarrhea that started last night and had some dizziness around 0200 this morning.  Pt states he feels fine now.  Son and pcp would like pt checked out.  Pt a&o x 4, ambulating independently with steady gait.

## 2021-01-02 NOTE — ED Triage Notes (Signed)
Emergency Medicine Provider Triage Evaluation Note  Nathan [name] , a 85 y.o. male  was evaluated in triage.  Pt complains of diarrhea and dizziness which began at 2 AM, took a home Covid test around 10 AM today which was positive.  Patient is vaccinated.  Reports he has not really been having upper respiratory symptoms, denies chest pain or shortness of breath.  Felt dizzy which concerned him.  Review of Systems  Positive: Diarrhea, dizziness Negative: Fever, cough, chest pain, shortness of breath, vomiting  Physical Exam  BP 125/88 (BP Location: Right Arm)   Pulse (!) 111   Temp 98 F (36.7 C) (Oral)   Resp 18   SpO2 97%  Gen:   Awake, no distress HEENT:  Atraumatic  Resp:  Normal effort, lungs CTA bilateral Cardiac:  Normal rate  Abd:   Nondistended, nontender  MSK:   Moves extremities without difficulty  Neuro:  Speech clear   Medical Decision Making  Medically screening exam initiated at 1:09 PM.  Appropriate orders placed.  Nathan [name] was informed that the remainder of the evaluation will be completed by another provider, this initial triage assessment does not replace that evaluation, and the importance of remaining in the ED until their evaluation is complete.  Clinical Impression  Covid positive Diarrhea Dizziness   Jacqlyn Larsen, [place] 01/02/21 1311

## 2021-01-02 NOTE — Discharge Instructions (Addendum)
Stop your simvastatin at least 12 hours before starting the prescribed COVID medication.  Also your urinalysis today shows that you have a urinary tract infection.  You have been prescribed an antibiotic to take for this along with the medication for the Covid infection.  Please follow-up with your primary doctor for recheck.  Return to the emergency department for any worsening symptoms.

## 2021-01-03 NOTE — ED Provider Notes (Signed)
[facility] EMERGENCY DEPARTMENT Provider Note   CSN: [id number] Arrival date & time: 01/02/21  1233     History Chief Complaint  Patient presents with  . Covid Positive    Nathan [name] is a 85 y.o. male.  HPI     Nathan [name] is a 85 y.o. male who presents to the Emergency Department complaining of diarrhea and dizziness that began early on the morning of arrival. Describes stools as loose, but non bloody or black.  His dizziness is described as sense of movement and spinning associated with position change.  He took a home covid test and had a positive result.  He denies any fever, chills, chest pain, cough, shortness of breath, and abdominal pain.  No headache or neck pain or stiffness. He is vaccinated for Covid   Past Medical History:  Diagnosis Date  . Anemia, normocytic normochromic    H&H of 12.3/35.5 in 01/2012  . Auditory impairment    OS  . Bradycardia    Evaluated by Dr. Cletus Gash in 2009  . Carcinoma of prostate ([place]) 2004  . Gout    Last symptomatic episode in 2006  . Hyperlipidemia    Lipid profile in 01/2012:203, 475, 37, X  . Hypertension   . Systolic murmur    Consistent with mitral regurgitation  . Tobacco abuse    0.5 pack per day; 30 pack years    Patient Active Problem List   Diagnosis Date Noted  . Chronic kidney disease 02/23/2012  . Fasting hyperglycemia 02/23/2012  . Anemia, normocytic normochromic   . Systolic murmur   . Bradycardia   . Hypertension   . Carcinoma of prostate ([place])   . Tobacco abuse     Past Surgical History:  Procedure Laterality Date  . bone spur Right    elbow  . CATARACT EXTRACTION W/PHACO Left 07/11/2017   Procedure: CATARACT EXTRACTION PHACO AND INTRAOCULAR LENS PLACEMENT LEFT EYE;  Surgeon: Baruch Goldmann, MD;  Location: [facility];  Service: Ophthalmology;  Laterality: Left;  CDE: 7.35  . CATARACT EXTRACTION W/PHACO Right 07/25/2017   Procedure: CATARACT EXTRACTION PHACO AND INTRAOCULAR LENS PLACEMENT  RIGHT EYE CDE=5.32;  Surgeon: Baruch Goldmann, MD;  Location: [facility];  Service: Ophthalmology;  Laterality: Right;  right  . COLONOSCOPY  10/02/2012   Procedure: COLONOSCOPY;  Surgeon: Danie Binder, MD;  Location: [facility];  Service: Endoscopy;  Laterality: N/A;  10:30 AM  . PROSTATE SURGERY         Family History  Problem Relation Age of Onset  . Heart disease Mother   . Arrhythmia Mother        brother(brady)  . Coronary artery disease Neg Hx   . Colon cancer Neg Hx     Social History   Tobacco Use  . Smoking status: Current Every Day Smoker    Packs/day: 0.50    Years: 55.00    Pack years: 27.50    Types: Cigarettes    Start date: 12/29/1957  . Smokeless tobacco: Never Used  Vaping Use  . Vaping Use: Never used  Substance Use Topics  . Alcohol use: Yes    Alcohol/week: 4.0 standard drinks    Types: 4 Standard drinks or equivalent per week  . Drug use: No    Home Medications Prior to Admission medications   Medication Sig Start Date End Date Taking? Authorizing Provider  cephALEXin (KEFLEX) 500 MG capsule Take 1 capsule (500 mg total) by mouth 3 (three) times daily.  01/02/21  Yes Mosiah Bastin, PA-C  nirmatrelvir/ritonavir EUA (PAXLOVID) TABS Patient GFR is 33. Take nirmatrelvir (150 mg) 1 tablet(s) twice daily for 5 days and ritonavir (100 mg) one tablet twice daily for 5 days. 01/02/21  Yes Janny Crute, PA-C  allopurinol (ZYLOPRIM) 300 MG tablet Take 300 mg by mouth daily.    [provider]  amLODipine (NORVASC) 5 MG tablet Take 5 mg by mouth daily. 04/07/20   [provider]  aspirin EC 81 MG tablet Take 81 mg by mouth daily.    [provider]  cholecalciferol (VITAMIN D) 1000 units tablet Take 1,000 Units by mouth 3 (three) times a week.     [provider]  lisinopril (PRINIVIL,ZESTRIL) 20 MG tablet Take 20 mg by mouth daily.    [provider]  simvastatin (ZOCOR) 20 MG tablet Take 1 tablet (20 mg total) by  mouth at bedtime. 10/20/20 01/18/21  Strader, Fransisco Hertz, PA-C  sodium bicarbonate 650 MG tablet Take 650-1,300 mg by mouth 2 (two) times daily. Takes 2 tabs in the morning and 1 in the afternoon    [provider]    Allergies    Patient has no known allergies.  Review of Systems   Review of Systems  Constitutional: Negative for appetite change, chills, fatigue and fever.  HENT: Negative for congestion, sore throat and trouble swallowing.   Eyes: Negative for visual disturbance.  Respiratory: Negative for cough, chest tightness, shortness of breath and wheezing.   Cardiovascular: Negative for chest pain and palpitations.  Gastrointestinal: Positive for diarrhea. Negative for abdominal pain, nausea and vomiting.  Genitourinary: Negative for decreased urine volume, dysuria, flank pain and hematuria.  Musculoskeletal: Negative for myalgias, neck pain and neck stiffness.  Skin: Negative for rash.  Neurological: Positive for dizziness. Negative for seizures, syncope, weakness, numbness and headaches.  Hematological: Does not bruise/bleed easily.  Psychiatric/Behavioral: Negative for confusion.    Physical Exam Updated Vital Signs BP 126/62   Pulse (!) 56   Temp 98 F (36.7 C) (Oral)   Resp 13   SpO2 100%   Physical Exam Vitals and nursing note reviewed.  Constitutional:      General: He is not in acute distress.    Appearance: Normal appearance. He is not ill-appearing or toxic-appearing.  HENT:     Head: Atraumatic.     Mouth/Throat:     Mouth: Mucous membranes are moist.  Eyes:     Extraocular Movements: Extraocular movements intact.     Conjunctiva/sclera: Conjunctivae normal.     Pupils: Pupils are equal, round, and reactive to light.  Cardiovascular:     Rate and Rhythm: Normal rate and regular rhythm.     Pulses: Normal pulses.  Pulmonary:     Effort: Pulmonary effort is normal.     Breath sounds: Normal breath sounds.  Abdominal:     Palpations: Abdomen  is soft.     Tenderness: There is no abdominal tenderness. There is no guarding or rebound.  Musculoskeletal:        General: No tenderness. Normal range of motion.     Cervical back: Normal range of motion and neck supple.     Right lower leg: No edema.     Left lower leg: No edema.  Lymphadenopathy:     Cervical: No cervical adenopathy.  Skin:    General: Skin is warm.     Capillary Refill: Capillary refill takes less than 2 seconds.     Findings: No rash.  Neurological:     General: No focal deficit present.     Mental Status: He is alert and oriented to person, place, and time.     Sensory: Sensation is intact. No sensory deficit.     Motor: Motor function is intact. No weakness.     Coordination: Coordination is intact.     Comments: CN II-XII intact. No pronator drift. Grip strength symmetrical.  Psychiatric:        Judgment: Judgment normal.     ED Results / Procedures / Treatments   Labs (all labs ordered are listed, but only abnormal results are displayed) Labs Reviewed  COMPREHENSIVE METABOLIC PANEL - Abnormal; Notable for the following components:      Result Value   Glucose, Bld 100 (*)    BUN 30 (*)    Creatinine, Ser 1.98 (*)    GFR, Estimated 33 (*)    All other components within normal limits  CBC - Abnormal; Notable for the following components:   RBC 3.67 (*)    Hemoglobin 10.7 (*)    HCT 32.1 (*)    All other components within normal limits  URINALYSIS, ROUTINE W REFLEX MICROSCOPIC - Abnormal; Notable for the following components:   Hgb urine dipstick MODERATE (*)    Nitrite POSITIVE (*)    Leukocytes,Ua TRACE (*)    Bacteria, UA RARE (*)    All other components within normal limits  URINE CULTURE  LIPASE, BLOOD    EKG EKG Interpretation  Date/Time:  Tuesday January 02 2021 13:15:26 EDT Ventricular Rate:  55 PR Interval:    QRS Duration: 112 QT Interval:  410 QTC Calculation: 393 R Axis:   38 Text Interpretation: Atrial fibrillation  Incomplete right bundle branch block Nonspecific T abnormalities, lateral leads No acute changes No significant change since last tracing Nonspecific ST and T wave abnormality Confirmed by Varney Biles ([id number]) on 01/03/2021 9:06:28 PM   Radiology DG Chest Port 1 View  Result Date: 01/02/2021 CLINICAL DATA:  Positive COVID-19 test. EXAM: PORTABLE CHEST 1 VIEW COMPARISON:  None. FINDINGS: The heart size and mediastinal contours are within normal limits. Both lungs are clear. The visualized skeletal structures are unremarkable. IMPRESSION: No active disease. Aortic Atherosclerosis (ICD10-I70.0). Electronically Signed   By: Marijo Conception M.D.   On: 01/02/2021 13:43    Procedures Procedures   Medications Ordered in ED Medications  cefTRIAXone (ROCEPHIN) 1 g in sodium chloride 0.9 % 100 mL IVPB (0 g Intravenous Stopped 01/02/21 1543)    ED Course  I have reviewed the triage vital signs and the nursing notes.  Pertinent labs & imaging results that were available during my care of the patient were reviewed by me and considered in my medical decision making (see chart for details).    MDM Rules/Calculators/A&P                          Pt here for evaluation of diarrhea and dizziness that began in the early morning hours.  Diarrhea described as loose and non bloody or tarry.  He took a home covid test that was positive.    On exam, pt well appearing.  Vitals reassuring and abdomen is non tender.  Labs show renal insuffiency near baseline.  No leukocytosis.   CXR w/o acute findings.   U/A shows UTI. Pt given IV Rocephin here.  He was seen by Dr. Roderic Palau and care plan discussed.    On recheck pt is  feeling better. Appears appropriate for d/c home.  Will prescribe Keflex and Paxlovid. Pt has GFR of 33, Discussed adjusted dosing with pharmacy.  Pt agrees to close f/u and return precautions discussed  The patient appears reasonably screened and/or stabilized for discharge and I doubt any other  medical condition or other [facility] requiring further screening, evaluation, or treatment in the ED at this time prior to discharge.    Final Clinical Impression(s) / ED Diagnoses Final diagnoses:  [id number] virus infection  Urinary tract infection in male    Rx / DC Orders ED Discharge Orders         Ordered    nirmatrelvir/ritonavir EUA (PAXLOVID) TABS        01/02/21 1509    cephALEXin (KEFLEX) 500 MG capsule  3 times daily        01/02/21 1509           Kem Parkinson, PA-C 01/03/21 2226    Milton Ferguson, MD 01/09/21 [phone]

## 2021-01-04 LAB — URINE CULTURE: Culture: >=100000 — AB

## 2021-01-05 LAB — URINE CULTURE

## 2021-01-24 ENCOUNTER — Other Ambulatory Visit: Payer: Self-pay

## 2021-01-24 ENCOUNTER — Other Ambulatory Visit (HOSPITAL_COMMUNITY)
Admission: RE | Admit: 2021-01-24 | Discharge: 2021-01-24 | Disposition: A | Discharge status: Home / Self Care | Payer: Medicare Other | Source: Ambulatory Visit | Attending: Student | Admitting: Student

## 2021-01-24 DIAGNOSIS — Z79899 Other long term (current) drug therapy: Secondary | ICD-10-CM | POA: Diagnosis present

## 2021-01-24 LAB — LIPID PANEL
Cholesterol: 130 mg/dL (ref 0–200)
HDL: 38 mg/dL — ABNORMAL LOW (ref >40)
LDL Cholesterol: 67 mg/dL (ref 0–99)
Total CHOL/HDL Ratio: 3.4 RATIO
Triglycerides: 125 mg/dL (ref <150)
VLDL: 25 mg/dL (ref 0–40)

## 2021-04-18 ENCOUNTER — Ambulatory Visit: Payer: Medicare Other | Admitting: Cardiology

## 2021-05-02 ENCOUNTER — Other Ambulatory Visit: Payer: Self-pay

## 2021-05-02 ENCOUNTER — Ambulatory Visit: Payer: Medicare Other | Admitting: Cardiology

## 2021-05-02 ENCOUNTER — Encounter: Payer: Self-pay | Admitting: Cardiology

## 2021-05-02 VITALS — BP 115/56 | HR 50 | Ht 69.0 in | Wt 150.0 lb

## 2021-05-02 DIAGNOSIS — R001 Bradycardia, unspecified: Secondary | ICD-10-CM

## 2021-05-02 DIAGNOSIS — I10 Essential (primary) hypertension: Secondary | ICD-10-CM

## 2021-05-02 DIAGNOSIS — E782 Mixed hyperlipidemia: Secondary | ICD-10-CM

## 2021-05-02 NOTE — Patient Instructions (Signed)
Medication Instructions:  Your physician recommends that you continue on your current medications as directed. Please refer to the Current Medication list given to you today.  *If you need a refill on your cardiac medications before your next appointment, please call your pharmacy*   Lab Work: None If you have labs (blood work) drawn today and your tests are completely normal, you will receive your results only by: [place] (if you have MyChart) OR A paper copy in the mail If you have any lab test that is abnormal or we need to change your treatment, we will call you to review the results.   Testing/Procedures: None   Follow-Up: At [facility], you and your health needs are our priority.  As part of our continuing mission to provide you with exceptional heart care, we have created designated Provider Care Teams.  These Care Teams include your primary Cardiologist (physician) and Advanced Practice Providers (APPs -  Physician Assistants and Nurse Practitioners) who all work together to provide you with the care you need, when you need it.  We recommend signing up for the patient portal called "MyChart".  Sign up information is provided on this After Visit Summary.  MyChart is used to connect with patients for Virtual Visits (Telemedicine).  Patients are able to view lab/test results, encounter notes, upcoming appointments, etc.  Non-urgent messages can be sent to your provider as well.   To learn more about what you can do with MyChart, go to [email or web address].    Your next appointment:   1 year(s)  The format for your next appointment:   In Person  Provider:   Carlyle Dolly, MD   Other Instructions

## 2021-05-02 NOTE — Progress Notes (Signed)
Clinical Summary Mr. Carten is a 85 y.o.maleseen today for follow up of the following medical problems.   1. Sinus bradycardia - long history of sinus brady that has been asymptomatic per prior notes. Thyroid studies have been normal, has not been on AV nodal agents - has been asymptomatic historically despite significant sinus brady - seen by EP, plan for watchful waiting.    - no recent dizziness, no lightheadedness, no presyncope, or syncope     2. HTN -compliant with meds   3. Hyperlipidemia - labs followed by pcp - he is on simvastatin   4. CKD 3b - followed by Dr Theador Hawthorne   [facility]: wife passed November 2017 due to cancer.    Past Medical History:  Diagnosis Date   Anemia, normocytic normochromic    H&H of 12.3/35.5 in 01/2012   Auditory impairment    OS   Bradycardia    Evaluated by Dr. Cletus Gash in 2009   Carcinoma of prostate [facility]) 2004   Gout    Last symptomatic episode in 2006   Hyperlipidemia    Lipid profile in 01/2012:203, 475, 37, X   Hypertension    Systolic murmur    Consistent with mitral regurgitation   Tobacco abuse    0.5 pack per day; 30 pack years     No Known Allergies   Current Outpatient Medications  Medication Sig Dispense Refill   allopurinol (ZYLOPRIM) 300 MG tablet Take 300 mg by mouth daily.     amLODipine (NORVASC) 5 MG tablet Take 5 mg by mouth daily.     aspirin EC 81 MG tablet Take 81 mg by mouth daily.     cephALEXin (KEFLEX) 500 MG capsule Take 1 capsule (500 mg total) by mouth 3 (three) times daily. 21 capsule 0   cholecalciferol (VITAMIN D) 1000 units tablet Take 1,000 Units by mouth 3 (three) times a week.      lisinopril (PRINIVIL,ZESTRIL) 20 MG tablet Take 20 mg by mouth daily.     nirmatrelvir/ritonavir EUA (PAXLOVID) TABS Patient GFR is 33. Take nirmatrelvir (150 mg) 1 tablet(s) twice daily for 5 days and ritonavir (100 mg) one tablet twice daily for 5 days. 20 tablet 0   simvastatin (ZOCOR) 20 MG tablet Take 1 tablet  (20 mg total) by mouth at bedtime. 90 tablet 3   sodium bicarbonate 650 MG tablet Take 650-1,300 mg by mouth 2 (two) times daily. Takes 2 tabs in the morning and 1 in the afternoon     No current facility-administered medications for this visit.     Past Surgical History:  Procedure Laterality Date   bone spur Right    elbow   CATARACT EXTRACTION W/PHACO Left 07/11/2017   Procedure: CATARACT EXTRACTION PHACO AND INTRAOCULAR LENS PLACEMENT LEFT EYE;  Surgeon: Baruch Goldmann, MD;  Location: [facility];  Service: Ophthalmology;  Laterality: Left;  CDE: 7.35   CATARACT EXTRACTION W/PHACO Right 07/25/2017   Procedure: CATARACT EXTRACTION PHACO AND INTRAOCULAR LENS PLACEMENT RIGHT EYE CDE=5.32;  Surgeon: Baruch Goldmann, MD;  Location: [facility] ORS;  Service: Ophthalmology;  Laterality: Right;  right   COLONOSCOPY  10/02/2012   Procedure: COLONOSCOPY;  Surgeon: Danie Binder, MD;  Location: [facility] SU[facility];  Service: Endoscopy;  Laterality: N/A;  10:30 AM   PROSTATE SURGERY       No Known Allergies    Family History  Problem Relation Age of Onset   Heart disease Mother    Arrhythmia Mother  brother(brady)   Coronary artery disease Neg Hx    Colon cancer Neg Hx      Social History Mr. Livers reports that he has been smoking cigarettes. He started smoking about 63 years ago. He has a 27.50 pack-year smoking history. He has never used smokeless tobacco. Mr. Stilson reports current alcohol use of about 4.0 standard drinks of alcohol per week.   Review of Systems CONSTITUTIONAL: No weight loss, fever, chills, weakness or fatigue.  HEENT: Eyes: No visual loss, blurred vision, double vision or yellow sclerae.No hearing loss, sneezing, congestion, runny nose or sore throat.  SKIN: No rash or itching.  CARDIOVASCULAR: per hpi RESPIRATORY: No shortness of breath, cough or sputum.  GASTROINTESTINAL: No anorexia, nausea, vomiting or diarrhea. No abdominal pain or blood.  GENITOURINARY: No  burning on urination, no polyuria NEUROLOGICAL: No headache, dizziness, syncope, paralysis, ataxia, numbness or tingling in the extremities. No change in bowel or bladder control.  MUSCULOSKELETAL: No muscle, back pain, joint pain or stiffness.  LYMPHATICS: No enlarged nodes. No history of splenectomy.  PSYCHIATRIC: No history of depression or anxiety.  ENDOCRINOLOGIC: No reports of sweating, cold or heat intolerance. No polyuria or polydipsia.  Marland Kitchen   Physical Examination Today's Vitals   05/02/21 0935  BP: (!) 115/56  Pulse: (!) 50  SpO2: 100%  Weight: 150 lb (68 kg)  Height: '5\' 9"'$  (1.753 m)   Body mass index is 22.15 kg/m.  Gen: resting comfortably, no acute distress HEENT: no scleral icterus, pupils equal round and reactive, no palptable cervical adenopathy,  CV: regular, brady, no m/r/g, no jvd Resp: Clear to auscultation bilaterally GI: abdomen is soft, non-tender, non-distended, normal bowel sounds, no hepatosplenomegaly MSK: extremities are warm, no edema.  Skin: warm, no rash Neuro:  no focal deficits Psych: appropriate affect   Diagnostic Studies     Assessment and Plan  1. Sinus bradycardia -long history of sinus bradycardia, he has been asymptomatic.y. - denies any recent symptoms, continue watchful waiting.      2. HTN -remains at goal, continue current meds   3. Hyperlipidemia -labs followed by pcp, he will conitnue simvastatin   F/u 1 year  Arnoldo Lenis, M.D.

## 2021-08-26 ENCOUNTER — Emergency Department (HOSPITAL_COMMUNITY): Payer: Medicare Other

## 2021-08-26 ENCOUNTER — Encounter (HOSPITAL_COMMUNITY): Payer: Self-pay | Admitting: Emergency Medicine

## 2021-08-26 ENCOUNTER — Inpatient Hospital Stay (HOSPITAL_COMMUNITY)
Admission: EM | Admit: 2021-08-26 | Discharge: 2021-08-28 | DRG: 178 | Disposition: A | Discharge status: Home Health | Payer: Medicare Other | Attending: Internal Medicine | Admitting: Internal Medicine

## 2021-08-26 ENCOUNTER — Other Ambulatory Visit: Payer: Self-pay

## 2021-08-26 DIAGNOSIS — I10 Essential (primary) hypertension: Secondary | ICD-10-CM | POA: Diagnosis present

## 2021-08-26 DIAGNOSIS — N179 Acute kidney failure, unspecified: Secondary | ICD-10-CM | POA: Diagnosis present

## 2021-08-26 DIAGNOSIS — R531 Weakness: Secondary | ICD-10-CM

## 2021-08-26 DIAGNOSIS — I959 Hypotension, unspecified: Secondary | ICD-10-CM | POA: Diagnosis present

## 2021-08-26 DIAGNOSIS — F17210 Nicotine dependence, cigarettes, uncomplicated: Secondary | ICD-10-CM | POA: Diagnosis present

## 2021-08-26 DIAGNOSIS — D631 Anemia in chronic kidney disease: Secondary | ICD-10-CM | POA: Diagnosis present

## 2021-08-26 DIAGNOSIS — Z7982 Long term (current) use of aspirin: Secondary | ICD-10-CM

## 2021-08-26 DIAGNOSIS — Z716 Tobacco abuse counseling: Secondary | ICD-10-CM

## 2021-08-26 DIAGNOSIS — Z8249 Family history of ischemic heart disease and other diseases of the circulatory system: Secondary | ICD-10-CM

## 2021-08-26 DIAGNOSIS — E785 Hyperlipidemia, unspecified: Secondary | ICD-10-CM | POA: Diagnosis present

## 2021-08-26 DIAGNOSIS — R001 Bradycardia, unspecified: Secondary | ICD-10-CM | POA: Diagnosis not present

## 2021-08-26 DIAGNOSIS — M109 Gout, unspecified: Secondary | ICD-10-CM | POA: Diagnosis present

## 2021-08-26 DIAGNOSIS — U071 COVID-19: Principal | ICD-10-CM

## 2021-08-26 DIAGNOSIS — H9190 Unspecified hearing loss, unspecified ear: Secondary | ICD-10-CM | POA: Diagnosis present

## 2021-08-26 DIAGNOSIS — Z8546 Personal history of malignant neoplasm of prostate: Secondary | ICD-10-CM

## 2021-08-26 DIAGNOSIS — I129 Hypertensive chronic kidney disease with stage 1 through stage 4 chronic kidney disease, or unspecified chronic kidney disease: Secondary | ICD-10-CM | POA: Diagnosis present

## 2021-08-26 DIAGNOSIS — Z79899 Other long term (current) drug therapy: Secondary | ICD-10-CM

## 2021-08-26 DIAGNOSIS — W1830XA Fall on same level, unspecified, initial encounter: Secondary | ICD-10-CM | POA: Diagnosis present

## 2021-08-26 DIAGNOSIS — N189 Chronic kidney disease, unspecified: Secondary | ICD-10-CM | POA: Diagnosis present

## 2021-08-26 DIAGNOSIS — E860 Dehydration: Secondary | ICD-10-CM | POA: Diagnosis not present

## 2021-08-26 DIAGNOSIS — N1832 Chronic kidney disease, stage 3b: Secondary | ICD-10-CM | POA: Diagnosis present

## 2021-08-26 DIAGNOSIS — R627 Adult failure to thrive: Secondary | ICD-10-CM | POA: Diagnosis present

## 2021-08-26 DIAGNOSIS — N289 Disorder of kidney and ureter, unspecified: Secondary | ICD-10-CM

## 2021-08-26 LAB — URINALYSIS, ROUTINE W REFLEX MICROSCOPIC
Bilirubin Urine: NEGATIVE
Glucose, UA: NEGATIVE mg/dL
Ketones, ur: NEGATIVE mg/dL
Leukocytes,Ua: NEGATIVE
Nitrite: NEGATIVE
Protein, ur: NEGATIVE mg/dL
Specific Gravity, Urine: 1.015 (ref 1.005–1.030)
pH: 7 (ref 5.0–8.0)

## 2021-08-26 LAB — BASIC METABOLIC PANEL
Anion gap: 10 (ref 5–15)
BUN: 34 mg/dL — ABNORMAL HIGH (ref 8–23)
CO2: 22 mmol/L (ref 22–32)
Calcium: 9.1 mg/dL (ref 8.9–10.3)
Chloride: 102 mmol/L (ref 98–111)
Creatinine, Ser: 2.38 mg/dL — ABNORMAL HIGH (ref 0.61–1.24)
GFR, Estimated: 27 mL/min — ABNORMAL LOW (ref >60)
Glucose, Bld: 108 mg/dL — ABNORMAL HIGH (ref 70–99)
Potassium: 3.9 mmol/L (ref 3.5–5.1)
Sodium: 134 mmol/L — ABNORMAL LOW (ref 135–145)

## 2021-08-26 LAB — CBC
HCT: 32.7 % — ABNORMAL LOW (ref 39.0–52.0)
Hemoglobin: 11 g/dL — ABNORMAL LOW (ref 13.0–17.0)
MCH: 28.6 pg (ref 26.0–34.0)
MCHC: 33.6 g/dL (ref 30.0–36.0)
MCV: 84.9 fL (ref 80.0–100.0)
Platelets: 142 10*3/uL — ABNORMAL LOW (ref 150–400)
RBC: 3.85 MIL/uL — ABNORMAL LOW (ref 4.22–5.81)
RDW: 14.1 % (ref 11.5–15.5)
WBC: 3.4 10*3/uL — ABNORMAL LOW (ref 4.0–10.5)
nRBC: 0 % (ref 0.0–0.2)

## 2021-08-26 LAB — TROPONIN I (HIGH SENSITIVITY)
Troponin I (High Sensitivity): 9 ng/L (ref <18)
Troponin I (High Sensitivity): 11 ng/L (ref <18)

## 2021-08-26 LAB — RESP PANEL BY RT-PCR (FLU A&B, COVID) ARPGX2
Influenza A by PCR: NEGATIVE
Influenza B by PCR: NEGATIVE
SARS Coronavirus 2 by RT PCR: POSITIVE — AB

## 2021-08-26 LAB — URINALYSIS, MICROSCOPIC (REFLEX)
Bacteria, UA: NONE SEEN
WBC, UA: NONE SEEN WBC/hpf (ref 0–5)

## 2021-08-26 LAB — LACTIC ACID, PLASMA: Lactic Acid, Venous: 1.7 mmol/L (ref 0.5–1.9)

## 2021-08-26 LAB — URIC ACID: Uric Acid, Serum: 2.8 mg/dL — ABNORMAL LOW (ref 3.7–8.6)

## 2021-08-26 MED ORDER — SODIUM CHLORIDE 0.9 % IV BOLUS
1000.0000 mL | Freq: Once | INTRAVENOUS | Status: AC
  Administered 2021-08-26: 14:00:00 1000 mL via INTRAVENOUS

## 2021-08-26 MED ORDER — LISINOPRIL 10 MG PO TABS: 20.0000 mg | ORAL_TABLET | Freq: Every day | ORAL | Status: DC

## 2021-08-26 MED ORDER — SODIUM CHLORIDE 0.9 % IV SOLN: INTRAVENOUS | Status: DC

## 2021-08-26 MED ORDER — VITAMIN D 25 MCG (1000 UNIT) PO TABS
1000.0000 [IU] | ORAL_TABLET | ORAL | Status: DC
  Administered 2021-08-27: 1000 [IU] via ORAL
  Filled 2021-08-26: qty 1

## 2021-08-26 MED ORDER — SODIUM CHLORIDE 0.9 % IV SOLN: 100.0000 mg | INTRAVENOUS | Status: AC

## 2021-08-26 MED ORDER — ENOXAPARIN SODIUM 30 MG/0.3ML IJ SOSY: 30.0000 mg | PREFILLED_SYRINGE | INTRAMUSCULAR | Status: DC

## 2021-08-26 MED ORDER — ACETAMINOPHEN 650 MG RE SUPP: 650.0000 mg | Freq: Four times a day (QID) | RECTAL | Status: DC | PRN

## 2021-08-26 MED ORDER — SODIUM CHLORIDE 0.45 % IV SOLN: INTRAVENOUS | Status: DC

## 2021-08-26 MED ORDER — ASPIRIN EC 81 MG PO TBEC
81.0000 mg | DELAYED_RELEASE_TABLET | Freq: Every day | ORAL | Status: DC
  Administered 2021-08-27 – 2021-08-28 (×2): 81 mg via ORAL
  Filled 2021-08-26 (×2): qty 1

## 2021-08-26 MED ORDER — AMLODIPINE BESYLATE 5 MG PO TABS
5.0000 mg | ORAL_TABLET | Freq: Every day | ORAL | Status: DC
  Administered 2021-08-27 – 2021-08-28 (×2): 5 mg via ORAL
  Filled 2021-08-26 (×2): qty 1

## 2021-08-26 MED ORDER — SENNA 8.6 MG PO TABS
1.0000 | ORAL_TABLET | Freq: Two times a day (BID) | ORAL | Status: DC
  Administered 2021-08-26 – 2021-08-28 (×3): 8.6 mg via ORAL
  Filled 2021-08-26 (×4): qty 1

## 2021-08-26 MED ORDER — TRAZODONE HCL 50 MG PO TABS: 25.0000 mg | ORAL_TABLET | Freq: Every evening | ORAL | Status: DC | PRN

## 2021-08-26 MED ORDER — ALLOPURINOL 100 MG PO TABS
200.0000 mg | ORAL_TABLET | Freq: Every day | ORAL | Status: DC
  Administered 2021-08-27 – 2021-08-28 (×2): 200 mg via ORAL
  Filled 2021-08-26 (×2): qty 2

## 2021-08-26 MED ORDER — SODIUM CHLORIDE 0.9 % IV SOLN
100.0000 mg | Freq: Every day | INTRAVENOUS | Status: DC
  Administered 2021-08-27: 100 mg via INTRAVENOUS
  Filled 2021-08-26 (×2): qty 20

## 2021-08-26 MED ORDER — SODIUM BICARBONATE 650 MG PO TABS
650.0000 mg | ORAL_TABLET | Freq: Every day | ORAL | Status: DC
  Administered 2021-08-26 – 2021-08-27 (×2): 650 mg via ORAL
  Filled 2021-08-26 (×2): qty 1

## 2021-08-26 MED ORDER — SODIUM BICARBONATE 650 MG PO TABS: 1300.0000 mg | ORAL_TABLET | Freq: Every day | ORAL | Status: DC

## 2021-08-26 MED ORDER — SIMVASTATIN 20 MG PO TABS
20.0000 mg | ORAL_TABLET | Freq: Every day | ORAL | Status: DC
  Administered 2021-08-27 – 2021-08-28 (×2): 20 mg via ORAL
  Filled 2021-08-26 (×2): qty 1

## 2021-08-26 MED ORDER — ACETAMINOPHEN 325 MG PO TABS: 650.0000 mg | ORAL_TABLET | Freq: Four times a day (QID) | ORAL | Status: DC | PRN

## 2021-08-26 NOTE — ED Notes (Addendum)
Zoll pads placed on pt , Due to sustained Heart rate of 35 -38

## 2021-08-26 NOTE — ED Provider Notes (Addendum)
[facility] EMERGENCY DEPARTMENT Provider Note   CSN: [id number] Arrival date & time: 08/26/21  1103     History Chief Complaint  Patient presents with   Weakness    Nathan [name] is a 85 y.o. male.  Patient brought in by family member.  Patient lives by himself.  Patient's been having trouble with shortness of breath upper respiratory type infection cough since Thanksgiving.  He became so weak that he is no longer able to make his own meals.  Not eating or drinking therefore.  Patient week ago had a fall and was on the floor but has no complaints secondary to that fall.  Patient known to have a long history of chronic kidney disease and bradycardia.  Family unable to provide details no pacemaker.  Cardiology aware of the rate.  And it is listed on his active problem list.  Past medical history otherwise is significant for hypertension and tobacco abuse.  And carcinoma the prostate.      Past Medical History:  Diagnosis Date   Anemia, normocytic normochromic    H&H of 12.3/35.5 in 01/2012   Auditory impairment    OS   Bradycardia    Evaluated by Dr. Cletus Gash in 2009   Carcinoma of prostate [facility]) 2004   Gout    Last symptomatic episode in 2006   Hyperlipidemia    Lipid profile in 01/2012:203, 475, 37, X   Hypertension    Systolic murmur    Consistent with mitral regurgitation   Tobacco abuse    0.5 pack per day; 30 pack years    Patient Active Problem List   Diagnosis Date Noted   Chronic kidney disease 02/23/2012   Fasting hyperglycemia 02/23/2012   Anemia, normocytic normochromic    Systolic murmur    Bradycardia    Hypertension    Carcinoma of prostate ([place])    Tobacco abuse     Past Surgical History:  Procedure Laterality Date   bone spur Right    elbow   CATARACT EXTRACTION W/PHACO Left 07/11/2017   Procedure: CATARACT EXTRACTION PHACO AND INTRAOCULAR LENS PLACEMENT LEFT EYE;  Surgeon: Baruch Goldmann, MD;  Location: [facility];  Service: Ophthalmology;   Laterality: Left;  CDE: 7.35   CATARACT EXTRACTION W/PHACO Right 07/25/2017   Procedure: CATARACT EXTRACTION PHACO AND INTRAOCULAR LENS PLACEMENT RIGHT EYE CDE=5.32;  Surgeon: Baruch Goldmann, MD;  Location: [facility] ORS;  Service: Ophthalmology;  Laterality: Right;  right   COLONOSCOPY  10/02/2012   Procedure: COLONOSCOPY;  Surgeon: Danie Binder, MD;  Location: [facility];  Service: Endoscopy;  Laterality: N/A;  10:30 AM   PROSTATE SURGERY         Family History  Problem Relation Age of Onset   Heart disease Mother    Arrhythmia Mother        brother(brady)   Coronary artery disease Neg Hx    Colon cancer Neg Hx     Social History   Tobacco Use   Smoking status: Every Day    Packs/day: 0.50    Years: 55.00    Pack years: 27.50    Types: Cigarettes    Start date: 12/29/1957   Smokeless tobacco: Never  Vaping Use   Vaping Use: Never used  Substance Use Topics   Alcohol use: Yes    Alcohol/week: 4.0 standard drinks    Types: 4 Standard drinks or equivalent per week   Drug use: No    Home Medications Prior to Admission medications  Medication Sig Start Date End Date Taking? Authorizing Provider  allopurinol (ZYLOPRIM) 300 MG tablet Take 300 mg by mouth daily.    [provider]  amLODipine (NORVASC) 5 MG tablet Take 5 mg by mouth daily. 04/07/20   [provider]  aspirin EC 81 MG tablet Take 81 mg by mouth daily.    [provider]  cephALEXin (KEFLEX) 500 MG capsule Take 1 capsule (500 mg total) by mouth 3 (three) times daily. Patient not taking: Reported on 05/02/2021 01/02/21   Kem Parkinson, PA-C  cholecalciferol (VITAMIN D) 1000 units tablet Take 1,000 Units by mouth 3 (three) times a week.     [provider]  lisinopril (PRINIVIL,ZESTRIL) 20 MG tablet Take 20 mg by mouth daily.    [provider]  nirmatrelvir/ritonavir EUA (PAXLOVID) TABS Patient GFR is 33. Take nirmatrelvir (150 mg) 1 tablet(s) twice daily for 5 days and  ritonavir (100 mg) one tablet twice daily for 5 days. 01/02/21   Triplett, Tammy, PA-C  simvastatin (ZOCOR) 20 MG tablet Take 1 tablet (20 mg total) by mouth at bedtime. 10/20/20 05/02/21  Strader, Fransisco Hertz, PA-C  sodium bicarbonate 650 MG tablet Take 650-1,300 mg by mouth 2 (two) times daily. Takes 2 tabs in the morning and 1 in the afternoon    [provider]    Allergies    Patient has no known allergies.  Review of Systems   Review of Systems  Constitutional:  Positive for activity change, appetite change and fatigue. Negative for chills and fever.  HENT:  Negative for ear pain and sore throat.   Eyes:  Negative for pain and visual disturbance.  Respiratory:  Positive for cough and shortness of breath.   Cardiovascular:  Negative for chest pain and palpitations.  Gastrointestinal:  Negative for abdominal pain and vomiting.  Genitourinary:  Negative for dysuria and hematuria.  Musculoskeletal:  Negative for arthralgias and back pain.  Skin:  Negative for color change and rash.  Neurological:  Positive for weakness. Negative for seizures, syncope, speech difficulty and numbness.  All other systems reviewed and are negative.  Physical Exam Updated Vital Signs BP (!) 132/50   Pulse (!) 44   Temp (!) 97.4 F (36.3 C) (Oral)   Resp 14   SpO2 100%   Physical Exam Vitals and nursing note reviewed.  Constitutional:      General: He is not in acute distress.    Appearance: Normal appearance. He is well-developed.  HENT:     Head: Normocephalic and atraumatic.  Eyes:     Extraocular Movements: Extraocular movements intact.     Conjunctiva/sclera: Conjunctivae normal.     Pupils: Pupils are equal, round, and reactive to light.  Cardiovascular:     Rate and Rhythm: Regular rhythm. Bradycardia present.     Heart sounds: No murmur heard. Pulmonary:     Effort: Pulmonary effort is normal. No respiratory distress.     Breath sounds: Normal breath sounds.  Abdominal:      Palpations: Abdomen is soft.     Tenderness: There is no abdominal tenderness.  Musculoskeletal:        General: No swelling.     Cervical back: Normal range of motion and neck supple.  Skin:    General: Skin is warm and dry.     Capillary Refill: Capillary refill takes less than 2 seconds.  Neurological:     General: No focal deficit present.     Mental Status: He is alert.  Cranial Nerves: No cranial nerve deficit.     Sensory: No sensory deficit.     Motor: No weakness.  Psychiatric:        Mood and Affect: Mood normal.    ED Results / Procedures / Treatments   Labs (all labs ordered are listed, but only abnormal results are displayed) Labs Reviewed  BASIC METABOLIC PANEL - Abnormal; Notable for the following components:      Result Value   Sodium 134 (*)    Glucose, Bld 108 (*)    BUN 34 (*)    Creatinine, Ser 2.38 (*)    GFR, Estimated 27 (*)    All other components within normal limits  CBC - Abnormal; Notable for the following components:   WBC 3.4 (*)    RBC 3.85 (*)    Hemoglobin 11.0 (*)    HCT 32.7 (*)    Platelets 142 (*)    All other components within normal limits  CULTURE, BLOOD (ROUTINE X 2)  RESP PANEL BY RT-PCR (FLU A&B, COVID) ARPGX2  LACTIC ACID, PLASMA  URINALYSIS, ROUTINE W REFLEX MICROSCOPIC  TROPONIN I (HIGH SENSITIVITY)  TROPONIN I (HIGH SENSITIVITY)    EKG EKG Interpretation  Date/Time:  Sunday August 26 2021 13:38:56 EST Ventricular Rate:  31 PR Interval:    QRS Duration: 126 QT Interval:  561 QTC Calculation: 403 R Axis:   56 Text Interpretation: Junctional rhythm Nonspecific intraventricular conduction delay Posterior infarct, recent Confirmed by Fredia Sorrow [phone]) on 08/26/2021 1:50:56 PM  Radiology DG Chest 2 View  Result Date: 08/26/2021 CLINICAL DATA:  Shortness of breath and weakness. Cold with cough since Thanksgiving. Generalized weakness. EXAM: CHEST - 2 VIEW COMPARISON:  Chest x-ray dated 01/02/2021. FINDINGS:  Heart size and mediastinal contours are stable. Lungs are clear. No pleural effusion or pneumothorax is seen. No acute-appearing osseous abnormality. Mild degenerative spondylosis of the kyphotic thoracic spine. IMPRESSION: No active cardiopulmonary disease. No evidence of pneumonia or pulmonary edema. Electronically Signed   By: Franki Cabot M.D.   On: 08/26/2021 13:36    Procedures Procedures   Medications Ordered in ED Medications  0.9 %  sodium chloride infusion ( Intravenous New Bag/Given 08/26/21 1511)  sodium chloride 0.9 % bolus 1,000 mL (0 mLs Intravenous Stopped 08/26/21 1504)    ED Course  I have reviewed the triage vital signs and the nursing notes.  Pertinent labs & imaging results that were available during my care of the patient were reviewed by me and considered in my medical decision making (see chart for details).    MDM Rules/Calculators/A&P                         CRITICAL CARE Performed by: Fredia Sorrow Total critical care time: 45 minutes Critical care time was exclusive of separately billable procedures and treating other patients. Critical care was necessary to treat or prevent imminent or life-threatening deterioration. Critical care was time spent personally by me on the following activities: development of treatment plan with patient and/or surrogate as well as nursing, discussions with consultants, evaluation of patient's response to treatment, examination of patient, obtaining history from patient or surrogate, ordering and performing treatments and interventions, ordering and review of laboratory studies, ordering and review of radiographic studies, pulse oximetry and re-evaluation of patient's condition.  Patient alert.  No distinct focal neurodeficits.  Extremity strength seems to all be good.  Patient upon arrival was hypotensive with a blood pressure of 86/52.  With fluids 1 L of fluid came up to 106.  Patient has bradycardia.  With heart rate 42-56.  No  fever.  Oxygen sats are in the upper 90s.  Chest x-ray negative.  We will also check troponins.  COVID and influenza testing is pending.  CBC suggestive of viral process may be with a white blood cell count of 3.4.  Hemoglobin 11.0 hematocrit 32.7.  Patient's basic metabolic panel significant for BUN of 34 creatinine of 2.38 giving him a GFR of 27.  Which is a little bit worse than his baseline.  BUN and creatinine are elevated.  Patient is known to have stage IIIb chronic kidney disease.  Patient last seen by cardiology August 10 of this year.  Patient notes there is a is a long history of sinus bradycardia.  However today's EKG looks may be more junctional but the QRS is not widened.  Will run a past cardiology.  Since the QRS is narrow.  On thinking that there this may be sinus bradycardia.  Sometimes the P waves just are not very clear-cut.  But we will see what cardiology says.  Feel that patient needs admission secondary to the hypotension.  And appearing dehydrated.  Somewhat failure to thrive because not able to make his own meals or feed appropriately.  Discussed with Dr. Katharina Caper from cardiology.  He reviewed the 3 EKGs that we had done.  And says he cannot say for sure but he does agree that the last EKG suggest sinus bradycardia but he can see P waves in all the leads.  And I agree with that.  He recommended admission did not feel there was anything acutely to be done cardiology consult tomorrow.  For reevaluation after some fluid hydration.  And then may be need to be considered for pacemaker but nothing to do acutely tonight.  Best we can tell patient is not on any medication that would slow his heart rate down.  And has had longstanding history of the sinus bradycardia.  Followed by Carlyle Dolly by cardiology here.  And last seen by him in August 2022 and they talked about the sinus bradycardia at that time.  Being longstanding and they were just can keep an eye on it.   Final Clinical  Impression(s) / ED Diagnoses Final diagnoses:  Generalized weakness  Dehydration  Hypotension, unspecified hypotension type  Renal insufficiency    Rx / DC Orders ED Discharge Orders     None        Fredia Sorrow, MD 08/26/21 1524    Fredia Sorrow, MD 08/26/21 1528    Fredia Sorrow, MD 08/26/21 1530    Fredia Sorrow, MD 08/26/21 1554

## 2021-08-26 NOTE — ED Triage Notes (Signed)
Presents from home for SOB after having a cold with cough since thanksgiving. Has become so weak that he is no longer making his own meals (lives alone) and uses a cane which is not baseline for him. H/o CKD, bradycardia (family unable to provide detail, no pacemake at this time, cardiology aware of rate).

## 2021-08-26 NOTE — ED Notes (Signed)
Patient transported to X-ray 

## 2021-08-26 NOTE — Progress Notes (Signed)
   08/26/21 2303  ECG Monitoring  CV Strip Heart Rate 33  Cardiac Rhythm Heart block  Heart Block Type 2nd degree AVB (Mobitz II) (N/N Danya Spearman)  Sonya NT notified nurse of decrease in HR and 2nd degree HB, MD Adefeso notfiied and no new orders at this time.

## 2021-08-26 NOTE — H&P (Signed)
History and Physical    Nathan [name] HMC:[id number] DOB: Feb 10, 1936 DOA: 08/26/2021  PCP: Rosita Fire, MD   Patient coming from: home  I have personally briefly reviewed patient's old medical records in [place]  Chief Complaint: weakness, SOB/DOE, poor PO intake  HPI: Nathan [name] is a 85 y.o. male with medical history significant of bradycardia, CKD III, gout, HLD. Family reports he has been weak with cough, SOB, DOE since Thanksgiving. He has had poor PO intake. He did have a fall but was uninjured. Due to progressive weakness he was brought to [facility] ED for evaluation.   ED Course: T 97.4  86/52 - IVF- 110/54  HR 31-42  RR 14 nl O2 sat. EKG #1 junctional rhythm with rate of 31, #2 A fib with rate of 48. Cmet with BUN 34/Cr 2.38 ( May '22 30/1.98, 2018 26/1.72). CXKR clear. Lactic acid 1.7. Cardiology consult- Dr. Cathie Olden- reviewed EKGs: recommend tele admit with cardiology consult 08/27/21.  Patient Covid POSITIVE. [facility] called to admit patient.   Review of Systems: As per HPI otherwise 10 point review of systems negative.    Past Medical History:  Diagnosis Date   Anemia, normocytic normochromic    H&H of 12.3/35.5 in 01/2012   Auditory impairment    OS   Bradycardia    Evaluated by Dr. Cletus Gash in 2009   Carcinoma of prostate [facility]) 2004   Gout    Last symptomatic episode in 2006   Hyperlipidemia    Lipid profile in 01/2012:203, 475, 37, X   Hypertension    Systolic murmur    Consistent with mitral regurgitation   Tobacco abuse    0.5 pack per day; 30 pack years    Past Surgical History:  Procedure Laterality Date   bone spur Right    elbow   CATARACT EXTRACTION W/PHACO Left 07/11/2017   Procedure: CATARACT EXTRACTION PHACO AND INTRAOCULAR LENS PLACEMENT LEFT EYE;  Surgeon: Baruch Goldmann, MD;  Location: [facility];  Service: Ophthalmology;  Laterality: Left;  CDE: 7.35   CATARACT EXTRACTION W/PHACO Right 07/25/2017   Procedure: CATARACT EXTRACTION PHACO AND  INTRAOCULAR LENS PLACEMENT RIGHT EYE CDE=5.32;  Surgeon: Baruch Goldmann, MD;  Location: [facility];  Service: Ophthalmology;  Laterality: Right;  right   COLONOSCOPY  10/02/2012   Procedure: COLONOSCOPY;  Surgeon: Danie Binder, MD;  Location: [facility] SUITE;  Service: Endoscopy;  Laterality: N/A;  10:30 AM   PROSTATE SURGERY      Soc Hx - married 85 yrs, widowed in 2017. He has 2 sons, 2 daughters, 7 grands, 6 great-grands. 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 Lives alone but has an attentive family nearby.   reports that he has been smoking cigarettes. He started smoking about 63 years ago. He has a 27.50 pack-year smoking history. He has never used smokeless tobacco. He reports current alcohol use of about 4.0 standard drinks per week. He reports that he does not use drugs.  No Known Allergies  Family History  Problem Relation Age of Onset   Heart disease Mother    Arrhythmia Mother        brother(brady)   Coronary artery disease Neg Hx    Colon cancer Neg Hx      Prior to Admission medications   Medication Sig Start Date End Date Taking? Authorizing Provider  allopurinol (ZYLOPRIM) 100 MG tablet Take 200 mg by mouth daily. 08/13/21  Yes [provider]  amLODipine (NORVASC) 5 MG  tablet Take 5 mg by mouth daily. 04/07/20  Yes [provider]  aspirin EC 81 MG tablet Take 81 mg by mouth daily.   Yes [provider]  cholecalciferol (VITAMIN D) 1000 units tablet Take 1,000 Units by mouth 3 (three) times a week.    Yes [provider]  lisinopril (PRINIVIL,ZESTRIL) 20 MG tablet Take 20 mg by mouth daily.   Yes [provider]  simvastatin (ZOCOR) 20 MG tablet Take 1 tablet (20 mg total) by mouth at bedtime. Patient taking differently: Take 20 mg by mouth daily. 10/20/20 08/26/21 Yes Strader, Fransisco Hertz, PA-C  sodium bicarbonate 650 MG tablet Take 650-1,300 mg by mouth 2 (two) times daily. Takes 2 tabs in the morning and 1 in the  evening   Yes [provider]  allopurinol (ZYLOPRIM) 300 MG tablet Take 300 mg by mouth daily. Patient not taking: Reported on 08/26/2021    [provider]  cephALEXin (KEFLEX) 500 MG capsule Take 1 capsule (500 mg total) by mouth 3 (three) times daily. Patient not taking: Reported on 05/02/2021 01/02/21   Kem Parkinson, PA-C  nirmatrelvir/ritonavir EUA (PAXLOVID) TABS Patient GFR is 33. Take nirmatrelvir (150 mg) 1 tablet(s) twice daily for 5 days and ritonavir (100 mg) one tablet twice daily for 5 days. Patient not taking: Reported on 08/26/2021 01/02/21   Kem Parkinson, PA-C    Physical Exam: Vitals:   08/26/21 1330 08/26/21 1400 08/26/21 1530 08/26/21 1756  BP: 107/60 (!) 132/50 (!) 110/54   Pulse: (!) 54 (!) 44 (!) 42   Resp: 19 14 14    Temp:      TempSrc:      SpO2: 100% 100% 100%   Weight:    68 kg     Vitals:   08/26/21 1330 08/26/21 1400 08/26/21 1530 08/26/21 1756  BP: 107/60 (!) 132/50 (!) 110/54   Pulse: (!) 54 (!) 44 (!) 42   Resp: 19 14 14    Temp:      TempSrc:      SpO2: 100% 100% 100%   Weight:    68 kg   General: WNWD man in no distress. Eyes: PERRL, lids and conjunctivae normal ENMT: Mucous membranes are dry Posterior pharynx clear of any exudate or lesions. Upper denture, lower partial, no oral lesion. Neck: normal, supple, no masses, no thyromegaly Respiratory: clear to auscultation bilaterally, no wheezing, no crackles. Normal respiratory effort. No accessory muscle use.  Cardiovascular: Regular bradycardia no murmurs / rubs / gallops. No extremity edema. 2+ pedal pulses. No carotid bruits.  Abdomen: no tenderness, no masses palpated. No hepatosplenomegaly. Bowel sounds positive.  Musculoskeletal: no clubbing / cyanosis. No joint deformity upper and lower extremities. Good ROM, no contractures. Normal muscle tone.  Skin: no rashes, lesions, ulcers. No induration Neurologic: CN 2-12 grossly intact. Strength 4/5 in all 4- MAE to command,  able to sit up with minimal assistance.  Psychiatric: Normal judgment and insight. Alert and oriented x 3. Normal mood.     Labs on Admission: I have personally reviewed following labs and imaging studies  CBC: Recent Labs  Lab 08/26/21 1233  WBC 3.4*  HGB 11.0*  HCT 32.7*  MCV 84.9  PLT 161*   Basic Metabolic Panel: Recent Labs  Lab 08/26/21 1233  NA 134*  K 3.9  CL 102  CO2 22  GLUCOSE 108*  BUN 34*  CREATININE 2.38*  CALCIUM 9.1   GFR: Estimated Creatinine Clearance: 23 mL/min (A) (by C-G formula based on  SCr of 2.38 mg/dL (H)). Liver Function Tests: No results for input(s): AST, ALT, ALKPHOS, BILITOT, PROT, ALBUMIN in the last 168 hours. No results for input(s): LIPASE, AMYLASE in the last 168 hours. No results for input(s): AMMONIA in the last 168 hours. Coagulation Profile: No results for input(s): INR, PROTIME in the last 168 hours. Cardiac Enzymes: No results for input(s): CKTOTAL, CKMB, CKMBINDEX, TROPONINI in the last 168 hours. BNP (last 3 results) No results for input(s): PROBNP in the last 8760 hours. HbA1C: No results for input(s): HGBA1C in the last 72 hours. CBG: No results for input(s): GLUCAP in the last 168 hours. Lipid Profile: No results for input(s): CHOL, HDL, LDLCALC, TRIG, CHOLHDL, LDLDIRECT in the last 72 hours. Thyroid Function Tests: No results for input(s): TSH, T4TOTAL, FREET4, T3FREE, THYROIDAB in the last 72 hours. Anemia Panel: No results for input(s): VITAMINB12, FOLATE, FERRITIN, TIBC, IRON, RETICCTPCT in the last 72 hours. Urine analysis:    Component Value Date/Time   COLORURINE YELLOW 08/26/2021 1505   APPEARANCEUR CLEAR 08/26/2021 1505   LABSPEC 1.015 08/26/2021 1505   PHURINE 7.0 08/26/2021 1505   GLUCOSEU NEGATIVE 08/26/2021 1505   HGBUR TRACE (A) 08/26/2021 1505   BILIRUBINUR NEGATIVE 08/26/2021 1505   KETONESUR NEGATIVE 08/26/2021 1505   PROTEINUR NEGATIVE 08/26/2021 1505   NITRITE NEGATIVE 08/26/2021 [place] 08/26/2021 1505    Radiological Exams on Admission: DG Chest 2 View  Result Date: 08/26/2021 CLINICAL DATA:  Shortness of breath and weakness. Cold with cough since Thanksgiving. Generalized weakness. EXAM: CHEST - 2 VIEW COMPARISON:  Chest x-ray dated 01/02/2021. FINDINGS: Heart size and mediastinal contours are stable. Lungs are clear. No pleural effusion or pneumothorax is seen. No acute-appearing osseous abnormality. Mild degenerative spondylosis of the kyphotic thoracic spine. IMPRESSION: No active cardiopulmonary disease. No evidence of pneumonia or pulmonary edema. Electronically Signed   By: Franki Cabot M.D.   On: 08/26/2021 13:36   CT Head Wo Contrast  Result Date: 08/26/2021 CLINICAL DATA:  Neuro deficit, acute, stroke suspected EXAM: CT HEAD WITHOUT CONTRAST TECHNIQUE: Contiguous axial images were obtained from the base of the skull through the vertex without intravenous contrast. COMPARISON:  None. BRAIN: BRAIN Cerebral ventricle sizes are concordant with the degree of cerebral volume loss. Right calvarial convexity hygroma. No evidence of large-territorial acute infarction. No parenchymal hemorrhage. No mass lesion. No extra-axial collection. No mass effect or midline shift. No hydrocephalus. Basilar cisterns are patent. Enlarged pituitary gland measuring 2.7 x 1.5 x 1.9cm. Vascular: No hyperdense vessel. Skull: No acute fracture or focal lesion. Sinuses/Orbits: Mucosal thickening of bilateral sphenoid, maxillary, ethmoid sinuses. Otherwise paranasal sinuses and mastoid air cells are clear. The orbits are unremarkable. Other: None. IMPRESSION: 1. Enlarged pituitary gland measuring 2.7 x 1.5 cm. Recommend MRI sella protocol for further evaluation. 2. Mucosal thickening of bilateral sphenoid, maxillary, ethmoid sinuses. Electronically Signed   By: Iven Finn M.D.   On: 08/26/2021 15:34    EKG: Independently reviewed. #1 junctional rhythm at 31, #2 a fib at 48. No  acute injury.  Assessment/Plan Principal Problem:   Bradycardia with 41-50 beats per minute Active Problems:   Bradycardia   COVID-19 virus infection   Hypertension   Chronic kidney disease  Covid 19 infection - patient's symptoms most likely due to covid infection. He has had two vaccinations and two boosters. He took paxolovid incorrectly in April '22. Now positive with weakness, loss of appetite with subsequent dehydration and weight loss over the past week.  Plan Remdesivir IV - pharmacy agrees  Supportive care  2. Cardiology - patient with long standing asymptomatic bradycardia. Troponins negative x 2. Heart rate at baseline. As of Jan '22 not a candidate for Pacemaker. Follows with Dr. Lovena Le for EP. Symptoms most likely related to #1. Plan Tele observation  Cardiology to follow up in AM  Continue home meds except will hold ACE-I  2 D echo - transient a fib  3. CKD 3b - baseline Cr 1.8, now 2.8. Followed by nephrology.  Suspect prerenal cause due to #1 Plan Hold ACE-I  IVF - 100cc/hr  F/u Bmet  4. HTN - initially hypotensive due to dehydration. Currently stable. Plan Continue home meds except for ACE-I  5. Gout - last uric acid as outpatient 2. Plan Continue home meds  Uric acid pending.     DVT prophylaxis: lovenox  Code Status: full code  Family Communication: Daughter at bedside during interveiw and exam. Understands Dx and Tx plan and agrees. Vernona Rieger is a Presenter, broadcasting and she also agrees.   Disposition Plan: home when medically stable  Consults called: Cardiology - Dr. Cathie Olden  Admission status: obs-tele    Adella Hare MD [facility] Pager [phone]  If 7PM-7AM, please contact night-coverage [email or web address] Password [email or web address]  08/26/2021, 6:00 PM

## 2021-08-27 ENCOUNTER — Inpatient Hospital Stay (HOSPITAL_COMMUNITY): Payer: Medicare Other

## 2021-08-27 DIAGNOSIS — N1832 Chronic kidney disease, stage 3b: Secondary | ICD-10-CM | POA: Diagnosis present

## 2021-08-27 DIAGNOSIS — R627 Adult failure to thrive: Secondary | ICD-10-CM | POA: Diagnosis present

## 2021-08-27 DIAGNOSIS — F17210 Nicotine dependence, cigarettes, uncomplicated: Secondary | ICD-10-CM | POA: Diagnosis present

## 2021-08-27 DIAGNOSIS — R001 Bradycardia, unspecified: Secondary | ICD-10-CM

## 2021-08-27 DIAGNOSIS — E785 Hyperlipidemia, unspecified: Secondary | ICD-10-CM | POA: Diagnosis present

## 2021-08-27 DIAGNOSIS — Z716 Tobacco abuse counseling: Secondary | ICD-10-CM | POA: Diagnosis not present

## 2021-08-27 DIAGNOSIS — U071 COVID-19: Principal | ICD-10-CM

## 2021-08-27 DIAGNOSIS — D631 Anemia in chronic kidney disease: Secondary | ICD-10-CM | POA: Diagnosis present

## 2021-08-27 DIAGNOSIS — N179 Acute kidney failure, unspecified: Secondary | ICD-10-CM | POA: Diagnosis present

## 2021-08-27 DIAGNOSIS — I959 Hypotension, unspecified: Secondary | ICD-10-CM | POA: Diagnosis present

## 2021-08-27 DIAGNOSIS — Z79899 Other long term (current) drug therapy: Secondary | ICD-10-CM | POA: Diagnosis not present

## 2021-08-27 DIAGNOSIS — Z7982 Long term (current) use of aspirin: Secondary | ICD-10-CM | POA: Diagnosis not present

## 2021-08-27 DIAGNOSIS — I129 Hypertensive chronic kidney disease with stage 1 through stage 4 chronic kidney disease, or unspecified chronic kidney disease: Secondary | ICD-10-CM | POA: Diagnosis present

## 2021-08-27 DIAGNOSIS — Z8546 Personal history of malignant neoplasm of prostate: Secondary | ICD-10-CM | POA: Diagnosis not present

## 2021-08-27 DIAGNOSIS — W1830XA Fall on same level, unspecified, initial encounter: Secondary | ICD-10-CM | POA: Diagnosis present

## 2021-08-27 DIAGNOSIS — I4891 Unspecified atrial fibrillation: Secondary | ICD-10-CM

## 2021-08-27 DIAGNOSIS — Z8249 Family history of ischemic heart disease and other diseases of the circulatory system: Secondary | ICD-10-CM | POA: Diagnosis not present

## 2021-08-27 DIAGNOSIS — M109 Gout, unspecified: Secondary | ICD-10-CM | POA: Diagnosis present

## 2021-08-27 DIAGNOSIS — H9190 Unspecified hearing loss, unspecified ear: Secondary | ICD-10-CM | POA: Diagnosis present

## 2021-08-27 DIAGNOSIS — E860 Dehydration: Secondary | ICD-10-CM | POA: Diagnosis present

## 2021-08-27 LAB — ECHOCARDIOGRAM COMPLETE
AV Mean grad: 3 mmHg
AV Peak grad: 6.3 mmHg
Ao pk vel: 1.25 m/s
Area-P 1/2: 1.79 cm2
Calc EF: 55.2 %
Height: 69 in
S' Lateral: 3.8 cm
Single Plane A2C EF: 49 %
Single Plane A4C EF: 59.7 %
Weight: 2400.02 oz

## 2021-08-27 MED ORDER — SODIUM CHLORIDE 0.9 % IV SOLN
100.0000 mg | Freq: Once | INTRAVENOUS | Status: AC
  Administered 2021-08-27: 100 mg via INTRAVENOUS

## 2021-08-27 MED ORDER — ASCORBIC ACID 500 MG PO TABS
500.0000 mg | ORAL_TABLET | Freq: Every day | ORAL | Status: DC
  Administered 2021-08-27 – 2021-08-28 (×2): 500 mg via ORAL
  Filled 2021-08-27 (×2): qty 1

## 2021-08-27 MED ORDER — ZINC SULFATE 220 (50 ZN) MG PO CAPS
220.0000 mg | ORAL_CAPSULE | Freq: Every day | ORAL | Status: DC
  Administered 2021-08-27 – 2021-08-28 (×2): 220 mg via ORAL
  Filled 2021-08-27 (×2): qty 1

## 2021-08-27 MED ORDER — GUAIFENESIN-DM 100-10 MG/5ML PO SYRP: 10.0000 mL | ORAL_SOLUTION | ORAL | Status: DC | PRN

## 2021-08-27 MED ORDER — HYDROCOD POLST-CPM POLST ER 10-8 MG/5ML PO SUER: 5.0000 mL | Freq: Two times a day (BID) | ORAL | Status: DC | PRN

## 2021-08-27 MED ORDER — SODIUM CHLORIDE 0.9 % IV SOLN: INTRAVENOUS | Status: DC

## 2021-08-27 NOTE — Progress Notes (Signed)
PROGRESS NOTE    Nathan [name]  MPN:[id number] DOB: 1935-10-04 DOA: 08/26/2021 PCP: Rosita Fire, MD   Brief Narrative:   Per HPI: Nathan [name] is a 85 y.o. male with medical history significant of bradycardia, CKD III, gout, HLD. Family reports he has been weak with cough, SOB, DOE since Thanksgiving. He has had poor PO intake. He did have a fall but was uninjured. Due to progressive weakness he was brought to [facility] ED for evaluation.  He was admitted for COVID-19 infection as well as AKI on CKD in the setting of longstanding bradycardia.  Cardiology recommending outpatient EP evaluation.  Assessment & Plan:   Principal Problem:   Bradycardia with 41-50 beats per minute Active Problems:   Bradycardia   Hypertension   Chronic kidney disease   COVID-19 virus infection   Sinus bradycardia-longstanding -Plan for outpatient EP follow-up for PPM placement -Follow-up TTE -Appreciate cardiology evaluation  Symptomatic COVID-19 infection -Started on remdesivir -Monitor inflammatory markers -Isolation precautions  Prerenal AKI on CKD stage IIIb secondary to above -Continue IV fluid hydration -Avoid nephrotoxic agents -Monitor repeat labs -Continue sodium bicarbonate -Strict I's and O's  Hypertension-controlled -Currently on amlodipine -Holding ACE inhibitor due to AKI  Dyslipidemia -Zocor  Gout -Continue allopurinol -No acute flare noted  Falls at home -PT evaluation  Tobacco abuse -Smokes half pack per day -Counseled on cessation   DVT prophylaxis: Lovenox Code Status: Full Family Communication: Granddaughter at bedside Disposition Plan:  Status is: Observation  The patient will require care spanning > 2 midnights and should be moved to inpatient because: IV fluids.  Consultants:  Cardiology  Procedures:  See below  Antimicrobials:  Anti-infectives (From admission, onward)    Start     Dose/Rate Route Frequency Ordered Stop   08/27/21 1130   remdesivir 100 mg in sodium chloride 0.9 % 100 mL IVPB        100 mg 200 mL/hr over 30 Minutes Intravenous Once 08/27/21 1043 08/27/21 1157   08/27/21 1000  remdesivir 100 mg in sodium chloride 0.9 % 100 mL IVPB       See Hyperspace for full Linked Orders Report.   100 mg 200 mL/hr over 30 Minutes Intravenous Daily 08/26/21 1719 09/01/21 0959   08/26/21 1730  remdesivir 100 mg in sodium chloride 0.9 % 100 mL IVPB       See Hyperspace for full Linked Orders Report.   100 mg 200 mL/hr over 30 Minutes Intravenous Every 30 min 08/26/21 1719 08/26/21 1829       Subjective: Patient seen and evaluated today with no new acute complaints or concerns. No acute concerns or events noted overnight.  He continues to remain bradycardic.  He has ongoing poor appetite.  Objective: Vitals:   08/26/21 1756 08/26/21 1844 08/26/21 2041 08/27/21 0853  BP:  (!) 111/56 (!) 126/57 (!) 129/53  Pulse:  (!) 44 (!) 52   Resp:  16 17   Temp:   98.2 F (36.8 C)   TempSrc:   Oral   SpO2:  100% 100%   Weight: 68 kg 68 kg    Height:  5\' 9"  (1.753 m)      Intake/Output Summary (Last 24 hours) at 08/27/2021 1213 Last data filed at 08/27/2021 0300 Gross per 24 hour  Intake 2101.11 ml  Output --  Net 2101.11 ml   Filed Weights   08/26/21 1756 08/26/21 1844  Weight: 68 kg 68 kg    Examination:  General exam: Appears calm and  comfortable  Respiratory system: Clear to auscultation. Respiratory effort normal. Cardiovascular system: S1 & S2 heard, RRR.  Bradycardic. Gastrointestinal system: Abdomen is soft Central nervous system: Alert and awake Extremities: No edema Skin: No significant lesions noted Psychiatry: Flat affect.    Data Reviewed: I have personally reviewed following labs and imaging studies  CBC: Recent Labs  Lab 08/26/21 1233  WBC 3.4*  HGB 11.0*  HCT 32.7*  MCV 84.9  PLT 324*   Basic Metabolic Panel: Recent Labs  Lab 08/26/21 1233  NA 134*  K 3.9  CL 102  CO2 22   GLUCOSE 108*  BUN 34*  CREATININE 2.38*  CALCIUM 9.1   GFR: Estimated Creatinine Clearance: 23 mL/min (A) (by C-G formula based on SCr of 2.38 mg/dL (H)). Liver Function Tests: No results for input(s): AST, ALT, ALKPHOS, BILITOT, PROT, ALBUMIN in the last 168 hours. No results for input(s): LIPASE, AMYLASE in the last 168 hours. No results for input(s): AMMONIA in the last 168 hours. Coagulation Profile: No results for input(s): INR, PROTIME in the last 168 hours. Cardiac Enzymes: No results for input(s): CKTOTAL, CKMB, CKMBINDEX, TROPONINI in the last 168 hours. BNP (last 3 results) No results for input(s): PROBNP in the last 8760 hours. HbA1C: No results for input(s): HGBA1C in the last 72 hours. CBG: No results for input(s): GLUCAP in the last 168 hours. Lipid Profile: No results for input(s): CHOL, HDL, LDLCALC, TRIG, CHOLHDL, LDLDIRECT in the last 72 hours. Thyroid Function Tests: No results for input(s): TSH, T4TOTAL, FREET4, T3FREE, THYROIDAB in the last 72 hours. Anemia Panel: No results for input(s): VITAMINB12, FOLATE, FERRITIN, TIBC, IRON, RETICCTPCT in the last 72 hours. Sepsis Labs: Recent Labs  Lab 08/26/21 1428  LATICACIDVEN 1.7    Recent Results (from the past 240 hour(s))  Resp Panel by RT-PCR (Flu A&B, Covid) Nasopharyngeal Swab     Status: Abnormal   Collection Time: 08/26/21  2:07 PM   Specimen: Nasopharyngeal Swab; Nasopharyngeal(NP) swabs in vial transport medium  Result Value Ref Range Status   SARS Coronavirus 2 by RT PCR POSITIVE (A) NEGATIVE Final    Comment: CRITICAL RESULT CALLED TO, READ BACK BY AND VERIFIED WITH:  C GROSE @ 4010 08/26/21 BY STEPHTR (NOTE) SARS-CoV-2 target nucleic acids are DETECTED.  The SARS-CoV-2 RNA is generally detectable in upper respiratory specimens during the acute phase of infection. Positive results are indicative of the presence of the identified virus, but do not rule out bacterial infection or co-infection  with other pathogens not detected by the test. Clinical correlation with patient history and other diagnostic information is necessary to determine patient infection status. The expected result is Negative.  Fact Sheet for Patients: EntrepreneurPulse.com.au  Fact Sheet for Healthcare Providers: IncredibleEmployment.be  This test is not yet approved or cleared by the [place] FDA and  has been authorized for detection and/or diagnosis of SARS-CoV-2 by FDA under an Emergency Use Authorization (EUA).  This EUA will remain in effect (meaning this t est can be used) for the duration of  the COVID-19 declaration under Section 564(b)(1) of the Act, 21 U.S.C. section 360bbb-3(b)(1), unless the authorization is terminated or revoked sooner.     Influenza A by PCR NEGATIVE NEGATIVE Final   Influenza B by PCR NEGATIVE NEGATIVE Final    Comment: (NOTE) The Xpert Xpress SARS-CoV-2/FLU/RSV plus assay is intended as an aid in the diagnosis of influenza from Nasopharyngeal swab specimens and should not be used as a sole basis for treatment. Nasal  washings and aspirates are unacceptable for Xpert Xpress SARS-CoV-2/FLU/RSV testing.  Fact Sheet for Patients: EntrepreneurPulse.com.au  Fact Sheet for Healthcare Providers: IncredibleEmployment.be  This test is not yet approved or cleared by the [place] FDA and has been authorized for detection and/or diagnosis of SARS-CoV-2 by FDA under an Emergency Use Authorization (EUA). This EUA will remain in effect (meaning this test can be used) for the duration of the COVID-19 declaration under Section 564(b)(1) of the Act, 21 U.S.C. section 360bbb-3(b)(1), unless the authorization is terminated or revoked.  Performed at [facility], [address] [place], [place] [address]   Culture, blood (Routine X 2) w Reflex to ID Panel     Status: None (Preliminary result)    Collection Time: 08/26/21  2:27 PM   Specimen: BLOOD LEFT ARM  Result Value Ref Range Status   Specimen Description BLOOD LEFT ARM BOTTLES DRAWN AEROBIC AND ANAEROBIC  Final   Special Requests Blood Culture adequate volume  Final   Culture   Final    NO GROWTH < 24 HOURS Performed at [facility], [address], [place], [place] [address]    Report Status PENDING  Incomplete         Radiology Studies: DG Chest 2 View  Result Date: 08/26/2021 CLINICAL DATA:  Shortness of breath and weakness. Cold with cough since Thanksgiving. Generalized weakness. EXAM: CHEST - 2 VIEW COMPARISON:  Chest x-ray dated 01/02/2021. FINDINGS: Heart size and mediastinal contours are stable. Lungs are clear. No pleural effusion or pneumothorax is seen. No acute-appearing osseous abnormality. Mild degenerative spondylosis of the kyphotic thoracic spine. IMPRESSION: No active cardiopulmonary disease. No evidence of pneumonia or pulmonary edema. Electronically Signed   By: Franki Cabot M.D.   On: 08/26/2021 13:36   CT Head Wo Contrast  Result Date: 08/26/2021 CLINICAL DATA:  Neuro deficit, acute, stroke suspected EXAM: CT HEAD WITHOUT CONTRAST TECHNIQUE: Contiguous axial images were obtained from the base of the skull through the vertex without intravenous contrast. COMPARISON:  None. BRAIN: BRAIN Cerebral ventricle sizes are concordant with the degree of cerebral volume loss. Right calvarial convexity hygroma. No evidence of large-territorial acute infarction. No parenchymal hemorrhage. No mass lesion. No extra-axial collection. No mass effect or midline shift. No hydrocephalus. Basilar cisterns are patent. Enlarged pituitary gland measuring 2.7 x 1.5 x 1.9cm. Vascular: No hyperdense vessel. Skull: No acute fracture or focal lesion. Sinuses/Orbits: Mucosal thickening of bilateral sphenoid, maxillary, ethmoid sinuses. Otherwise paranasal sinuses and mastoid air cells are clear. The orbits are unremarkable. Other: None.  IMPRESSION: 1. Enlarged pituitary gland measuring 2.7 x 1.5 cm. Recommend MRI sella protocol for further evaluation. 2. Mucosal thickening of bilateral sphenoid, maxillary, ethmoid sinuses. Electronically Signed   By: Iven Finn M.D.   On: 08/26/2021 15:34        Scheduled Meds:  allopurinol  200 mg Oral Daily   amLODipine  5 mg Oral Daily   vitamin C  500 mg Oral Daily   aspirin EC  81 mg Oral Daily   cholecalciferol  1,000 Units Oral Once per day on Mon Wed Fri   senna  1 tablet Oral BID   simvastatin  20 mg Oral Daily   sodium bicarbonate  650 mg Oral Q2000   zinc sulfate  220 mg Oral Daily   Continuous Infusions:  sodium chloride 75 mL/hr at 08/27/21 0853   remdesivir 100 mg in NS 100 mL 100 mg (08/27/21 1017)     LOS: 1 day  Time spent: 35 minutes    Jeyson Deshotel Darleen Crocker, DO [facility]  If 7PM-7AM, please contact night-coverage [email or web address] 08/27/2021, 12:13 PM

## 2021-08-27 NOTE — Progress Notes (Signed)
   08/27/21 0336  ECG Monitoring  Ectopy Sinus pause (2.50sec Pause  N/N [place] Ar)  Sonya NT with telemetry notified this nurse of 2.50 sec pause. Pt sustained HR of 35-40 bpm on monitor. MD Adefeso aware.

## 2021-08-27 NOTE — Progress Notes (Signed)
*  PRELIMINARY RESULTS* Echocardiogram 2D Echocardiogram has been performed.  Elpidio Anis 08/27/2021, 4:20 PM

## 2021-08-27 NOTE — Consult Note (Addendum)
Cardiology Consultation:   Patient ID: Nathan [name] MRN: [id number]; DOB: Nov 26, 1935  Admit date: 08/26/2021 Date of Consult: 08/27/2021  PCP:  Rosita Fire, [place] Prov[facility] Cardiologist:  Carlyle Dolly, MD        Patient Profile:   Nathan [name] is a 85 y.o. male with a hx of HTN, sinus bradycardia who is being seen 08/27/2021 for the evaluation of sinus brady at the request of Dr. Manuella Ghazi.  History of Present Illness:   Nathan [name] with long history of sinus bradycardia in 40's has been asymptomatic and seen by Dr. Lovena Le 2018. Patient with URI and short of breath since Thanksgiving, not eating or drinking and had a fall. Covid 19 positive, Crt 2.38. EKG junctional rhythm 31/m. Dr Acie Fredrickson reviewed EKG's and felt he had P waves on all EKG's.  When going to the [place] last Sat he just gave out and fell to the floor and laid there for awhile. Didn't pass out. Was doing well prior to [place], lives alone and does some cooking. Very HOH, granddaughter at bedside.   Past Medical History:  Diagnosis Date   Anemia, normocytic normochromic    H&H of 12.3/35.5 in 01/2012   Auditory impairment    OS   Bradycardia    Evaluated by Dr. Cletus Gash in 2009   Carcinoma of prostate [facility]) 2004   Gout    Last symptomatic episode in 2006   Hyperlipidemia    Lipid profile in 01/2012:203, 475, 37, X   Hypertension    Systolic murmur    Consistent with mitral regurgitation   Tobacco abuse    0.5 pack per day; 30 pack years    Past Surgical History:  Procedure Laterality Date   bone spur Right    elbow   CATARACT EXTRACTION W/PHACO Left 07/11/2017   Procedure: CATARACT EXTRACTION PHACO AND INTRAOCULAR LENS PLACEMENT LEFT EYE;  Surgeon: Baruch Goldmann, MD;  Location: [facility];  Service: Ophthalmology;  Laterality: Left;  CDE: 7.35   CATARACT EXTRACTION W/PHACO Right 07/25/2017   Procedure: CATARACT EXTRACTION PHACO AND INTRAOCULAR LENS PLACEMENT RIGHT EYE  CDE=5.32;  Surgeon: Baruch Goldmann, MD;  Location: [facility];  Service: Ophthalmology;  Laterality: Right;  right   COLONOSCOPY  10/02/2012   Procedure: COLONOSCOPY;  Surgeon: Danie Binder, MD;  Location: [facility];  Service: Endoscopy;  Laterality: N/A;  10:30 AM   PROSTATE SURGERY       Home Medications:  Prior to Admission medications   Medication Sig Start Date End Date Taking? Authorizing Provider  allopurinol (ZYLOPRIM) 100 MG tablet Take 200 mg by mouth daily. 08/13/21  Yes [provider]  amLODipine (NORVASC) 5 MG tablet Take 5 mg by mouth daily. 04/07/20  Yes [provider]  aspirin EC 81 MG tablet Take 81 mg by mouth daily.   Yes [provider]  cholecalciferol (VITAMIN D) 1000 units tablet Take 1,000 Units by mouth 3 (three) times a week.    Yes [provider]  lisinopril (PRINIVIL,ZESTRIL) 20 MG tablet Take 20 mg by mouth daily.   Yes [provider]  simvastatin (ZOCOR) 20 MG tablet Take 1 tablet (20 mg total) by mouth at bedtime. Patient taking differently: Take 20 mg by mouth daily. 10/20/20 08/26/21 Yes Strader, Fransisco Hertz, PA-C  sodium bicarbonate 650 MG tablet Take 650-1,300 mg by mouth 2 (two) times daily. Takes 2 tabs in the morning and 1 in the evening   Yes [provider]  allopurinol (ZYLOPRIM) 300 MG tablet Take 300 mg by mouth daily. Patient not taking: Reported on 08/26/2021    [provider]  cephALEXin (KEFLEX) 500 MG capsule Take 1 capsule (500 mg total) by mouth 3 (three) times daily. Patient not taking: Reported on 05/02/2021 01/02/21   Kem Parkinson, PA-C  nirmatrelvir/ritonavir EUA (PAXLOVID) TABS Patient GFR is 33. Take nirmatrelvir (150 mg) 1 tablet(s) twice daily for 5 days and ritonavir (100 mg) one tablet twice daily for 5 days. Patient not taking: Reported on 08/26/2021 01/02/21   Kem Parkinson, PA-C    Inpatient Medications: Scheduled Meds:  allopurinol  200 mg Oral Daily    amLODipine  5 mg Oral Daily   aspirin EC  81 mg Oral Daily   cholecalciferol  1,000 Units Oral Once per day on Mon Wed Fri   senna  1 tablet Oral BID   simvastatin  20 mg Oral Daily   sodium bicarbonate  650 mg Oral Q2000   Continuous Infusions:  sodium chloride 75 mL/hr at 08/27/21 0932   remdesivir 100 mg in NS 100 mL     PRN Meds: acetaminophen **OR** acetaminophen, traZODone  Allergies:   No Known Allergies  Social History:   Social History   Socioeconomic History   Marital status: Widowed    Spouse name: Not on file   Number of children: Not on file   Years of education: Not on file   Highest education level: Not on file  Occupational History   Occupation: Retired    Fish farm manager: [employer]  Tobacco Use   Smoking status: Every Day    Packs/day: 0.50    Years: 55.00    Pack years: 27.50    Types: Cigarettes    Start date: 12/29/1957   Smokeless tobacco: Never  Vaping Use   Vaping Use: Never used  Substance and Sexual Activity   Alcohol use: Yes    Alcohol/week: 4.0 standard drinks    Types: 4 Standard drinks or equivalent per week   Drug use: No   Sexual activity: Not Currently    Birth control/protection: None  Other Topics Concern   Not on file  Social History Narrative   Not on file   Social Determinants of Health   Financial Resource Strain: Not on file  Food Insecurity: Not on file  Transportation Needs: Not on file  Physical Activity: Not on file  Stress: Not on file  Social Connections: Not on file  Intimate Partner Violence: Not on file    Family History:     Family History  Problem Relation Age of Onset   Heart disease Mother    Arrhythmia Mother        brother(brady)   Coronary artery disease Neg Hx    Colon cancer Neg Hx      ROS:  Please see the history of present illness.  Review of Systems  Constitutional: Positive for decreased appetite and malaise/fatigue.  HENT:  Positive for hearing loss.   Cardiovascular:  Positive  for dyspnea on exertion.  Respiratory:  Positive for cough.   Endocrine: Negative.   Hematologic/Lymphatic: Negative.   Musculoskeletal: Negative.   Gastrointestinal: Negative.   Genitourinary: Negative.   Neurological: Negative.     All other ROS reviewed and negative.     Physical Exam/Data:   Vitals:   08/26/21 1756 08/26/21 1844 08/26/21 2041 08/27/21 0853  BP:  (!) 111/56 (!) 126/57 (!) 129/53  Pulse:  (!) 44 (!) 52   Resp:  16  17   Temp:   98.2 F (36.8 C)   TempSrc:   Oral   SpO2:  100% 100%   Weight: 68 kg 68 kg    Height:  5\' 9"  (1.753 m)      Intake/Output Summary (Last 24 hours) at 08/27/2021 0946 Last data filed at 08/27/2021 0300 Gross per 24 hour  Intake 2101.11 ml  Output --  Net 2101.11 ml   Last 3 Weights 08/26/2021 08/26/2021 05/02/2021  Weight (lbs) 150 lb 150 lb 150 lb  Weight (kg) 68.04 kg 68.04 kg 68.04 kg     Body mass index is 22.15 kg/m.  General: Thin, in no acute distress  HEENT: normal Neck: no JVD Vascular: No carotid bruits; Distal pulses 2+ bilaterally Cardiac:  normal S1, S2; RRR; no murmur   Lungs:  decreased breath sounds  Abd: soft, nontender, no hepatomegaly  Ext: no edema Musculoskeletal:  No deformities, BUE and BLE strength normal and equal Skin: warm and dry  Neuro:  CNs 2-12 intact, no focal abnormalities noted Psych:  Normal affect   EKG:  The EKG was personally reviewed and demonstrates:  junctional rhythm 31/m Telemetry:  Telemetry was personally reviewed and demonstrates:  sinus bradycardia, some junctional rhythm.  Relevant CV Studies:  Echo pending  Laboratory Data:  High Sensitivity Troponin:   Recent Labs  Lab 08/26/21 1233 08/26/21 1449  TROPONINIHS 9 11     Chemistry Recent Labs  Lab 08/26/21 1233  NA 134*  K 3.9  CL 102  CO2 22  GLUCOSE 108*  BUN 34*  CREATININE 2.38*  CALCIUM 9.1  GFRNONAA 27*  ANIONGAP 10    No results for input(s): PROT, ALBUMIN, AST, ALT, ALKPHOS, BILITOT in the last  168 hours. Lipids No results for input(s): CHOL, TRIG, HDL, LABVLDL, LDLCALC, CHOLHDL in the last 168 hours.  Hematology Recent Labs  Lab 08/26/21 1233  WBC 3.4*  RBC 3.85*  HGB 11.0*  HCT 32.7*  MCV 84.9  MCH 28.6  MCHC 33.6  RDW 14.1  PLT 142*   Thyroid No results for input(s): TSH, FREET4 in the last 168 hours.  BNPNo results for input(s): BNP, PROBNP in the last 168 hours.  DDimer No results for input(s): DDIMER in the last 168 hours.   Radiology/Studies:  DG Chest 2 View  Result Date: 08/26/2021 CLINICAL DATA:  Shortness of breath and weakness. Cold with cough since Thanksgiving. Generalized weakness. EXAM: CHEST - 2 VIEW COMPARISON:  Chest x-ray dated 01/02/2021. FINDINGS: Heart size and mediastinal contours are stable. Lungs are clear. No pleural effusion or pneumothorax is seen. No acute-appearing osseous abnormality. Mild degenerative spondylosis of the kyphotic thoracic spine. IMPRESSION: No active cardiopulmonary disease. No evidence of pneumonia or pulmonary edema. Electronically Signed   By: Franki Cabot M.D.   On: 08/26/2021 13:36   CT Head Wo Contrast  Result Date: 08/26/2021 CLINICAL DATA:  Neuro deficit, acute, stroke suspected EXAM: CT HEAD WITHOUT CONTRAST TECHNIQUE: Contiguous axial images were obtained from the base of the skull through the vertex without intravenous contrast. COMPARISON:  None. BRAIN: BRAIN Cerebral ventricle sizes are concordant with the degree of cerebral volume loss. Right calvarial convexity hygroma. No evidence of large-territorial acute infarction. No parenchymal hemorrhage. No mass lesion. No extra-axial collection. No mass effect or midline shift. No hydrocephalus. Basilar cisterns are patent. Enlarged pituitary gland measuring 2.7 x 1.5 x 1.9cm. Vascular: No hyperdense vessel. Skull: No acute fracture or focal lesion. Sinuses/Orbits: Mucosal thickening of bilateral sphenoid, maxillary, ethmoid  sinuses. Otherwise paranasal sinuses and  mastoid air cells are clear. The orbits are unremarkable. Other: None. IMPRESSION: 1. Enlarged pituitary gland measuring 2.7 x 1.5 cm. Recommend MRI sella protocol for further evaluation. 2. Mucosal thickening of bilateral sphenoid, maxillary, ethmoid sinuses. Electronically Signed   By: Iven Finn M.D.   On: 08/26/2021 15:34     Assessment and Plan:   Sinus bradycardia-longstanding not on AV nodal agents, seen by Dr. Lovena Le 2018, now with junctional rhythm 31/m  in setting of Covid19 now getting IV fluids. Will discuss with EPS  HTN controlled with amlodipine  HLD on zocor  CKD 3b  Covid19 positive  Tobacco abuse smokes 1/2 ppd   Risk Assessment/Risk Scores:              For questions or updates, please contact [place] Please consult [email or web address] for contact info under    Signed, Ermalinda Barrios, PA-C  08/27/2021 9:46 AM   Patient seen and examined and agree with Ermalinda Barrios, PA-C as detailed above.  In brief, the patient is a 85 y.o. male with a hx of HTN, sinus bradycardia managed conservatively and HLD who presented to the ER with generalized weakness and recent fall found to be COVID positive. Cardiology was consulted for junctional rhythm with HR 31 in the ER.  The patient has a known history of asymptomatic sinus bradycardia for which he has been managed with watchful waiting. Prior to this admission, the patient denies any lightheadedness, dizziness, syncope, chest pain or SOB. During this admission, the patient began feeling weak about a week ago with fall at home where he remained down for 2 hours. He was found to be COVID positive and dehydrated on admission. ECG in ER showed one episode of junctional bradycardia with HR 31. All others were sinus brady with PACs with HR 40s.   Currently, the patient feels much better after IV fluid. He denies any current chest pain, lightheadedness, dizziness or SOB. HR have improved to 40s on telemetry and now in sinus  brady with frequent PACs. After discussion with EP, patient will likely need PPM in the future due to clear evidence of conduction disease on admission. This is not emergent and can wait until he recovers from [place] unless his HR drops and sustain in the 30s again.   GEN: Comfortable, NAD   Neck: No JVD Cardiac: Bradycardic, regular, no murmurs Respiratory: Clear to auscultation bilaterally. GI: Soft, nontender, non-distended  MS: No edema; No deformity. Neuro:  Nonfocal  Psych: Normal affect    Plan: -Patient will be referred to EP as out-patient for PPM placement due to evidence of conduction disease with junctional rhythm on admission with HR 31 -Can allow patient to recover from [place] and pursue PPM as out-patient UNLESS heart rates drop again to the 30s and sustain -Follow-up TTE -Avoid nodal agents -Management of COVID per primary team -Plan discussed with EP; will arrange out-patient follow-up as well  Gwyndolyn Kaufman, MD

## 2021-08-27 NOTE — Plan of Care (Signed)

## 2021-08-28 LAB — CBC WITH DIFFERENTIAL/PLATELET
Abs Immature Granulocytes: 0.01 10*3/uL (ref 0.00–0.07)
Basophils Absolute: 0 10*3/uL (ref 0.0–0.1)
Basophils Relative: 1 %
Eosinophils Absolute: 0.5 10*3/uL (ref 0.0–0.5)
Eosinophils Relative: 13 %
HCT: 27.2 % — ABNORMAL LOW (ref 39.0–52.0)
Hemoglobin: 9.2 g/dL — ABNORMAL LOW (ref 13.0–17.0)
Immature Granulocytes: 0 %
Lymphocytes Relative: 31 %
Lymphs Abs: 1.2 10*3/uL (ref 0.7–4.0)
MCH: 28.8 pg (ref 26.0–34.0)
MCHC: 33.8 g/dL (ref 30.0–36.0)
MCV: 85.3 fL (ref 80.0–100.0)
Monocytes Absolute: 0.4 10*3/uL (ref 0.1–1.0)
Monocytes Relative: 10 %
Neutro Abs: 1.8 10*3/uL (ref 1.7–7.7)
Neutrophils Relative %: 45 %
Platelets: 140 10*3/uL — ABNORMAL LOW (ref 150–400)
RBC: 3.19 MIL/uL — ABNORMAL LOW (ref 4.22–5.81)
RDW: 14.4 % (ref 11.5–15.5)
WBC: 4 10*3/uL (ref 4.0–10.5)
nRBC: 0 % (ref 0.0–0.2)

## 2021-08-28 LAB — COMPREHENSIVE METABOLIC PANEL
ALT: 18 U/L (ref 0–44)
AST: 31 U/L (ref 15–41)
Albumin: 3.5 g/dL (ref 3.5–5.0)
Alkaline Phosphatase: 67 U/L (ref 38–126)
Anion gap: 5 (ref 5–15)
BUN: 30 mg/dL — ABNORMAL HIGH (ref 8–23)
CO2: 19 mmol/L — ABNORMAL LOW (ref 22–32)
Calcium: 8.5 mg/dL — ABNORMAL LOW (ref 8.9–10.3)
Chloride: 111 mmol/L (ref 98–111)
Creatinine, Ser: 1.84 mg/dL — ABNORMAL HIGH (ref 0.61–1.24)
GFR, Estimated: 36 mL/min — ABNORMAL LOW (ref >60)
Glucose, Bld: 76 mg/dL (ref 70–99)
Potassium: 4.1 mmol/L (ref 3.5–5.1)
Sodium: 135 mmol/L (ref 135–145)
Total Bilirubin: 0.3 mg/dL (ref 0.3–1.2)
Total Protein: 6.3 g/dL — ABNORMAL LOW (ref 6.5–8.1)

## 2021-08-28 LAB — FERRITIN: Ferritin: 300 ng/mL (ref 24–336)

## 2021-08-28 LAB — MAGNESIUM: Magnesium: 1.9 mg/dL (ref 1.7–2.4)

## 2021-08-28 LAB — D-DIMER, QUANTITATIVE: D-Dimer, Quant: 4.16 ug/mL-FEU — ABNORMAL HIGH (ref 0.00–0.50)

## 2021-08-28 LAB — C-REACTIVE PROTEIN: CRP: 2.4 mg/dL — ABNORMAL HIGH (ref <1.0)

## 2021-08-28 MED ORDER — GUAIFENESIN-DM 100-10 MG/5ML PO SYRP: 10.0000 mL | ORAL_SOLUTION | ORAL | 0 refills | Status: DC | PRN

## 2021-08-28 MED ORDER — ASCORBIC ACID 500 MG PO TABS: 500.0000 mg | ORAL_TABLET | Freq: Every day | ORAL | 0 refills | Status: AC

## 2021-08-28 MED ORDER — ZINC SULFATE 220 (50 ZN) MG PO CAPS: 220.0000 mg | ORAL_CAPSULE | Freq: Every day | ORAL | 0 refills | Status: AC

## 2021-08-28 MED ORDER — SODIUM CHLORIDE 0.9 % IV SOLN
INTRAVENOUS | Status: AC
  Administered 2021-08-28: 100 mg via INTRAVENOUS
  Filled 2021-08-28: qty 20

## 2021-08-28 MED ORDER — ALBUTEROL SULFATE HFA 108 (90 BASE) MCG/ACT IN AERS: 2.0000 | INHALATION_SPRAY | Freq: Four times a day (QID) | RESPIRATORY_TRACT | 2 refills | Status: DC | PRN

## 2021-08-28 NOTE — TOC Transition Note (Signed)
Transition of Care [facility]) - CM/SW Discharge Note   Patient Details  Name: Nathan [name] MRN: [id number] Date of Birth: 1936-06-26  Transition of Care [facility] CM/SW Contact:  Ihor Gully, LCSW Phone Number: 08/28/2021, 1:22 PM   Clinical Narrative:    Spoke with patient's daughter, Nathan [name]. Discussed PT Recommendation of HHPT. She is agreeable. Discussed [place] providers.  Referral made to and accepted by [facility].    Final next level of care: [place] Barriers to Discharge: [name] Barriers Identified   Patient Goals and CMS Choice        Discharge Placement                       Discharge Plan and Services                          HH Arranged: PT [place] Agency: [place] ([facility]) Date HH Agency Contacted: 08/28/21 Time [place]: [place] Representative spoke with at [place]: [place] Determinants of Health ([place]) Interventions     Readmission Risk Interventions [name] flowsheet data found.

## 2021-08-28 NOTE — Plan of Care (Signed)
  Problem: Health Behavior/Discharge Planning: Goal: Ability to manage health-related needs will improve Outcome: Progressing   

## 2021-08-28 NOTE — Discharge Summary (Signed)
Physician Discharge Summary  DEMONTREZ RINDFLEISCH QIW:[id number] DOB: 12/30/1935 DOA: 08/26/2021  PCP: Rosita Fire, MD  Admit date: 08/26/2021  Discharge date: 08/28/2021  Admitted From:Home  Disposition:  Home  Recommendations for Outpatient Follow-up:  Follow up with PCP in 1-2 weeks Follow-up with EP as arranged by cardiology due to ongoing sinus bradycardia which has been a longstanding issue Continue home medications as prescribed  Home Health: Yes with PT  Equipment/Devices: None  Discharge Condition:Stable  CODE STATUS: Full  Diet recommendation: Heart Healthy  Brief/Interim Summary:  Per HPI: HARBERT FITTERER is a 85 y.o. male with medical history significant of bradycardia, CKD III, gout, HLD. Family reports he has been weak with cough, SOB, DOE since Thanksgiving. He has had poor PO intake. He did have a fall but was uninjured. Due to progressive weakness he was brought to [facility] for evaluation.    He was admitted for COVID-19 infection as well as AKI on CKD in the setting of longstanding bradycardia.  Cardiology recommending outpatient EP evaluation which will be scheduled.  His AKI has now resolved and he is tolerating oral diet with no significant issues.  He has been seen by PT with recommendations for home health PT on discharge as he does live alone.  No other significant shortness of breath or dyspnea on exertion otherwise noted at this point.  He appears to be stable for discharge.  Discharge Diagnoses:  Principal Problem:   Bradycardia with 41-50 beats per minute Active Problems:   Bradycardia   Hypertension   Chronic kidney disease   COVID-19 virus infection  Principal discharge diagnosis: Symptomatic COVID-19 infection with poor p.o. intake and associated prerenal AKI on CKD stage IIIb-resolved.  Sinus bradycardia-persistent.  Discharge Instructions  Discharge Instructions     Diet - low sodium heart healthy   Complete by: As directed    Increase  activity slowly   Complete by: As directed       Allergies as of 08/28/2021   No Known Allergies      Medication List     STOP taking these medications    cephALEXin 500 MG capsule Commonly known as: KEFLEX   nirmatrelvir/ritonavir EUA 20 x 150 MG & 10 x 100MG  Tabs Commonly known as: PAXLOVID       TAKE these medications    albuterol 108 (90 Base) MCG/ACT inhaler Commonly known as: VENTOLIN HFA Inhale 2 puffs into the lungs every 6 (six) hours as needed for wheezing or shortness of breath.   allopurinol 100 MG tablet Commonly known as: ZYLOPRIM Take 200 mg by mouth daily. What changed: Another medication with the same name was removed. Continue taking this medication, and follow the directions you see here.   amLODipine 5 MG tablet Commonly known as: NORVASC Take 5 mg by mouth daily.   ascorbic acid 500 MG tablet Commonly known as: VITAMIN C Take 1 tablet (500 mg total) by mouth daily. Start taking on: August 29, 2021   aspirin EC 81 MG tablet Take 81 mg by mouth daily.   cholecalciferol 1000 units tablet Commonly known as: VITAMIN D Take 1,000 Units by mouth 3 (three) times a week.   guaiFENesin-dextromethorphan 100-10 MG/5ML syrup Commonly known as: ROBITUSSIN DM Take 10 mLs by mouth every 4 (four) hours as needed for cough.   lisinopril 20 MG tablet Commonly known as: ZESTRIL Take 20 mg by mouth daily.   simvastatin 20 MG tablet Commonly known as: ZOCOR Take 1 tablet (20 mg total)  by mouth at bedtime. What changed: when to take this   sodium bicarbonate 650 MG tablet Take 650-1,300 mg by mouth 2 (two) times daily. Takes 2 tabs in the morning and 1 in the evening   zinc sulfate 220 (50 Zn) MG capsule Take 1 capsule (220 mg total) by mouth daily. Start taking on: August 29, 2021        Follow-up Information     Rosita Fire, MD. Schedule an appointment as soon as possible for a visit in 1 week(s).   Specialty: Internal Medicine Contact  information: [place] [address] [phone]                No Known Allergies  Consultations: Cardiology   Procedures/Studies: DG Chest 2 View  Result Date: 08/26/2021 CLINICAL DATA:  Shortness of breath and weakness. Cold with cough since Thanksgiving. Generalized weakness. EXAM: CHEST - 2 VIEW COMPARISON:  Chest x-ray dated 01/02/2021. FINDINGS: Heart size and mediastinal contours are stable. Lungs are clear. No pleural effusion or pneumothorax is seen. No acute-appearing osseous abnormality. Mild degenerative spondylosis of the kyphotic thoracic spine. IMPRESSION: No active cardiopulmonary disease. No evidence of pneumonia or pulmonary edema. Electronically Signed   By: Franki Cabot M.D.   On: 08/26/2021 13:36   CT Head Wo Contrast  Result Date: 08/26/2021 CLINICAL DATA:  Neuro deficit, acute, stroke suspected EXAM: CT HEAD WITHOUT CONTRAST TECHNIQUE: Contiguous axial images were obtained from the base of the skull through the vertex without intravenous contrast. COMPARISON:  None. BRAIN: BRAIN Cerebral ventricle sizes are concordant with the degree of cerebral volume loss. Right calvarial convexity hygroma. No evidence of large-territorial acute infarction. No parenchymal hemorrhage. No mass lesion. No extra-axial collection. No mass effect or midline shift. No hydrocephalus. Basilar cisterns are patent. Enlarged pituitary gland measuring 2.7 x 1.5 x 1.9cm. Vascular: No hyperdense vessel. Skull: No acute fracture or focal lesion. Sinuses/Orbits: Mucosal thickening of bilateral sphenoid, maxillary, ethmoid sinuses. Otherwise paranasal sinuses and mastoid air cells are clear. The orbits are unremarkable. Other: None. IMPRESSION: 1. Enlarged pituitary gland measuring 2.7 x 1.5 cm. Recommend MRI sella protocol for further evaluation. 2. Mucosal thickening of bilateral sphenoid, maxillary, ethmoid sinuses. Electronically Signed   By: Iven Finn M.D.   On:  08/26/2021 15:34   ECHOCARDIOGRAM COMPLETE  Result Date: 08/27/2021    ECHOCARDIOGRAM REPORT   Patient Name:   SCHYLER COUNSELL Date of Exam: 08/27/2021 Medical Rec #:  [id number]         Height:       69.0 in Accession #:    [id number]        Weight:       150.0 lb Date of Birth:  09-25-1935        BSA:          1.828 m Patient Age:    7 years          BP:           126/57 mmHg Patient Gender: M                 HR:           38 bpm. Exam Location:  [facility] Procedure: 2D Echo, Cardiac Doppler and Color Doppler Indications:    Atrial Fibrillation  History:        Patient has no prior history of Echocardiogram examinations.  Arrythmias:Bradycardia and Atrial Fibrillation; Risk                 Factors:Hypertension. Tobacco Abuse, COVID +.  Sonographer:    Wenda Low Referring Phys: [place]  1. Bradycardic during exam with HR 40.  2. Left ventricular ejection fraction, by estimation, is 55 to 60%. The left ventricle has normal function. The left ventricle has no regional wall motion abnormalities. Left ventricular diastolic parameters were normal.  3. Right ventricular systolic function is normal. The right ventricular size is normal.  4. Left atrial size was mildly dilated.  5. The mitral valve is normal in structure. Trivial mitral valve regurgitation. No evidence of mitral stenosis.  6. The aortic valve is tricuspid. There is mild calcification of the aortic valve. There is mild thickening of the aortic valve. Aortic valve regurgitation is not visualized. Aortic valve sclerosis/calcification is present, without any evidence of aortic stenosis. Comparison(s): No prior Echocardiogram. FINDINGS  Left Ventricle: Left ventricular ejection fraction, by estimation, is 55 to 60%. The left ventricle has normal function. The left ventricle has no regional wall motion abnormalities. The left ventricular internal cavity size was normal in size. There is  no left ventricular  hypertrophy. Left ventricular diastolic parameters were normal. Right Ventricle: The right ventricular size is normal. No increase in right ventricular wall thickness. Right ventricular systolic function is normal. Left Atrium: Left atrial size was mildly dilated. Right Atrium: Right atrial size was normal in size. Pericardium: There is no evidence of pericardial effusion. Mitral Valve: The mitral valve is normal in structure. Trivial mitral valve regurgitation. No evidence of mitral valve stenosis. MV peak gradient, 1.3 mmHg. The mean mitral valve gradient is 0.0 mmHg. Tricuspid Valve: The tricuspid valve is normal in structure. Tricuspid valve regurgitation is trivial. Aortic Valve: The aortic valve is tricuspid. There is mild calcification of the aortic valve. There is mild thickening of the aortic valve. Aortic valve regurgitation is not visualized. Aortic valve sclerosis/calcification is present, without any evidence of aortic stenosis. Aortic valve mean gradient measures 3.0 mmHg. Aortic valve peak gradient measures 6.2 mmHg. Pulmonic Valve: The pulmonic valve was not well visualized. Pulmonic valve regurgitation is trivial. Aorta: The aortic root is normal in size and structure. Venous: The inferior vena cava was not well visualized. IAS/Shunts: No atrial level shunt detected by color flow Doppler.  LEFT VENTRICLE PLAX 2D LVIDd:         5.00 cm      Diastology LVIDs:         3.80 cm      LV e' medial:    7.29 cm/s LV PW:         1.10 cm      LV E/e' medial:  7.4 LV IVS:        1.10 cm      LV e' lateral:   10.30 cm/s                             LV E/e' lateral: 5.2  LV Volumes (MOD) LV vol d, MOD A2C: 105.0 ml LV vol d, MOD A4C: 55.6 ml LV vol s, MOD A2C: 53.5 ml LV vol s, MOD A4C: 22.4 ml LV SV MOD A2C:     51.5 ml LV SV MOD A4C:     55.6 ml LV SV MOD BP:      44.6 ml RIGHT VENTRICLE RV Basal diam:  3.80  cm RV Mid diam:    3.60 cm LEFT ATRIUM             Index        RIGHT ATRIUM           Index LA diam:         4.00 cm 2.19 cm/m   RA Area:     21.00 cm LA Vol (A2C):   75.6 ml 41.35 ml/m  RA Volume:   61.30 ml  33.53 ml/m LA Vol (A4C):   60.4 ml 33.04 ml/m LA Biplane Vol: 69.7 ml 38.12 ml/m  AORTIC VALVE                   PULMONIC VALVE AV Vmax:           125.00 cm/s PV Vmax:       0.67 m/s AV Vmean:          80.200 cm/s PV Peak grad:  1.8 mmHg AV VTI:            0.318 m AV Peak Grad:      6.2 mmHg AV Mean Grad:      3.0 mmHg LVOT Vmax:         82.90 cm/s LVOT Vmean:        53.000 cm/s LVOT VTI:          0.214 m LVOT/AV VTI ratio: 0.67  AORTA Ao Root diam: 3.67 cm MITRAL VALVE MV Area (PHT): 1.79 cm    SHUNTS MV Peak grad:  1.3 mmHg    Systemic VTI: 0.21 m MV Mean grad:  0.0 mmHg MV Vmax:       0.56 m/s MV Vmean:      23.7 cm/s MV Decel Time: 424 msec MV E velocity: 53.80 cm/s MV A velocity: 35.20 cm/s MV E/A ratio:  1.53 Gwyndolyn Kaufman MD Electronically signed by Gwyndolyn Kaufman MD Signature Date/Time: 08/27/2021/4:32:33 PM    Final      Discharge Exam: Vitals:   08/28/21 0555 08/28/21 1032  BP: (!) 123/50 123/66  Pulse: 93 (!) 55  Resp: 16   Temp: 98.4 F (36.9 C)   SpO2: 100%    Vitals:   08/27/21 1332 08/27/21 2135 08/28/21 0555 08/28/21 1032  BP: (!) 110/54 (!) 117/51 (!) 123/50 123/66  Pulse: 68 72 93 (!) 55  Resp: 16 17 16    Temp: 98.2 F (36.8 C) 98.2 F (36.8 C) 98.4 F (36.9 C)   TempSrc: Oral Oral    SpO2: 100% 100% 100%   Weight:      Height:        General: Pt is alert, awake, not in acute distress Cardiovascular: RRR, S1/S2 +, no rubs, no gallops, bradycardic Respiratory: CTA bilaterally, no wheezing, no rhonchi Abdominal: Soft, NT, ND, bowel sounds + Extremities: no edema, no cyanosis    The results of significant diagnostics from this hospitalization (including imaging, microbiology, ancillary and laboratory) are listed below for reference.     Microbiology: Recent Results (from the past 240 hour(s))  Resp Panel by RT-PCR (Flu A&B, Covid) Nasopharyngeal  Swab     Status: Abnormal   Collection Time: 08/26/21  2:07 PM   Specimen: Nasopharyngeal Swab; Nasopharyngeal(NP) swabs in vial transport medium  Result Value Ref Range Status   SARS Coronavirus 2 by RT PCR POSITIVE (A) NEGATIVE Final    Comment: CRITICAL RESULT CALLED TO, READ BACK BY AND VERIFIED WITH:  C GROSE @ 9675 08/26/21 BY STEPHTR (  NOTE) SARS-CoV-2 target nucleic acids are DETECTED.  The SARS-CoV-2 RNA is generally detectable in upper respiratory specimens during the acute phase of infection. Positive results are indicative of the presence of the identified virus, but do not rule out bacterial infection or co-infection with other pathogens not detected by the test. Clinical correlation with patient history and other diagnostic information is necessary to determine patient infection status. The expected result is Negative.  Fact Sheet for Patients: EntrepreneurPulse.com.au  Fact Sheet for Healthcare Providers: IncredibleEmployment.be  This test is not yet approved or cleared by the [place] FDA and  has been authorized for detection and/or diagnosis of SARS-CoV-2 by FDA under an Emergency Use Authorization (EUA).  This EUA will remain in effect (meaning this t est can be used) for the duration of  the COVID-19 declaration under Section 564(b)(1) of the Act, 21 U.S.C. section 360bbb-3(b)(1), unless the authorization is terminated or revoked sooner.     Influenza A by PCR NEGATIVE NEGATIVE Final   Influenza B by PCR NEGATIVE NEGATIVE Final    Comment: (NOTE) The Xpert Xpress SARS-CoV-2/FLU/RSV plus assay is intended as an aid in the diagnosis of influenza from Nasopharyngeal swab specimens and should not be used as a sole basis for treatment. Nasal washings and aspirates are unacceptable for Xpert Xpress SARS-CoV-2/FLU/RSV testing.  Fact Sheet for Patients: EntrepreneurPulse.com.au  Fact Sheet for Healthcare  Providers: IncredibleEmployment.be  This test is not yet approved or cleared by the [place] FDA and has been authorized for detection and/or diagnosis of SARS-CoV-2 by FDA under an Emergency Use Authorization (EUA). This EUA will remain in effect (meaning this test can be used) for the duration of the COVID-19 declaration under Section 564(b)(1) of the Act, 21 U.S.C. section 360bbb-3(b)(1), unless the authorization is terminated or revoked.  Performed at [facility], [address], [place], [place] [address]   Culture, blood (Routine X 2) w Reflex to ID Panel     Status: None (Preliminary result)   Collection Time: 08/26/21  2:27 PM   Specimen: BLOOD LEFT ARM  Result Value Ref Range Status   Specimen Description BLOOD LEFT ARM BOTTLES DRAWN AEROBIC AND ANAEROBIC  Final   Special Requests Blood Culture adequate volume  Final   Culture   Final    NO GROWTH 2 DAYS Performed at [facility], [address], [place], [place] [address]    Report Status PENDING  Incomplete     Labs: BNP (last 3 results) No results for input(s): BNP in the last 8760 hours. Basic Metabolic Panel: Recent Labs  Lab 08/26/21 1233 08/28/21 0650  NA 134* 135  K 3.9 4.1  CL 102 111  CO2 22 19*  GLUCOSE 108* 76  BUN 34* 30*  CREATININE 2.38* 1.84*  CALCIUM 9.1 8.5*  MG  --  1.9   Liver Function Tests: Recent Labs  Lab 08/28/21 0650  AST 31  ALT 18  ALKPHOS 67  BILITOT 0.3  PROT 6.3*  ALBUMIN 3.5   No results for input(s): LIPASE, AMYLASE in the last 168 hours. No results for input(s): AMMONIA in the last 168 hours. CBC: Recent Labs  Lab 08/26/21 1233 08/28/21 0650  WBC 3.4* 4.0  NEUTROABS  --  1.8  HGB 11.0* 9.2*  HCT 32.7* 27.2*  MCV 84.9 85.3  PLT 142* 140*   Cardiac Enzymes: No results for input(s): CKTOTAL, CKMB, CKMBINDEX, TROPONINI in the last 168 hours. BNP: Invalid input(s): POCBNP CBG: No results for input(s): GLUCAP in the  last 168  hours. D-Dimer Recent Labs    08/28/21 0650  DDIMER 4.16*   Hgb A1c No results for input(s): HGBA1C in the last 72 hours. Lipid Profile No results for input(s): CHOL, HDL, LDLCALC, TRIG, CHOLHDL, LDLDIRECT in the last 72 hours. Thyroid function studies No results for input(s): TSH, T4TOTAL, T3FREE, THYROIDAB in the last 72 hours.  Invalid input(s): FREET3 Anemia work up Recent Labs    08/28/21 0650  FERRITIN 300   Urinalysis    Component Value Date/Time   COLORURINE YELLOW 08/26/2021 [place] 08/26/2021 1505   LABSPEC 1.015 08/26/2021 1505   PHURINE 7.0 08/26/2021 1505   GLUCOSEU NEGATIVE 08/26/2021 1505   HGBUR TRACE (A) 08/26/2021 1505   BILIRUBINUR NEGATIVE 08/26/2021 1505   KETONESUR NEGATIVE 08/26/2021 1505   PROTEINUR NEGATIVE 08/26/2021 1505   NITRITE NEGATIVE 08/26/2021 1505   LEUKOCYTESUR NEGATIVE 08/26/2021 1505   Sepsis Labs Invalid input(s): PROCALCITONIN,  WBC,  LACTICIDVEN Microbiology Recent Results (from the past 240 hour(s))  Resp Panel by RT-PCR (Flu A&B, Covid) Nasopharyngeal Swab     Status: Abnormal   Collection Time: 08/26/21  2:07 PM   Specimen: Nasopharyngeal Swab; Nasopharyngeal(NP) swabs in vial transport medium  Result Value Ref Range Status   SARS Coronavirus 2 by RT PCR POSITIVE (A) NEGATIVE Final    Comment: CRITICAL RESULT CALLED TO, READ BACK BY AND VERIFIED WITH:  C GROSE @ 9604 08/26/21 BY STEPHTR (NOTE) SARS-CoV-2 target nucleic acids are DETECTED.  The SARS-CoV-2 RNA is generally detectable in upper respiratory specimens during the acute phase of infection. Positive results are indicative of the presence of the identified virus, but do not rule out bacterial infection or co-infection with other pathogens not detected by the test. Clinical correlation with patient history and other diagnostic information is necessary to determine patient infection status. The expected result is Negative.  Fact Sheet for  Patients: EntrepreneurPulse.com.au  Fact Sheet for Healthcare Providers: IncredibleEmployment.be  This test is not yet approved or cleared by the [place] FDA and  has been authorized for detection and/or diagnosis of SARS-CoV-2 by FDA under an Emergency Use Authorization (EUA).  This EUA will remain in effect (meaning this t est can be used) for the duration of  the COVID-19 declaration under Section 564(b)(1) of the Act, 21 U.S.C. section 360bbb-3(b)(1), unless the authorization is terminated or revoked sooner.     Influenza A by PCR NEGATIVE NEGATIVE Final   Influenza B by PCR NEGATIVE NEGATIVE Final    Comment: (NOTE) The Xpert Xpress SARS-CoV-2/FLU/RSV plus assay is intended as an aid in the diagnosis of influenza from Nasopharyngeal swab specimens and should not be used as a sole basis for treatment. Nasal washings and aspirates are unacceptable for Xpert Xpress SARS-CoV-2/FLU/RSV testing.  Fact Sheet for Patients: EntrepreneurPulse.com.au  Fact Sheet for Healthcare Providers: IncredibleEmployment.be  This test is not yet approved or cleared by the [place] FDA and has been authorized for detection and/or diagnosis of SARS-CoV-2 by FDA under an Emergency Use Authorization (EUA). This EUA will remain in effect (meaning this test can be used) for the duration of the COVID-19 declaration under Section 564(b)(1) of the Act, 21 U.S.C. section 360bbb-3(b)(1), unless the authorization is terminated or revoked.  Performed at [facility], [address] [place], [place] [address]   Culture, blood (Routine X 2) w Reflex to ID Panel     Status: None (Preliminary result)   Collection Time: 08/26/21  2:27 PM   Specimen:  BLOOD LEFT ARM  Result Value Ref Range Status   Specimen Description BLOOD LEFT ARM BOTTLES DRAWN AEROBIC AND ANAEROBIC  Final   Special Requests Blood Culture adequate volume  Final    Culture   Final    NO GROWTH 2 DAYS Performed at [facility], [address], [place], [place] [address]    Report Status PENDING  Incomplete     Time coordinating discharge: 35 minutes  SIGNED:   Rodena Goldmann, DO [facility] 08/28/2021, 12:23 PM  If 7PM-7AM, please contact night-coverage [email or web address]

## 2021-08-28 NOTE — Evaluation (Signed)
Physical Therapy Evaluation Patient Details Name: Nathan [name] MRN: [id number] DOB: Jan 01, 1936 Today's Date: 08/28/2021  History of Present Illness  Nathan [name] is a 85 y.o. male with medical history significant of bradycardia, CKD III, gout, HLD. Family reports he has been weak with cough, SOB, DOE since Thanksgiving. He has had poor PO intake. He did have a fall but was uninjured. Due to progressive weakness he was brought to [facility] ED for evaluation.   Clinical Impression  Patient functioning near baseline for functional mobility and gait demonstrating good return for ambulation in room, transferring to/from commode in bathroom and chair at bedside without loss of balance or use of an AD.  Patient encouraged to stay out of bed ambulate as tolerated in room for length of stay.  Plan:  Patient discharged from physical therapy to care of nursing for ambulation daily as tolerated for length of stay.         Recommendations for follow up therapy are one component of a multi-disciplinary discharge planning process, led by the attending physician.  Recommendations may be updated based on patient status, additional functional criteria and insurance authorization.  Follow Up Recommendations Home health PT    Assistance Recommended at Discharge PRN  Functional Status Assessment Patient has had a recent decline in their functional status and demonstrates the ability to make significant improvements in function in a reasonable and predictable amount of time.  Equipment Recommendations  None recommended by PT    Recommendations for Other Services       Precautions / Restrictions Precautions Precautions: None Restrictions Weight Bearing Restrictions: No      Mobility  Bed Mobility Overal bed mobility: Modified Independent                  Transfers Overall transfer level: Modified independent                      Ambulation/Gait Ambulation/Gait assistance:  Modified independent (Device/Increase time) Gait Distance (Feet): 100 Feet Assistive device: IV Pole;None Gait Pattern/deviations: WFL(Within Functional Limits) Gait velocity: slightly decreased     General Gait Details: grossly WFL with good return for ambulation in room without AD and pushing IV Pole without loss of balance  Stairs            Wheelchair Mobility    Modified Rankin (Stroke Patients Only)       Balance Overall balance assessment: No apparent balance deficits (not formally assessed)                                           Pertinent Vitals/Pain Pain Assessment: No/denies pain    Home Living Family/patient expects to be discharged to:: Private residence Living Arrangements: Alone Available Help at Discharge: Family;Available PRN/intermittently Type of Home: House Home Access: Stairs to enter Entrance Stairs-Rails: Left Entrance Stairs-Number of Steps: 2   Home Layout: One level Home Equipment: Cane - single point;Shower Conservator, museum/gallery (2 wheels)      Prior Function Prior Level of Function : Independent/Modified Independent             Mobility Comments: household and short distanced community ambulator using SPC PRN, drives ADLs Comments: has meals delivered to home, Independent with ADLs     Hand Dominance        Extremity/Trunk Assessment   Upper Extremity Assessment Upper Extremity Assessment:  Overall [facility] for tasks assessed    Lower Extremity Assessment Lower Extremity Assessment: Overall WFL for tasks assessed    Cervical / Trunk Assessment Cervical / Trunk Assessment: Normal  Communication   Communication: No difficulties  Cognition Arousal/Alertness: Awake/alert Behavior During Therapy: WFL for tasks assessed/performed Overall Cognitive Status: Within Functional Limits for tasks assessed                                          General Comments      Exercises      Assessment/Plan    PT Assessment All further PT needs can be met in the next venue of care  PT Problem List Decreased activity tolerance;Decreased mobility;Decreased strength       PT Treatment Interventions      PT Goals (Current goals can be found in the Care Plan section)  Acute Rehab PT Goals Patient Stated Goal: return home with family to assist PT Goal Formulation: With patient Time For Goal Achievement: 08/28/21 Potential to Achieve Goals: Good    Frequency     Barriers to discharge        Co-evaluation               AM-PAC PT "6 Clicks" Mobility  Outcome Measure Help needed turning from your back to your side while in a flat bed without using bedrails?: None Help needed moving from lying on your back to sitting on the side of a flat bed without using bedrails?: None Help needed moving to and from a bed to a chair (including a wheelchair)?: None Help needed standing up from a chair using your arms (e.g., wheelchair or bedside chair)?: None Help needed to walk in hospital room?: None Help needed climbing 3-5 steps with a railing? : A Little 6 Click Score: 23    End of Session   Activity Tolerance: Patient tolerated treatment well;Patient limited by fatigue Patient left: in chair;with call bell/phone within reach Nurse Communication: Mobility status PT Visit Diagnosis: Unsteadiness on feet (R26.81);Other abnormalities of gait and mobility (R26.89);Muscle weakness (generalized) (M62.81)    Time: 4268-3419 PT Time Calculation (min) (ACUTE ONLY): 30 min   Charges:   PT Evaluation $PT Eval Moderate Complexity: 1 Mod PT Treatments $Therapeutic Activity: 23-37 mins        12:23 PM, 08/28/21 Lonell Grandchild, MPT Physical Therapist with [facility] 336 [phone] office [phone] mobile phone

## 2021-08-28 NOTE — Progress Notes (Signed)
Nsg Discharge Note  Admit Date:  08/26/2021 Discharge date: 08/28/2021   Nathan [name] to be D/C'd Home per MD order.  AVS completed.  Copy for chart, and copy for patient signed, and dated. Patient/caregiver able to verbalize understanding.  Discharge Medication: Allergies as of 08/28/2021   No Known Allergies      Medication List     STOP taking these medications    cephALEXin 500 MG capsule Commonly known as: KEFLEX   nirmatrelvir/ritonavir EUA 20 x 150 MG & 10 x 100MG  Tabs Commonly known as: PAXLOVID       TAKE these medications    albuterol 108 (90 Base) MCG/ACT inhaler Commonly known as: VENTOLIN HFA Inhale 2 puffs into the lungs every 6 (six) hours as needed for wheezing or shortness of breath.   allopurinol 100 MG tablet Commonly known as: ZYLOPRIM Take 200 mg by mouth daily. What changed: Another medication with the same name was removed. Continue taking this medication, and follow the directions you see here.   amLODipine 5 MG tablet Commonly known as: NORVASC Take 5 mg by mouth daily.   ascorbic acid 500 MG tablet Commonly known as: VITAMIN C Take 1 tablet (500 mg total) by mouth daily. Start taking on: August 29, 2021   aspirin EC 81 MG tablet Take 81 mg by mouth daily.   cholecalciferol 1000 units tablet Commonly known as: VITAMIN D Take 1,000 Units by mouth 3 (three) times a week.   guaiFENesin-dextromethorphan 100-10 MG/5ML syrup Commonly known as: ROBITUSSIN DM Take 10 mLs by mouth every 4 (four) hours as needed for cough.   lisinopril 20 MG tablet Commonly known as: ZESTRIL Take 20 mg by mouth daily.   simvastatin 20 MG tablet Commonly known as: ZOCOR Take 1 tablet (20 mg total) by mouth at bedtime. What changed: when to take this   sodium bicarbonate 650 MG tablet Take 650-1,300 mg by mouth 2 (two) times daily. Takes 2 tabs in the morning and 1 in the evening   zinc sulfate 220 (50 Zn) MG capsule Take 1 capsule (220 mg total)  by mouth daily. Start taking on: August 29, 2021        Discharge Assessment: Vitals:   08/28/21 0555 08/28/21 1032  BP: (!) 123/50 123/66  Pulse: 93 (!) 55  Resp: 16   Temp: 98.4 F (36.9 C)   SpO2: 100%    Skin clean, dry and intact without evidence of skin break down, no evidence of skin tears noted. IV catheter discontinued intact. Site without signs and symptoms of complications - no redness or edema noted at insertion site, patient denies c/o pain - only slight tenderness at site.  Dressing with slight pressure applied.  D/c Instructions-Education: Discharge instructions given to patient/family with verbalized understanding. D/c education completed with patient/family including follow up instructions, medication list, d/c activities limitations if indicated, with other d/c instructions as indicated by MD - patient able to verbalize understanding, all questions fully answered. Patient instructed to return to ED, call 911, or call MD for any changes in condition.  Patient escorted via [place], and D/C home via private auto.  Dorcas Mcmurray, LPN 29/01/2840 3:24 PM

## 2021-08-28 NOTE — Progress Notes (Signed)
Discharge instructions given on medications and follow up visits,patient and verbalized understanding. Prescriptions sent to Pharmacy of choice documented on AVS. Vital signs stable. Accompanied by staff to an awaiting vehicle.Marland Kitchen

## 2021-08-28 NOTE — Progress Notes (Signed)
Patient known to me from cardiology clinic, Dr Jacolyn Reedy rounding note reviewed. Long history of sinus brady that has been asymptomatic under watchful waiting as outpatient. During this admit episode of junctional to 30s, other otherwise primarily sinus brady  30s to 40s. BP"s have been stable and patient has been asymptomatic from rhythm standpoint, being managed for COVID by primary team. Plan is for outpatient EP follow up, we will f/u telemetry tomorrow. Echo shows normal LVE, no WMAs, no significant valvular disease   Carlyle Dolly MD

## 2021-08-31 LAB — CULTURE, BLOOD (ROUTINE X 2)
Culture: NO GROWTH
Special Requests: ADEQUATE

## 2021-09-25 ENCOUNTER — Encounter (HOSPITAL_COMMUNITY): Payer: Self-pay | Admitting: Radiology

## 2021-12-17 ENCOUNTER — Other Ambulatory Visit: Payer: Self-pay | Admitting: Student

## 2022-01-26 IMAGING — CT CT HEAD W/O CM
3 series · 15 of 47 positions shown, 18 images · non-contrast
Comparison: None.

CLINICAL DATA: Neuro deficit, acute, stroke suspected

EXAM:
CT HEAD WITHOUT CONTRAST
TECHNIQUE: Contiguous axial images were obtained from the base of the skull
through the vertex without intravenous contrast.

[Series 2: head w o · axial · 0.47mm/px · z∈[+3,+153]mm · 9 of 36 slices shown, 12 images]
[im 3/36  brain]
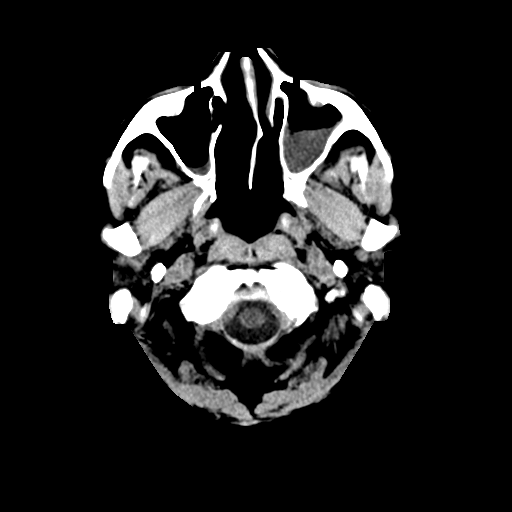
[im 3/36  bone]
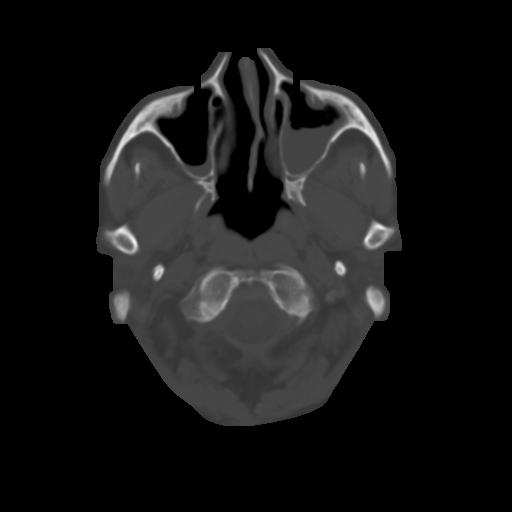
[im 7/36  brain]
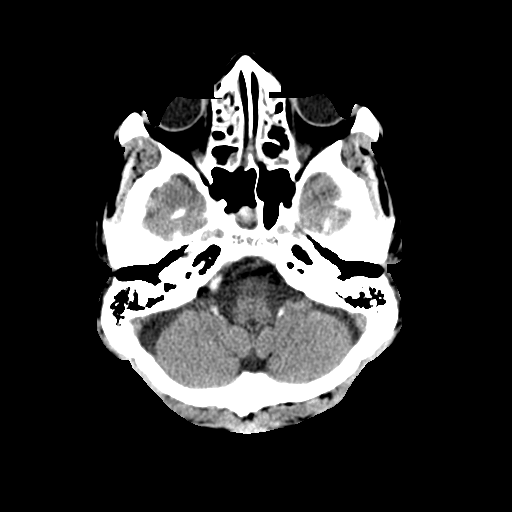
[im 10/36  brain]
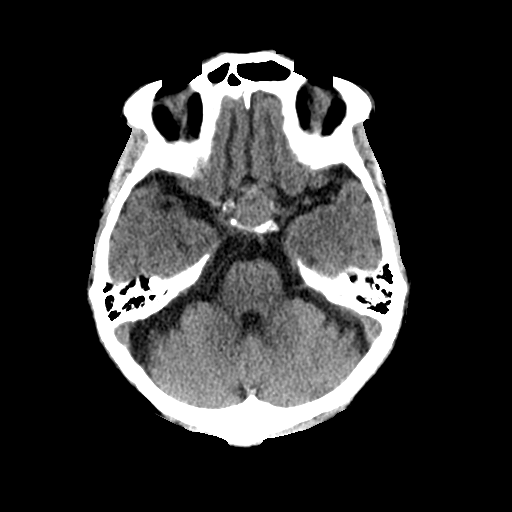
[im 14/36  brain]
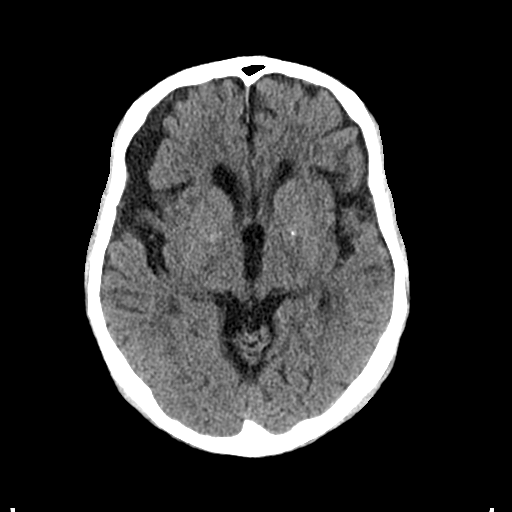
[im 19/36  brain]
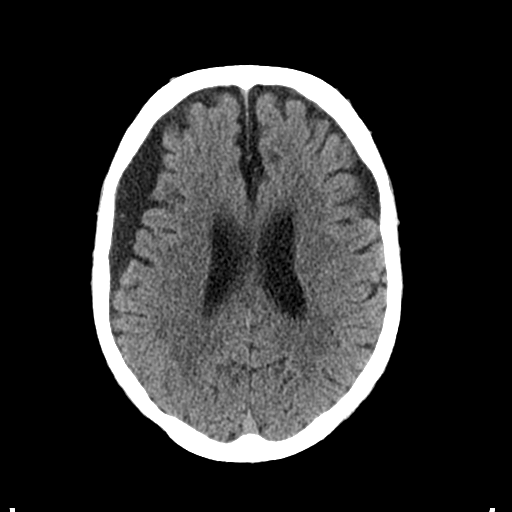
[im 19/36  bone]
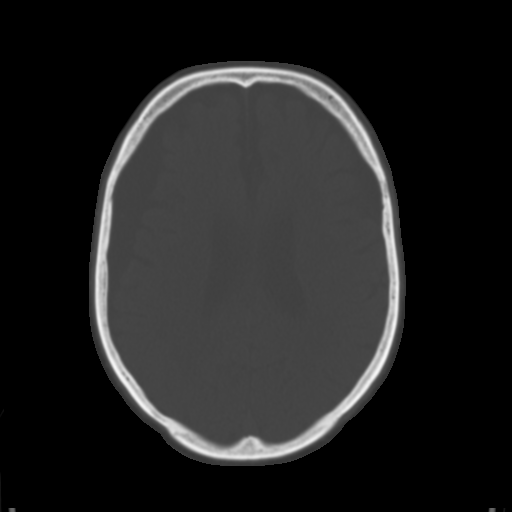
[im 22/36  brain]
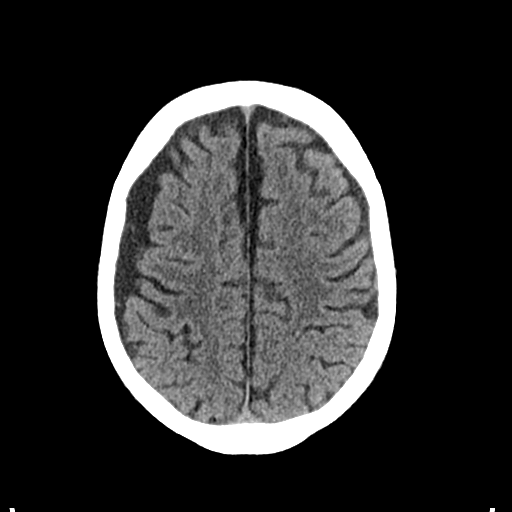
[im 26/36  brain]
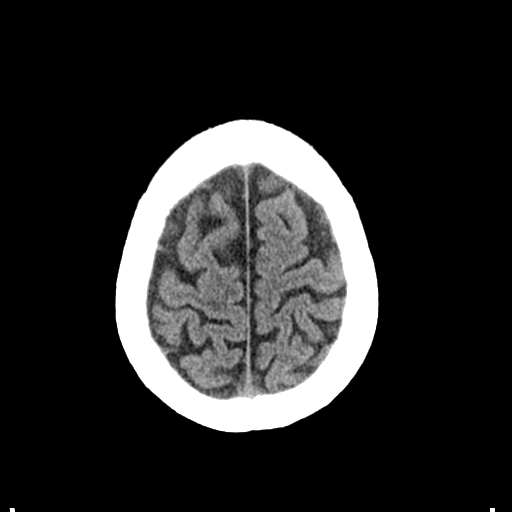
[im 29/36  brain]
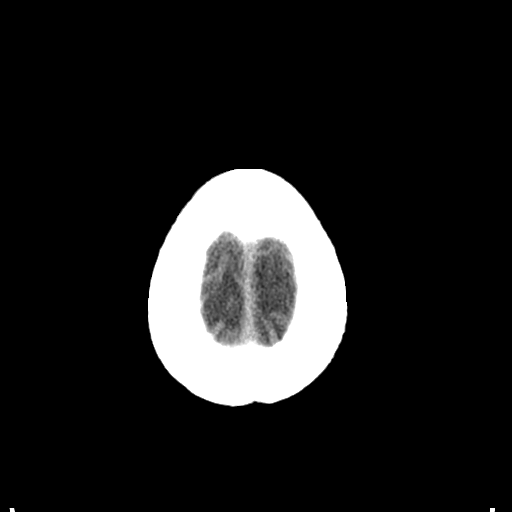
[im 33/36  brain]
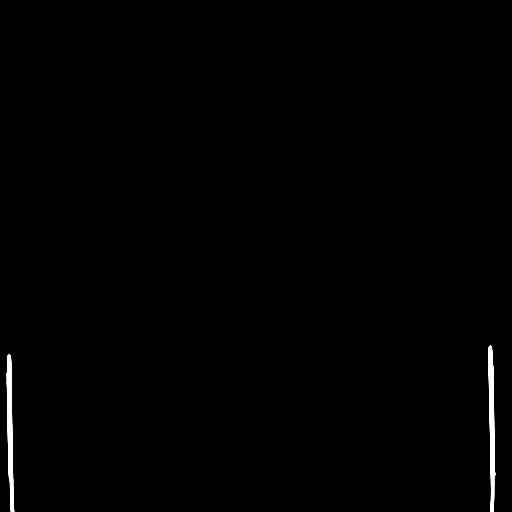
[im 33/36  bone]
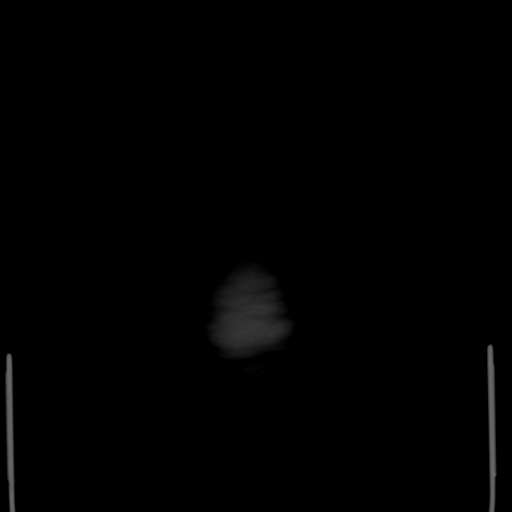

[Series 4: coronal soft · coronal · 0.34mm/px · 3 of 75 slices shown]
[im 25/75  brain]
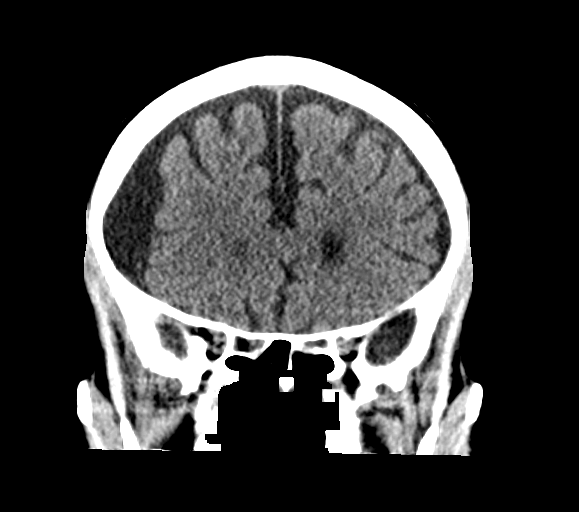
[im 33/75  brain]
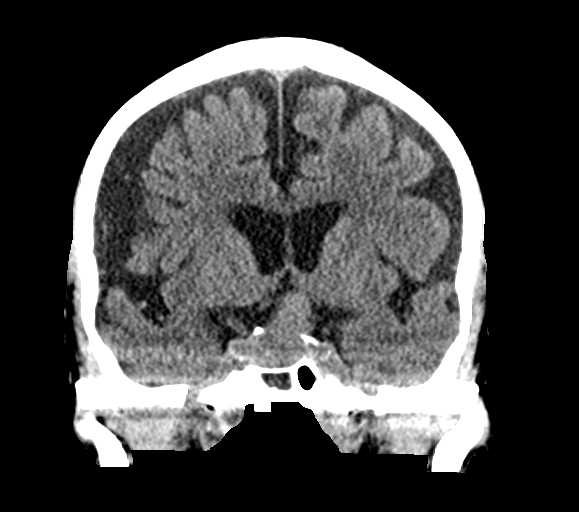
[im 42/75  brain]
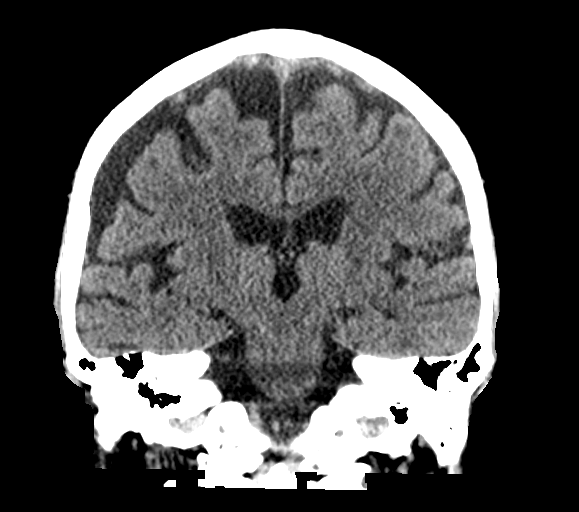

[Series 5: sagittal soft · sagittal · 0.34mm/px · 3 of 64 slices shown]
[im 22/64  brain]
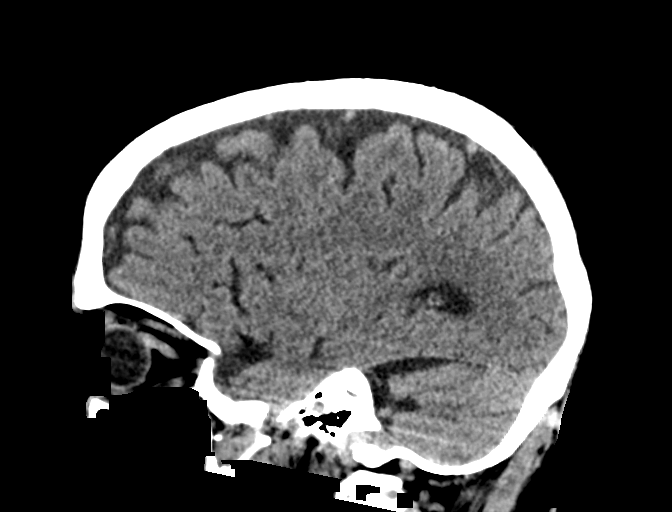
[im 32/64  brain]
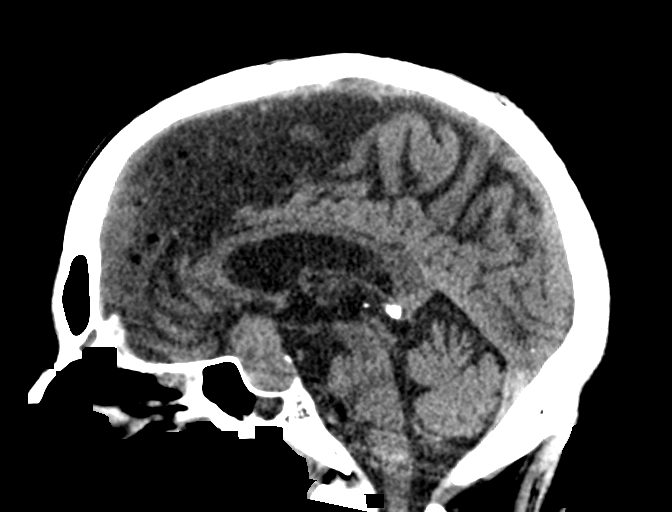
[im 43/64  brain]
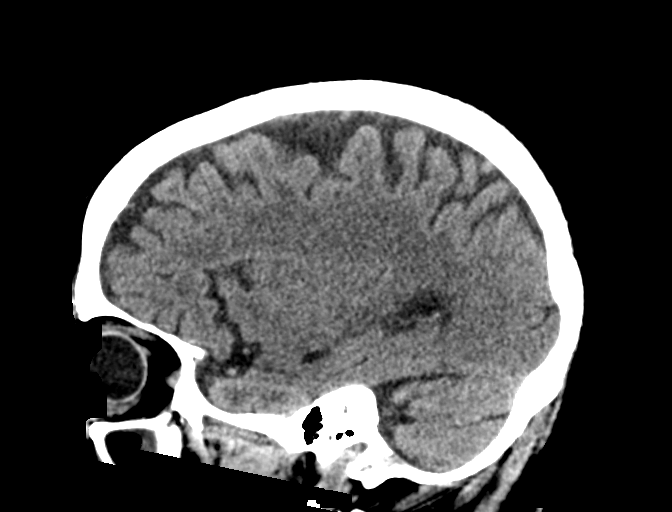

[15 of 47 positions shown; findings below may reference images not displayed]

BRAIN:
BRAIN
Cerebral ventricle sizes are concordant with the degree of cerebral
volume loss. Right calvarial convexity hygroma.

No evidence of large-territorial acute infarction. No parenchymal
hemorrhage. No mass lesion. No extra-axial collection.

No mass effect or midline shift. No hydrocephalus. Basilar cisterns
are patent.

Enlarged pituitary gland measuring 2.7 x 1.5 x 1.9cm.

Vascular: No hyperdense vessel.

Skull: No acute fracture or focal lesion.

Sinuses/Orbits: Mucosal thickening of bilateral sphenoid, maxillary,
ethmoid sinuses. Otherwise paranasal sinuses and mastoid air cells
are clear. The orbits are unremarkable.

Other: None.
IMPRESSION: 1. Enlarged pituitary gland measuring 2.7 x 1.5 cm. Recommend MRI
sella protocol for further evaluation.
2. Mucosal thickening of bilateral sphenoid, maxillary, ethmoid
sinuses.

## 2022-01-26 IMAGING — DX DG CHEST 2V
2 series · 2 of 2 positions shown · non-contrast
Comparison: Chest x-ray dated 01/02/2021.

CLINICAL DATA: Shortness of breath and weakness. Cold with cough
since Thanksgiving. Generalized weakness.

EXAM:
CHEST - 2 VIEW

[chest lat]
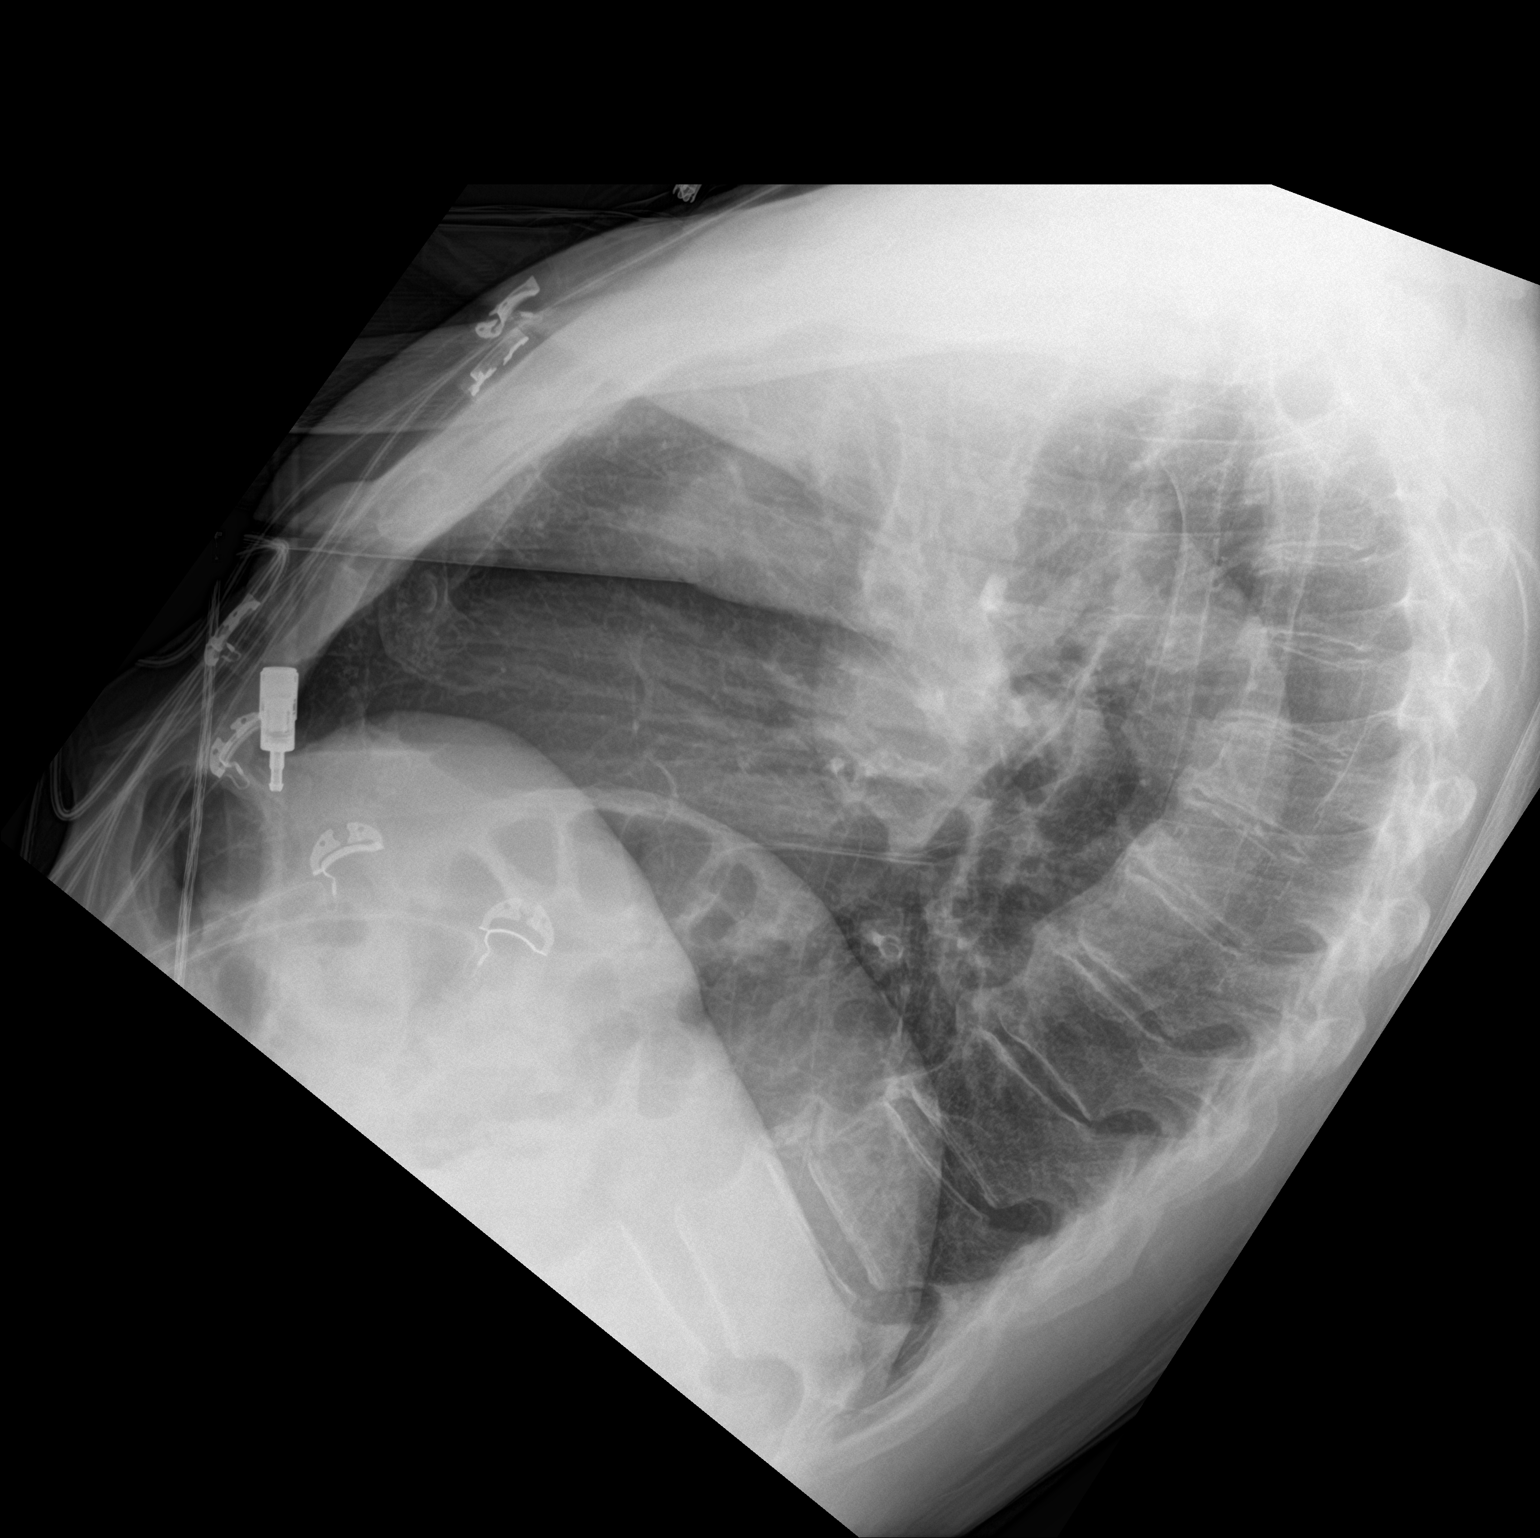

[chest ap]
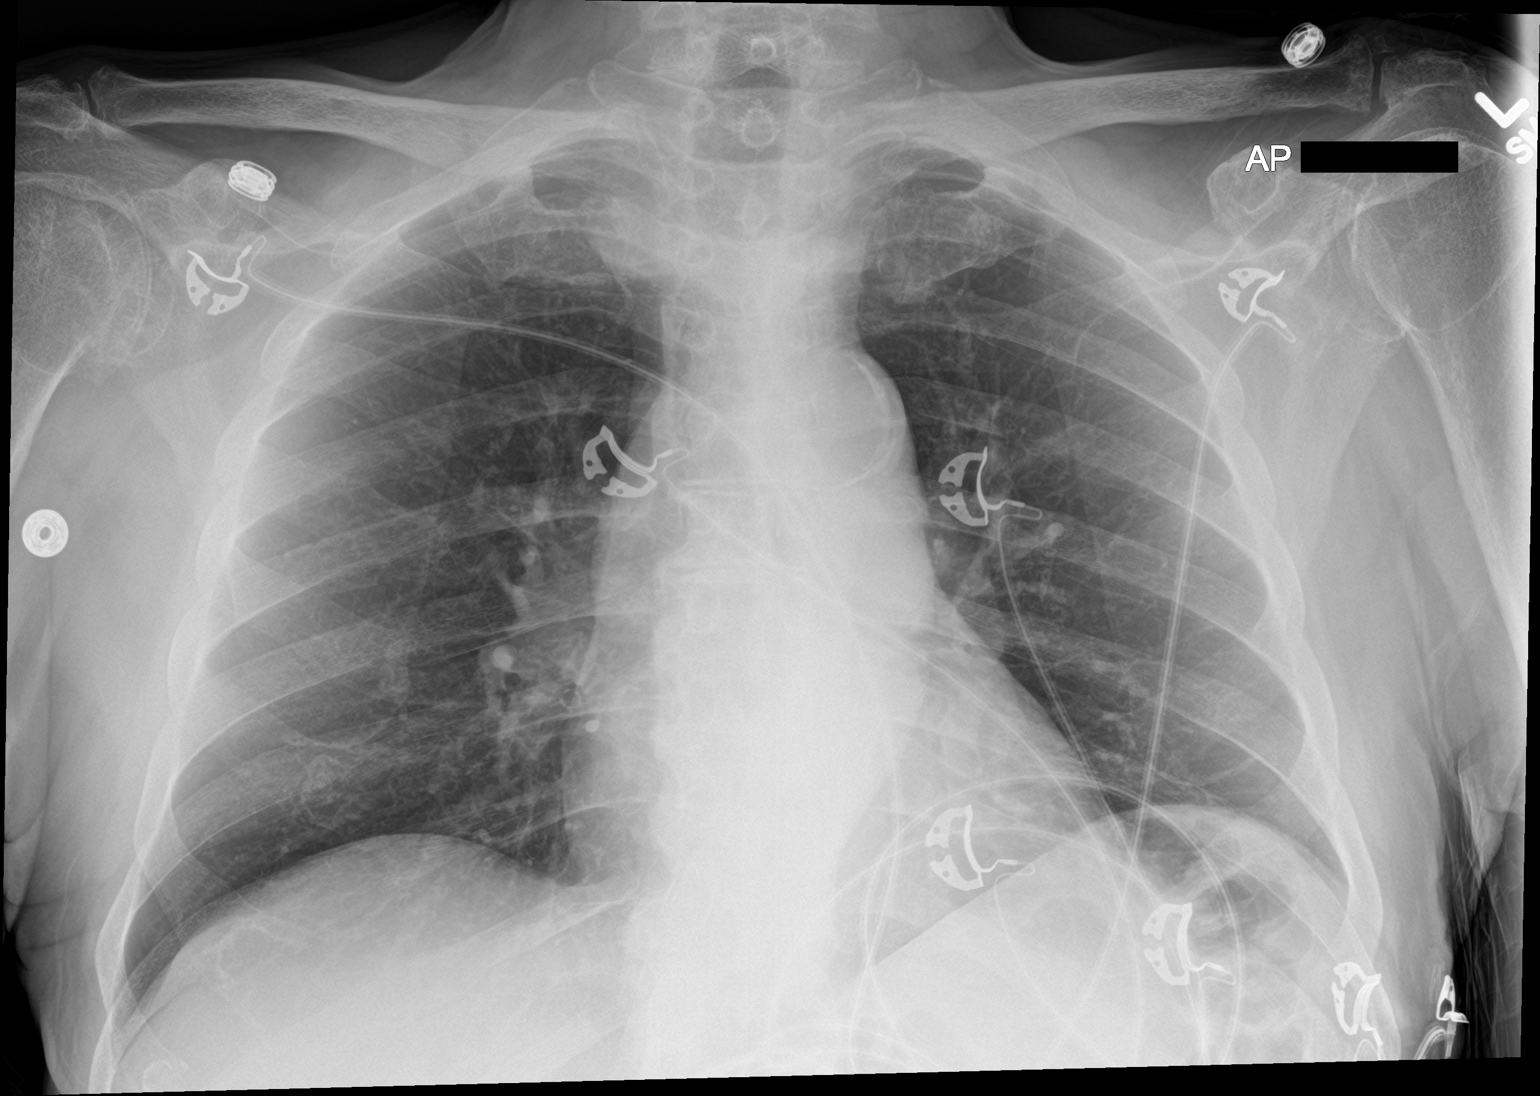

[2 of 2 positions shown; findings below may reference images not displayed]

FINDINGS: Heart size and mediastinal contours are stable. Lungs are clear. No
pleural effusion or pneumothorax is seen. No acute-appearing osseous
abnormality. Mild degenerative spondylosis of the kyphotic thoracic
spine.
IMPRESSION: No active cardiopulmonary disease. No evidence of pneumonia or
pulmonary edema.

## 2022-08-07 ENCOUNTER — Emergency Department (HOSPITAL_COMMUNITY): Payer: Medicare Other

## 2022-08-07 ENCOUNTER — Encounter (HOSPITAL_COMMUNITY): Payer: Self-pay | Admitting: Emergency Medicine

## 2022-08-07 ENCOUNTER — Emergency Department (HOSPITAL_COMMUNITY)
Admission: EM | Admit: 2022-08-07 | Discharge: 2022-08-07 | Disposition: A | Discharge status: Home / Self Care | Payer: Medicare Other | Attending: Emergency Medicine | Admitting: Emergency Medicine

## 2022-08-07 ENCOUNTER — Other Ambulatory Visit: Payer: Self-pay

## 2022-08-07 DIAGNOSIS — W19XXXA Unspecified fall, initial encounter: Secondary | ICD-10-CM | POA: Diagnosis not present

## 2022-08-07 DIAGNOSIS — Z1152 Encounter for screening for COVID-19: Secondary | ICD-10-CM | POA: Diagnosis not present

## 2022-08-07 DIAGNOSIS — R531 Weakness: Secondary | ICD-10-CM | POA: Insufficient documentation

## 2022-08-07 DIAGNOSIS — I7121 Aneurysm of the ascending aorta, without rupture: Secondary | ICD-10-CM | POA: Diagnosis not present

## 2022-08-07 DIAGNOSIS — Z8616 Personal history of COVID-19: Secondary | ICD-10-CM | POA: Insufficient documentation

## 2022-08-07 DIAGNOSIS — R0602 Shortness of breath: Secondary | ICD-10-CM | POA: Diagnosis not present

## 2022-08-07 LAB — URINALYSIS, ROUTINE W REFLEX MICROSCOPIC
Bacteria, UA: NONE SEEN
Bilirubin Urine: NEGATIVE
Glucose, UA: NEGATIVE mg/dL
Ketones, ur: NEGATIVE mg/dL
Leukocytes,Ua: NEGATIVE
Nitrite: NEGATIVE
Protein, ur: NEGATIVE mg/dL
Specific Gravity, Urine: 1.008 (ref 1.005–1.030)
pH: 7 (ref 5.0–8.0)

## 2022-08-07 LAB — CBC WITH DIFFERENTIAL/PLATELET
Abs Immature Granulocytes: 0.01 10*3/uL (ref 0.00–0.07)
Basophils Absolute: 0.1 10*3/uL (ref 0.0–0.1)
Basophils Relative: 1 %
Eosinophils Absolute: 0.2 10*3/uL (ref 0.0–0.5)
Eosinophils Relative: 4 %
HCT: 33.1 % — ABNORMAL LOW (ref 39.0–52.0)
Hemoglobin: 10.9 g/dL — ABNORMAL LOW (ref 13.0–17.0)
Immature Granulocytes: 0 %
Lymphocytes Relative: 34 %
Lymphs Abs: 1.7 10*3/uL (ref 0.7–4.0)
MCH: 27.8 pg (ref 26.0–34.0)
MCHC: 32.9 g/dL (ref 30.0–36.0)
MCV: 84.4 fL (ref 80.0–100.0)
Monocytes Absolute: 0.4 10*3/uL (ref 0.1–1.0)
Monocytes Relative: 8 %
Neutro Abs: 2.6 10*3/uL (ref 1.7–7.7)
Neutrophils Relative %: 53 %
Platelets: 154 10*3/uL (ref 150–400)
RBC: 3.92 MIL/uL — ABNORMAL LOW (ref 4.22–5.81)
RDW: 15.7 % — ABNORMAL HIGH (ref 11.5–15.5)
WBC: 4.9 10*3/uL (ref 4.0–10.5)
nRBC: 0 % (ref 0.0–0.2)

## 2022-08-07 LAB — BASIC METABOLIC PANEL
Anion gap: 6 (ref 5–15)
BUN: 23 mg/dL (ref 8–23)
CO2: 23 mmol/L (ref 22–32)
Calcium: 8.6 mg/dL — ABNORMAL LOW (ref 8.9–10.3)
Chloride: 106 mmol/L (ref 98–111)
Creatinine, Ser: 1.99 mg/dL — ABNORMAL HIGH (ref 0.61–1.24)
GFR, Estimated: 33 mL/min — ABNORMAL LOW (ref >60)
Glucose, Bld: 93 mg/dL (ref 70–99)
Potassium: 4.2 mmol/L (ref 3.5–5.1)
Sodium: 135 mmol/L (ref 135–145)

## 2022-08-07 LAB — COMPREHENSIVE METABOLIC PANEL
ALT: 13 U/L (ref 0–44)
AST: 21 U/L (ref 15–41)
Albumin: 4.2 g/dL (ref 3.5–5.0)
Alkaline Phosphatase: 68 U/L (ref 38–126)
Anion gap: 8 (ref 5–15)
BUN: 25 mg/dL — ABNORMAL HIGH (ref 8–23)
CO2: 25 mmol/L (ref 22–32)
Calcium: 9.3 mg/dL (ref 8.9–10.3)
Chloride: 105 mmol/L (ref 98–111)
Creatinine, Ser: 2.21 mg/dL — ABNORMAL HIGH (ref 0.61–1.24)
GFR, Estimated: 29 mL/min — ABNORMAL LOW (ref >60)
Glucose, Bld: 83 mg/dL (ref 70–99)
Potassium: 3.8 mmol/L (ref 3.5–5.1)
Sodium: 138 mmol/L (ref 135–145)
Total Bilirubin: 0.7 mg/dL (ref 0.3–1.2)
Total Protein: 7.7 g/dL (ref 6.5–8.1)

## 2022-08-07 LAB — RESP PANEL BY RT-PCR (FLU A&B, COVID) ARPGX2
Influenza A by PCR: NEGATIVE
Influenza B by PCR: NEGATIVE
SARS Coronavirus 2 by RT PCR: NEGATIVE

## 2022-08-07 LAB — TROPONIN I (HIGH SENSITIVITY)
Troponin I (High Sensitivity): 8 ng/L (ref <18)
Troponin I (High Sensitivity): 8 ng/L (ref <18)

## 2022-08-07 LAB — D-DIMER, QUANTITATIVE: D-Dimer, Quant: 1.12 ug/mL-FEU — ABNORMAL HIGH (ref 0.00–0.50)

## 2022-08-07 MED ORDER — IOHEXOL 350 MG/ML SOLN
60.0000 mL | Freq: Once | INTRAVENOUS | Status: AC | PRN
  Administered 2022-08-07: 60 mL via INTRAVENOUS

## 2022-08-07 MED ORDER — SODIUM CHLORIDE 0.9 % IV BOLUS
1000.0000 mL | Freq: Once | INTRAVENOUS | Status: AC
  Administered 2022-08-07: 1000 mL via INTRAVENOUS

## 2022-08-07 MED ORDER — SODIUM CHLORIDE 0.9 % IV BOLUS
500.0000 mL | Freq: Once | INTRAVENOUS | Status: AC
Start: 2022-08-07 — End: 2022-08-07
  Administered 2022-08-07: 500 mL via INTRAVENOUS

## 2022-08-07 MED ORDER — SODIUM CHLORIDE 0.9 % IV BOLUS: 500.0000 mL | Freq: Once | INTRAVENOUS | Status: DC

## 2022-08-07 NOTE — ED Triage Notes (Signed)
Pt BIB for weakness at home with increased falls, family reports pt had a fall last night and a near fall this am, hx of bradycardia; pt reports feeling SOB, denies cough, hitting head and LOC

## 2022-08-07 NOTE — ED Provider Notes (Signed)
  Provider Note MRN:  [id number]  Arrival date & time: 08/07/22    ED Course and Medical Decision Making  Assumed care from Dr Roderic Palau at shift change.  See note from prior team for complete details, in brief:  86 yo male Fall pta Caught by family prior to hitting the ground Dib, on ambient air, no conversational dib, no hypoxia Gait steady, ambulatory w/o dib Family to stay with him the next few days  Plan per prior physician CTPE  If CTPE neg then he can go home  CT PE reviewed, does show thoracic aneurysm w/o rupture, no PE, mild cardiomegaly, ?tooth decay, other chronic changes >> no hx of this per pt, do not see hx of aneurysm in chart, will give him f/u with CTS and advised him to seek rpt imaging in 1 year for surveillance either through PCP or CTS  Pt feeling at his baseline, no resp distress, hds, family at bedside  Discussed results from today and plan going forward   Plan to discharge pt home per plan of prior team  The patient improved significantly and was discharged in stable condition. Detailed discussions were had with the patient regarding current findings, and need for close f/u with PCP or on call doctor. The patient has been instructed to return immediately if the symptoms worsen in any way for re-evaluation. Patient verbalized understanding and is in agreement with current care plan. All questions answered prior to discharge.     Clinical Course as of 08/07/22 1656  Wed Aug 07, 2022  1647 IMPRESSION: 1. No filling defect is identified in the pulmonary arterial tree to suggest pulmonary embolus. 2. Prominent main pulmonary artery raising the possibility of pulmonary arterial hypertension. 3. Ascending thoracic aortic aneurysm 4.2 cm in diameter. Recommend annual imaging followup by CTA or MRA. This recommendation follows 2010 ACCF/AHA/AATS/ACR/ASA/SCA/SCAI/SIR/STS/SVM Guidelines for the Diagnosis and Management of Patients with Thoracic Aortic  Disease. Circulation. 2010; 121: P794-I016. Aortic aneurysm NOS (ICD10-I71.9) 4. Mild cardiomegaly. 5. Mildly elevated left hemidiaphragm. 6. Linear subsegmental atelectasis or scarring in both lower lobes. 7. Top most images suggests tooth decay in the mandibular canines and left premolar. 8. Thoracic spondylosis. 9. Aortic atherosclerosis.   [SG]    Clinical Course User Index [SG] Jeanell Sparrow, DO    Procedures  Final Clinical Impressions(s) / ED Diagnoses     ICD-10-CM   1. Weakness  R53.1     2. Aneurysm of ascending aorta without rupture [facility])  I71.21       ED Discharge Orders     None         Discharge Instructions      Please follow up with cardiothoracic surgery in regards to your aortic aneurysm, will need repeat CT in one year to monitor  It was a pleasure caring for you today in the emergency department.  Please return to the emergency department for any worsening or worrisome symptoms.           Jeanell Sparrow, DO 08/07/22 1657

## 2022-08-07 NOTE — ED Provider Notes (Signed)
[facility] EMERGENCY DEPARTMENT Provider Note   CSN: [id number] Arrival date & time: 08/07/22  1107     History  Chief Complaint  Patient presents with   Weakness    Nathan [name] is a 86 y.o. male.  Patient has history of COPD and hypertension.  He had a fall couple days ago and then had a near fall again today.  He was brought in by his son for weakness and some shortness of breath  The history is provided by the patient and medical records.  Weakness Severity:  Mild Onset quality:  Sudden Timing:  Intermittent Progression:  Waxing and waning Chronicity:  New Relieved by:  Nothing Worsened by:  Nothing Ineffective treatments:  None tried Associated symptoms: no abdominal pain, no chest pain, no cough, no diarrhea, no frequency, no headaches and no seizures        Home Medications Prior to Admission medications   Medication Sig Start Date End Date Taking? Authorizing Provider  albuterol (VENTOLIN HFA) 108 (90 Base) MCG/ACT inhaler Inhale 2 puffs into the lungs every 6 (six) hours as needed for wheezing or shortness of breath. 08/28/21  Yes Shah, Pratik D, DO  allopurinol (ZYLOPRIM) 100 MG tablet Take 200 mg by mouth daily. 08/13/21  Yes [provider]  aspirin EC 81 MG tablet Take 81 mg by mouth daily.   Yes [provider]  cholecalciferol (VITAMIN D) 1000 units tablet Take 1,000 Units by mouth 3 (three) times a week.    Yes [provider]  guaiFENesin-dextromethorphan (ROBITUSSIN DM) 100-10 MG/5ML syrup Take 10 mLs by mouth every 4 (four) hours as needed for cough. 08/28/21  Yes Shah, Pratik D, DO  lisinopril (ZESTRIL) 5 MG tablet Take 5 mg by mouth daily.   Yes [provider]  simvastatin (ZOCOR) 20 MG tablet TAKE 1 TABLET BY MOUTH EVERYDAY AT BEDTIME 12/17/21  Yes Strader, [place] M, PA-C  sodium bicarbonate 650 MG tablet Take 650-1,300 mg by mouth 2 (two) times daily. Takes 2 tabs in the morning and 1 in the evening   Yes  [provider]  amLODipine (NORVASC) 5 MG tablet Take 5 mg by mouth daily. Patient not taking: Reported on 08/07/2022 04/07/20   [provider]      Allergies    Patient has no known allergies.    Review of Systems   Review of Systems  Constitutional:  Negative for appetite change and fatigue.  HENT:  Negative for congestion, ear discharge and sinus pressure.   Eyes:  Negative for discharge.  Respiratory:  Negative for cough.   Cardiovascular:  Negative for chest pain.  Gastrointestinal:  Negative for abdominal pain and diarrhea.  Genitourinary:  Negative for frequency and hematuria.  Musculoskeletal:  Negative for back pain.  Skin:  Negative for rash.  Neurological:  Positive for weakness. Negative for seizures and headaches.  Psychiatric/Behavioral:  Negative for hallucinations.     Physical Exam Updated Vital Signs BP (!) 146/88   Pulse 66   Temp 98.1 F (36.7 C) (Oral)   Resp (!) 24   Ht '5\' 9"'$  (1.753 m)   Wt 68 kg   SpO2 100%   BMI 22.15 kg/m  Physical Exam Vitals and nursing note reviewed.  Constitutional:      Appearance: He is well-developed.  HENT:     Head: Normocephalic.  Eyes:     General: No scleral icterus.    Conjunctiva/sclera: Conjunctivae normal.  Neck:     Thyroid: No  thyromegaly.  Cardiovascular:     Rate and Rhythm: Normal rate and regular rhythm.     Heart sounds: No murmur heard.    No friction rub. No gallop.  Pulmonary:     Breath sounds: No stridor. No wheezing or rales.  Chest:     Chest wall: No tenderness.  Abdominal:     General: There is no distension.     Tenderness: There is no abdominal tenderness. There is no rebound.  Musculoskeletal:        General: Normal range of motion.     Cervical back: Neck supple.  Lymphadenopathy:     Cervical: No cervical adenopathy.  Skin:    Findings: No erythema or rash.  Neurological:     Mental Status: He is alert and oriented to person, place, and time.     Motor:  No abnormal muscle tone.     Coordination: Coordination normal.  Psychiatric:        Behavior: Behavior normal.     ED Results / Procedures / Treatments   Labs (all labs ordered are listed, but only abnormal results are displayed) Labs Reviewed  CBC WITH DIFFERENTIAL/PLATELET - Abnormal; Notable for the following components:      Result Value   RBC 3.92 (*)    Hemoglobin 10.9 (*)    HCT 33.1 (*)    RDW 15.7 (*)    All other components within normal limits  COMPREHENSIVE METABOLIC PANEL - Abnormal; Notable for the following components:   BUN 25 (*)    Creatinine, Ser 2.21 (*)    GFR, Estimated 29 (*)    All other components within normal limits  D-DIMER, QUANTITATIVE - Abnormal; Notable for the following components:   D-Dimer, Quant 1.12 (*)    All other components within normal limits  URINALYSIS, ROUTINE W REFLEX MICROSCOPIC - Abnormal; Notable for the following components:   Color, Urine STRAW (*)    Hgb urine dipstick SMALL (*)    All other components within normal limits  BASIC METABOLIC PANEL - Abnormal; Notable for the following components:   Creatinine, Ser 1.99 (*)    Calcium 8.6 (*)    GFR, Estimated 33 (*)    All other components within normal limits  RESP PANEL BY RT-PCR (FLU A&B, COVID) [id number]  TROPONIN I (HIGH SENSITIVITY)  TROPONIN I (HIGH SENSITIVITY)    EKG EKG Interpretation  Date/Time:  Wednesday August 07 2022 11:22:41 EST Ventricular Rate:  57 PR Interval:    QRS Duration: 111 QT Interval:  499 QTC Calculation: 486 R Axis:   37 Text Interpretation: Atrial fibrillation Abnormal R-wave progression, early transition Minimal ST depression, anterolateral leads Borderline prolonged QT interval Confirmed by Milton Ferguson [phone]) on 08/07/2022 11:33:07 AM  Radiology CT Head Wo Contrast  Result Date: 08/07/2022 CLINICAL DATA:  Head trauma. Intracranial arterial injury suspected. Weakness at home with increased falls. Fall last night and near fall  this morning. EXAM: CT HEAD WITHOUT CONTRAST TECHNIQUE: Contiguous axial images were obtained from the base of the skull through the vertex without intravenous contrast. RADIATION DOSE REDUCTION: This exam was performed according to the departmental dose-optimization program which includes automated exposure control, adjustment of the mA and/or kV according to patient size and/or use of iterative reconstruction technique. COMPARISON:  CT brain 08/26/2021 FINDINGS: Brain: The ventricles are normal in size and configuration. The basilar cisterns are patent. There is again prominence of the anterior right frontal extra-axial CSF space. This may represent a chronic hygroma, measuring  up to 1.7 cm in transverse dimension as measured on coronal images, unchanged from prior. It also could represent asymmetric brain atrophy given some crossing cortical veins. The pituitary gland is again enlarged, measuring up to approximately 1.9 x 1.7 x 2.7 cm (transverse by AP by craniocaudal), not significant changed from 08/26/2021 prior CT. No acute intracranial hemorrhage is seen. Mild senescent calcifications within the left-greater-than-right basal ganglia are unchanged from prior. Preservation of the normal cortical gray-white interface without CT evidence of an acute major vascular territorial cortical based infarction. Vascular: No hyperdense vessel or unexpected calcification. Skull: Normal. Negative for fracture or focal lesion. Sinuses/Orbits: Status post bilateral ocular lens replacements. The visualized paranasal sinuses and mastoid air cells are clear. Resolution of the prior high-grade left and mild right maxillary sinus and moderate to high-grade ethmoid air cell mucosal opacification. Other: None. IMPRESSION: 1. No acute intracranial abnormality. 2. Unchanged prominence of the anterior right frontal extra-axial CSF space, which may represent a chronic hygroma or asymmetric brain atrophy. 3. Unchanged enlargement of the  pituitary gland. This demonstrates stability over the past 11 months. Previously MRI sella protocol was recommended for further evaluation on 08/26/2021 head CT. Electronically Signed   By: Yvonne Kendall M.D.   On: 08/07/2022 13:04   DG Chest Port 1 View  Result Date: 08/07/2022 CLINICAL DATA:  Fall and shortness of breath EXAM: PORTABLE CHEST 1 VIEW COMPARISON:  Chest radiograph 08/26/2021 FINDINGS: The heart is mildly enlarged, unchanged. The upper mediastinal contours are normal. There is no focal consolidation or pulmonary edema. There is no pleural effusion or pneumothorax There is no acute osseous abnormality. IMPRESSION: Unchanged borderline cardiomegaly. No radiographic evidence of acute cardiopulmonary process. Electronically Signed   By: Valetta Mole M.D.   On: 08/07/2022 11:52    Procedures Procedures    Medications Ordered in ED Medications  sodium chloride 0.9 % bolus 500 mL (0 mLs Intravenous Stopped 08/07/22 1341)  sodium chloride 0.9 % bolus 1,000 mL (0 mLs Intravenous Stopped 08/07/22 1436)  iohexol (OMNIPAQUE) 350 MG/ML injection 60 mL (60 mLs Intravenous Contrast Given 08/07/22 1540)    ED Course/ Medical Decision Making/ A&P Clinical Course as of 08/13/22 1236  Wed Aug 07, 2022  1647 IMPRESSION: 1. No filling defect is identified in the pulmonary arterial tree to suggest pulmonary embolus. 2. Prominent main pulmonary artery raising the possibility of pulmonary arterial hypertension. 3. Ascending thoracic aortic aneurysm 4.2 cm in diameter. Recommend annual imaging followup by CTA or MRA. This recommendation follows 2010 ACCF/AHA/AATS/ACR/ASA/SCA/SCAI/SIR/STS/SVM Guidelines for the Diagnosis and Management of Patients with Thoracic Aortic Disease. Circulation. 2010; 121: R416-L845. Aortic aneurysm NOS (ICD10-I71.9) 4. Mild cardiomegaly. 5. Mildly elevated left hemidiaphragm. 6. Linear subsegmental atelectasis or scarring in both lower lobes. 7. Top most images  suggests tooth decay in the mandibular canines and left premolar. 8. Thoracic spondylosis. 9. Aortic atherosclerosis.   [SG]    Clinical Course User Index [SG] Wynona Dove A, DO  Patient with mild dehydration and weakness.  Patient has improved with saline.  Patient will get a CT scan of his chest to rule out PE.  If that is negative he will be discharged home for follow-up with PCP.  The family states that they will stay with him over the weekend                         Medical Decision Making Amount and/or Complexity of Data Reviewed Labs: ordered. Radiology: ordered. ECG/medicine tests: ordered.  Risk Prescription drug management.   Weakness and falls.    Disposition will be determined by my colleague Dr. Pearline Cables after the CT scan        Final Clinical Impression(s) / ED Diagnoses Final diagnoses:  Weakness    Rx / DC Orders ED Discharge Orders     None         Milton Ferguson, MD 08/13/22 1237

## 2022-08-07 NOTE — Discharge Instructions (Addendum)
Please follow up with cardiothoracic surgery in regards to your aortic aneurysm, will need repeat CT in one year to monitor  It was a pleasure caring for you today in the emergency department.  Please return to the emergency department for any worsening or worrisome symptoms.

## 2022-08-20 ENCOUNTER — Ambulatory Visit: Payer: Medicare Other | Admitting: Podiatry

## 2022-08-20 ENCOUNTER — Encounter: Payer: Self-pay | Admitting: Podiatry

## 2022-08-20 VITALS — BP 172/62 | HR 57 | Temp 97.5°F

## 2022-08-20 DIAGNOSIS — B353 Tinea pedis: Secondary | ICD-10-CM

## 2022-08-20 DIAGNOSIS — L84 Corns and callosities: Secondary | ICD-10-CM

## 2022-08-20 DIAGNOSIS — I739 Peripheral vascular disease, unspecified: Secondary | ICD-10-CM

## 2022-08-20 DIAGNOSIS — M79675 Pain in left toe(s): Secondary | ICD-10-CM

## 2022-08-20 DIAGNOSIS — M79674 Pain in right toe(s): Secondary | ICD-10-CM

## 2022-08-20 DIAGNOSIS — B351 Tinea unguium: Secondary | ICD-10-CM

## 2022-08-20 DIAGNOSIS — L853 Xerosis cutis: Secondary | ICD-10-CM

## 2022-08-20 MED ORDER — KETOCONAZOLE 2 % EX CREA: TOPICAL_CREAM | CUTANEOUS | 1 refills | Status: DC

## 2022-08-20 NOTE — Progress Notes (Addendum)
Subjective: Nathan [name] presents today referred by Carrolyn Meiers, MD for complaint of with chief concern of elongated, thickened, painful, discolored toenails for months. Patient has tried none.  Patient is accompanied by his son on today's visit. Chief Complaint  Patient presents with   Nail Problem    Routine foot care PCP-[place] PCP VST-Last week   Past Medical History:  Diagnosis Date   Anemia, normocytic normochromic    H&H of 12.3/35.5 in 01/2012   Auditory impairment    OS   Bradycardia    Evaluated by Dr. Cletus Gash in 2009   Carcinoma of prostate [facility]) 2004   Gout    Last symptomatic episode in 2006   Hyperlipidemia    Lipid profile in 01/2012:203, 475, 37, X   Hypertension    Systolic murmur    Consistent with mitral regurgitation   Tobacco abuse    0.5 pack per day; 30 pack years     Patient Active Problem List   Diagnosis Date Noted   Bradycardia with 41-50 beats per minute 08/26/2021   COVID-19 virus infection 08/26/2021   Gouty arthropathy 08/04/2019   Proteinuria 08/04/2019   Vitamin D deficiency 12/21/2012   Chronic kidney disease 02/23/2012   Fasting hyperglycemia 02/23/2012   Anemia, normocytic normochromic    Systolic murmur    Bradycardia    Hypertension    Carcinoma of prostate ([place])    Tobacco abuse    Gout 02/19/2011     Past Surgical History:  Procedure Laterality Date   bone spur Right    elbow   CATARACT EXTRACTION W/PHACO Left 07/11/2017   Procedure: CATARACT EXTRACTION PHACO AND INTRAOCULAR LENS PLACEMENT LEFT EYE;  Surgeon: Baruch Goldmann, MD;  Location: [facility];  Service: Ophthalmology;  Laterality: Left;  CDE: 7.35   CATARACT EXTRACTION W/PHACO Right 07/25/2017   Procedure: CATARACT EXTRACTION PHACO AND INTRAOCULAR LENS PLACEMENT RIGHT EYE CDE=5.32;  Surgeon: Baruch Goldmann, MD;  Location: [facility] ORS;  Service: Ophthalmology;  Laterality: Right;  right   COLONOSCOPY  10/02/2012   Procedure: COLONOSCOPY;  Surgeon: Danie Binder, MD;  Location: [facility] ENDO SUITE;  Service: Endoscopy;  Laterality: N/A;  10:30 AM   PROSTATE SURGERY       Current Outpatient Medications on File Prior to Visit  Medication Sig Dispense Refill   allopurinol (ZYLOPRIM) 100 MG tablet Take 200 mg by mouth daily.     aspirin EC 81 MG tablet Take 81 mg by mouth daily.     cholecalciferol (VITAMIN D) 1000 units tablet Take 1,000 Units by mouth 3 (three) times a week.      lisinopril (ZESTRIL) 5 MG tablet Take 5 mg by mouth daily.     simvastatin (ZOCOR) 20 MG tablet TAKE 1 TABLET BY MOUTH EVERYDAY AT BEDTIME 90 tablet 3   sodium bicarbonate 650 MG tablet Take 650-1,300 mg by mouth 2 (two) times daily. Takes 2 tabs in the morning and 1 in the evening     No current facility-administered medications on file prior to visit.     No Known Allergies   Social History   Occupational History   Occupation: Retired    Fish farm manager: [employer]  Tobacco Use   Smoking status: Every Day    Packs/day: 0.50    Years: 55.00    Total pack years: 27.50    Types: Cigarettes    Start date: 12/29/1957   Smokeless tobacco: Never  Vaping Use   Vaping Use: Never used  Substance and Sexual  Activity   Alcohol use: Not Currently    Alcohol/week: 4.0 standard drinks of alcohol    Types: 4 Standard drinks or equivalent per week   Drug use: No   Sexual activity: Not Currently    Birth control/protection: None     Family History  Problem Relation Age of Onset   Heart disease Mother    Arrhythmia Mother        brother(brady)   Coronary artery disease Neg Hx    Colon cancer Neg Hx      There is no immunization history on file for this patient.   Objective: Vitals:   08/20/22 1338  BP: (!) 172/62  Pulse: (!) 57  Temp: (!) 97.5 F (36.4 C)    Nathan [name] is a pleasant 86 y.o. male WD, WN in NAD. AAO x 3.  Vascular Examination: Vascular status intact b/l with faintly palpable DP pulses. PT pulses diminished b/l. Pedal hair absent  b/l. CFT <3 seconds b/l. No edema. No pain with calf compression b/l. Skin temperature gradient WNL b/l.   Neurological Examination: Sensation grossly intact b/l with 10 gram monofilament. Vibratory sensation intact b/l.   Dermatological Examination: Pedal skin is thin and atrophic b/l.  Toenails 1-5 b/l thick, discolored, elongated with subungual debris and pain on dorsal palpation. Hyperkeratotic lesion(s) submet head 1 b/l and submet head 5 left foot.  No erythema, no edema, no drainage, no fluctuance. Diffuse scaling noted peripherally and plantarly b/l feet.  No interdigital macerations.  No blisters, no weeping. No signs of secondary bacterial infection noted.  Musculoskeletal Examination: Normal muscle strength 5/5 to all lower extremity muscle groups bilaterally. Hammertoe deformity noted 2-5 b/l.Marland Kitchen No pain, crepitus or joint limitation noted with ROM b/l LE.  Patient ambulates independently without assistive aids. Plantar fat pad atrophy of forefoot area b/l lower extremities.  Radiographs: None  Assessment: 1. Pain due to onychomycosis of toenails of both feet   2. Callus   3. Tinea pedis of both feet   4. Xerosis cutis   5. PAD (peripheral artery disease) ([place])     Plan: -Patient was evaluated and treated. All patient's and/or POA's questions/concerns answered on today's visit. -Patient's family member present. All questions/concerns addressed on today's visit. -Toenails 1-5 b/l were debrided in length and girth with sterile nail nippers and dremel without iatrogenic bleeding.  -Callus(es) submet head 1 b/l and submet head 5 left foot pared utilizing sterile scalpel blade without complication or incident. Total number debrided =3. -For dry skin, patient was given written list of OTC moisturizers. Patient/POA instructed to apply to foot/feet once daily avoiding application between toes.  -For tinea pedis, Rx sent to pharmacy for Ketoconazole Cream 2% to be applied once daily for  six weeks. -Shoe recommendations given for [place]. -Patient/POA to call should there be question/concern in the interim.  Return in about 3 months (around 11/20/2022).  Marzetta Board, DPM

## 2022-08-20 NOTE — Patient Instructions (Addendum)
Recommend Skechers Loafers with stretchable uppers and memory foam insoles. They can be purchased at [employer]. Also on [email or web address]. Avoid leather shoes which could rub a sore on your hammertoes.  Choose a moisturizer from  the list below: Moisturize feet once daily; do not apply between toes A.  CeraVe Daily Moisturizing Lotion B.  Lubriderm Advanced Therapy Lotion or Lubriderm Intense Skin Repair Lotion C.  Vaseline Intensive Care Lotion D.  Gold Bond Ultimate Diabetic Foot Lotion E.  Eucerin Intensive Repair Moisturizing Lotion  Athlete's Foot Athlete's foot (tinea pedis) is a fungal infection of the skin on your feet. It often occurs on the skin that is between or underneath the toes. It can also occur on the soles of your feet. The infection can spread from person to person (is contagious). It can also spread when a person's bare feet come in contact with the fungus on shower floors or on items such as shoes. What are the causes? This condition is caused by a fungus that grows in warm, moist places. You can get athlete's foot by sharing shoes, shower stalls, towels, and wet floors with someone who is infected. Not washing your feet or changing your socks often enough can also lead to athlete's foot. What increases the risk? This condition is more likely to develop in: Men. People who have a weak body defense system (immune system). People who have diabetes. People who use public showers, such as at a gym. People who wear heavy-duty shoes, such as Environmental manager. Seasons with warm, humid weather. What are the signs or symptoms? Symptoms of this condition include: Itchy areas between your toes or on the soles of your feet. White, flaky, or scaly areas between your toes or on the soles of your feet. Very itchy small blisters between your toes or on the soles of your feet. Small cuts in your skin. These cuts can become infected. Thick or discolored toenails. How is  this diagnosed? This condition may be diagnosed with a physical exam and a review of your medical history. Your health care provider may also take a skin or toenail sample to examine under a microscope. How is this treated? This condition is treated with antifungal medicines. These may be applied as powders, ointments, or creams. In severe cases, an oral antifungal medicine may be given. Follow these instructions at home: Medicines Apply or take over-the-counter and prescription medicines only as told by your health care provider. Apply your antifungal medicine as told by your health care provider. Do not stop using the antifungal even if your condition improves. Foot care Do not scratch your feet. Keep your feet dry: Wear cotton or wool socks. Change your socks every day or if they become wet. Wear shoes that allow air to flow, such as sandals or canvas tennis shoes. Wash and dry your feet, including the area between your toes. Also, wash and dry your feet: Every day or as told by your health care provider. After exercising. General instructions Do not let others use towels, shoes, nail clippers, or other personal items that touch your feet. Protect your feet by wearing sandals in wet areas, such as locker rooms and shared showers. Keep all follow-up visits. This is important. If you have diabetes, keep your blood sugar under control. Contact a health care provider if: You have a fever. You have swelling, soreness, warmth, or redness in your foot. Your feet are not getting better with treatment. Your symptoms get worse. You have new symptoms.  You have severe pain. Summary Athlete's foot (tinea pedis) is a fungal infection of the skin on your feet. It often occurs on skin that is between or underneath the toes. This condition is caused by a fungus that grows in warm, moist places. Symptoms include white, flaky, or scaly areas between your toes or on the soles of your feet. This  condition is treated with antifungal medicines. Keep your feet clean. Always dry them thoroughly. This information is not intended to replace advice given to you by your health care provider. Make sure you discuss any questions you have with your health care provider. Document Revised: 12/31/2020 Document Reviewed: 12/31/2020 Elsevier Patient Education  [place] Toe Hammer toe is a change in the shape, or a deformity, of the toe. The deformity causes the middle joint of the toe to stay bent. Hammer toe starts gradually. At first, the toe can be straightened. Then over time, the toe deformity becomes stiff, inflexible, and permanently bent. Hammer toe usually affects the second, third, or fourth toe. A hammer toe causes pain, especially when wearing shoes. Corns and calluses can result from the toe rubbing against the inside of the shoe. Early treatments to keep the toe straight may relieve pain. As the deformity of the toe becomes stiff and permanent, surgery may be needed to straighten the toe. What are the causes? This condition is caused by abnormal bending of the toe joint that is closest to your foot. Over time, the toe bending downward pulls on the muscles and connections (tendons) of the toe joint, making them weak and stiff. Wearing shoes that are too narrow in the toe box and do not allow toes to fully straighten can cause this condition. What increases the risk? You are more likely to develop this condition if you: Are an older male. Wear shoes that are too small, or wear high-heeled shoes that pinch your toes. Have a second toe that is longer than your big toe (first toe). Injure your foot or toe. Have arthritis, or have a nerve or muscle disorder. Have diabetes or a condition known as Charcot joint, which may cause you to walk abnormally. Have a family history of hammer toe. Are a Engineer, mining. What are the signs or symptoms? Pain and deformity of the toe are  the main symptoms of this condition. The pain is worse when wearing shoes, walking, or running. Other symptoms may include: A thickened patch of skin, called a corn or callus, that forms over the top of the bent part of the toe or between the toes. Redness and a burning feeling on the bent toe. An open sore that forms on the top of the bent toe. Not being able to straighten the affected toe. How is this diagnosed? This condition is diagnosed based on your symptoms and a physical exam. During the exam, your health care provider will try to straighten your toe to see how stiff the deformity is. You may also have tests, such as: A blood test to check for rheumatoid arthritis or diabetes. An X-ray to show how severe the toe deformity is. How is this treated? Treatment for this condition depends on whether the toe is flexible or deformed and no longer moveable. In less severe cases, a hammer toe can be straightened without surgery. These treatments include: Taping the toe into a straightened position. Using pads and cushions to protect the bent toe. Wearing shoes that provide enough room for the toes. Doing toe-stretching exercises  at home. Taking an NSAID, such as ibuprofen, to reduce pain and swelling. Using special orthotics or insoles for pain relief and to improve walking. If these treatments do not help or the toe has a severe deformity and cannot be straightened, surgery is the next option. The most common surgeries used to straighten a hammer toe include: Arthroplasty or osteotomy. Part of the toe joint is reconstructed or removed, which allows the toe to straighten. Fusion. Cartilage between the two bones of the joint is taken out, and the bones are fused together into one longer bone. Implantation. Part of the bone is removed and replaced with an implant to allow the toe to move again. Flexor tendon transfer. The tendons that curl the toes down (flexor tendons) are repositioned. Follow  these instructions at home: Take over-the-counter and prescription medicines only as told by your health care provider. Do toe-straightening and stretching exercises as told by your health care provider. Keep all follow-up visits. This is important. How is this prevented? Wear shoes that fit properly and give your toes enough room. Shoes should not cause pain. Buy shoes at the end of the day to make sure they fit well, since your foot may swell during the day. Make sure they are comfortable before you buy them. As you age, your shoe size might change, including the width. Measure both feet and buy shoes for the larger foot. A shoe repair store might be able to stretch shoes that feel tight in spots. Do not wear high-heeled shoes or shoes with pointed toes. Contact a health care provider if: Your pain gets worse. Your toe becomes red or swollen. You develop an open sore on your toe. Summary Hammer toe is a condition that gradually causes your toe to become bent and stiff. Hammer toe can be treated by taping the toe into a straightened position and doing toe-stretching exercises. If these treatments do not help, surgery may be needed. To prevent this condition, wear shoes that fit properly, give your toes enough room, and do not cause pain. This information is not intended to replace advice given to you by your health care provider. Make sure you discuss any questions you have with your health care provider. Document Revised: 12/16/2019 Document Reviewed: 12/16/2019 Elsevier Patient Education  [place] are small areas of thickened skin that form on the top, sides, or tip of a toe. Corns have a cone-shaped core with a point that can press on a nerve below. This causes pain. Calluses are areas of thickened skin that can form anywhere on the body, including the hands, fingers, palms, soles of the feet, and heels. Calluses are usually larger than corns. What  are the causes? Corns and calluses are caused by rubbing (friction) or pressure, such as from shoes that are too tight or do not fit properly. What increases the risk? Corns are more likely to develop in people who have misshapen toes (toe deformities), such as hammer toes. Calluses can form with friction to any area of the skin. They are more likely to develop in people who: Work with their hands. Wear shoes that fit poorly, are too tight, or are high-heeled. Have toe deformities. What are the signs or symptoms? Symptoms of a corn or callus include: A hard growth on the skin. Pain or tenderness under the skin. Redness and swelling. Increased discomfort while wearing tight-fitting shoes, if your feet are affected. If a corn or callus becomes infected, symptoms may  include: Redness and swelling that gets worse. Pain. Fluid, blood, or pus draining from the corn or callus. How is this diagnosed? Corns and calluses may be diagnosed based on your symptoms, your medical history, and a physical exam. How is this treated? Treatment for corns and calluses may include: Removing the cause of the friction or pressure. This may involve: Changing your shoes. Wearing shoe inserts (orthotics) or other protective layers in your shoes, such as a corn pad. Wearing gloves. Applying medicine to the skin (topical medicine) to help soften skin in the hardened, thickened areas. Removing layers of dead skin with a file to reduce the size of the corn or callus. Removing the corn or callus with a scalpel or laser. Taking antibiotic medicines, if your corn or callus is infected. Having surgery, if a toe deformity is the cause. Follow these instructions at home:  Take over-the-counter and prescription medicines only as told by your health care provider. If you were prescribed an antibiotic medicine, take it as told by your health care provider. Do not stop taking it even if your condition improves. Wear shoes  that fit well. Avoid wearing high-heeled shoes and shoes that are too tight or too loose. Wear any padding, protective layers, gloves, or orthotics as told by your health care provider. Soak your hands or feet. Then use a file or pumice stone to soften your corn or callus. Do this as told by your health care provider. Check your corn or callus every day for signs of infection. Contact a health care provider if: Your symptoms do not improve with treatment. You have redness or swelling that gets worse. Your corn or callus becomes painful. You have fluid, blood, or pus coming from your corn or callus. You have new symptoms. Get help right away if: You develop severe pain with redness. Summary Corns are small areas of thickened skin that form on the top, sides, or tip of a toe. These can be painful. Calluses are areas of thickened skin that can form anywhere on the body, including the hands, fingers, palms, and soles of the feet. Calluses are usually larger than corns. Corns and calluses are caused by rubbing (friction) or pressure, such as from shoes that are too tight or do not fit properly. Treatment may include wearing padding, protective layers, gloves, or orthotics as told by your health care provider. This information is not intended to replace advice given to you by your health care provider. Make sure you discuss any questions you have with your health care provider. Document Revised: 01/06/2020 Document Reviewed: 01/06/2020 Elsevier Patient Education  [place].

## 2022-08-21 NOTE — Progress Notes (Signed)
[address]       [place] [address]             [phone]   PCP is Fanta, Normajean Baxter, MD Referring Provider is Carrolyn Meiers*  Chief Complaint: Ascending thoracic aortic aneurysm   HPI: This is an 86 year old male with a past medical history of hypertension, hyperlipidemia, tobacco abuse, gout, prostate carcinoma and anemia who presented to the ED with complaints of weakness. On CT Angiography of the chest without contrast, he was found to have a 4.2 cm ascending thoracic aortic aneurysm.  He is accompanied by his [place] daughter.  He denies chest pain or pressure, he denies back pain, shortness of breath, or LE edema.  He denies family history of Aneurysm.  He does smoke a little bit.  Past Medical History:  Diagnosis Date   Anemia, normocytic normochromic    H&H of 12.3/35.5 in 01/2012   Auditory impairment    OS   Bradycardia    Evaluated by Dr. Cletus Gash in 2009   Carcinoma of prostate [facility]) 2004   Gout    Last symptomatic episode in 2006   Hyperlipidemia    Lipid profile in 01/2012:203, 475, 37, X   Hypertension    Systolic murmur    Consistent with mitral regurgitation   Tobacco abuse    0.5 pack per day; 30 pack years   Past Surgical History:  Procedure Laterality Date   bone spur Right    elbow   CATARACT EXTRACTION W/PHACO Left 07/11/2017   Procedure: CATARACT EXTRACTION PHACO AND INTRAOCULAR LENS PLACEMENT LEFT EYE;  Surgeon: Baruch Goldmann, MD;  Location: [facility];  Service: Ophthalmology;  Laterality: Left;  CDE: 7.35   CATARACT EXTRACTION W/PHACO Right 07/25/2017   Procedure: CATARACT EXTRACTION PHACO AND INTRAOCULAR LENS PLACEMENT RIGHT EYE CDE=5.32;  Surgeon: Baruch Goldmann, MD;  Location: [facility];  Service: Ophthalmology;  Laterality: Right;  right   COLONOSCOPY  10/02/2012   Procedure: COLONOSCOPY;  Surgeon: Danie Binder, MD;  Location: [facility];  Service: Endoscopy;  Laterality: N/A;  10:30 AM   PROSTATE SURGERY       Family History  Problem Relation Age of Onset   Heart disease Mother    Arrhythmia Mother        brother(brady)   Coronary artery disease Neg Hx    Colon cancer Neg Hx     Social History Social History   Tobacco Use   Smoking status: Every Day    Packs/day: 0.50    Years: 55.00    Total pack years: 27.50    Types: Cigarettes    Start date: 12/29/1957   Smokeless tobacco: Never  Vaping Use   Vaping Use: Never used  Substance Use Topics   Alcohol use: Not Currently    Alcohol/week: 4.0 standard drinks of alcohol    Types: 4 Standard drinks or equivalent per week   Drug use: No    Current Outpatient Medications  Medication Sig Dispense Refill   allopurinol (ZYLOPRIM) 100 MG tablet Take 200 mg by mouth daily.     aspirin EC 81 MG tablet Take 81 mg by mouth daily.     cholecalciferol (VITAMIN D) 1000 units tablet Take 1,000 Units by mouth 3 (three) times a week.      ketoconazole (NIZORAL) 2 % cream Apply to both feet and between toes once daily for 6 weeks. 60 g 1   lisinopril (ZESTRIL) 5 MG tablet  Take 5 mg by mouth daily.     simvastatin (ZOCOR) 20 MG tablet TAKE 1 TABLET BY MOUTH EVERYDAY AT BEDTIME 90 tablet 3   sodium bicarbonate 650 MG tablet Take 650-1,300 mg by mouth 2 (two) times daily. Takes 2 tabs in the morning and 1 in the evening     Allergies: No Known Allergies  Review of Systems:  Chest Pain Aqua.Slicker  ] Resting SOB Aqua.Slicker ] Exertional SOB [  ]  Pedal Edema Aqua.Slicker  ] Syncope [  ] Presyncope [  ]  General Review of Systems: [Y] = yes [ N]=no  Consitutional:   nausea Aqua.Slicker ];  fever [ N];  Eye : blurred vision '[ ]'$ ; Amaurosis fugax[  ];  Resp: cough [N ];  hemoptysis'[ ]'$ ;  GI: vomiting'[ ]'$ ; melena'[ ]'$ ; hematochezia '[]'$ ;  GU:hematuria'[ ]'$ ; Musculoskeletal: myalgias'[ ]'$ ; joint swelling[  ]; joint erythema'[ ]'$ ;  Heme/Lymph: anemia'[ ]'$ ;  Neuro: TIA'[ ]'$ ;stroke'[ ]'$ ;  seizures'[ ]'$ ;  Endocrine: diabetes[N ];   BP 92/60 (BP Location: Left Arm, Patient Position: Sitting)   Pulse 74    Resp 20   Ht '5\' 9"'$  (1.753 m)   Wt 142 lb (64.4 kg)   SpO2 98% Comment: RA  BMI 20.97 kg/m   Physical Exam: CV- RRR Pulmonary- CTA bilaterally Neck-No Carotid Bruit Abdomen-soft non-tender, non-distended Extremities-no edema Neurologic-grossly intact  Diagnostic Tests:  Narrative & Impression  CLINICAL DATA:  Weakness, falls, bradycardia, shortness of breath.   EXAM: CT ANGIOGRAPHY CHEST WITH CONTRAST   TECHNIQUE: Multidetector CT imaging of the chest was performed using the standard protocol during bolus administration of intravenous contrast. Multiplanar CT image reconstructions and MIPs were obtained to evaluate the vascular anatomy.   RADIATION DOSE REDUCTION: This exam was performed according to the departmental dose-optimization program which includes automated exposure control, adjustment of the mA and/or kV according to patient size and/or use of iterative reconstruction technique.   CONTRAST:  63m OMNIPAQUE IOHEXOL 350 MG/ML SOLN   COMPARISON:  Chest radiograph 08/07/2022   FINDINGS: Cardiovascular: No filling defect is identified in the pulmonary arterial tree to suggest pulmonary embolus. Atherosclerotic calcification of the thoracic aorta. Ascending thoracic aortic aneurysm 4.2 cm in diameter. Prominent main pulmonary artery raising the possibility of pulmonary arterial hypertension. Mild cardiomegaly.   Mediastinum/Nodes: Small mediastinal lymph nodes are not pathologically enlarged by size criteria.   Lungs/Pleura: Linear subsegmental atelectasis or scarring in both lower lobes.   Upper Abdomen: Unremarkable   Musculoskeletal: Mildly elevated left hemidiaphragm. Thoracic spondylosis.   Top most images suggests tooth decay in the mandibular canines and left premolar.   Review of the MIP images confirms the above findings.   IMPRESSION: 1. No filling defect is identified in the pulmonary arterial tree to suggest pulmonary embolus. 2.  Prominent main pulmonary artery raising the possibility of pulmonary arterial hypertension. 3. Ascending thoracic aortic aneurysm 4.2 cm in diameter. Recommend annual imaging followup by CTA or MRA. This recommendation follows 2010 ACCF/AHA/AATS/ACR/ASA/SCA/SCAI/SIR/STS/SVM Guidelines for the Diagnosis and Management of Patients with Thoracic Aortic Disease. Circulation. 2010; 121:2011 Aortic aneurysm NOS (ICD10-I71.9) 4. Mild cardiomegaly. 5. Mildly elevated left hemidiaphragm. 6. Linear subsegmental atelectasis or scarring in both lower lobes. 7. Top most images suggests tooth decay in the mandibular canines and left premolar. 8. Thoracic spondylosis. 9. Aortic atherosclerosis.   Aortic Atherosclerosis (ICD10-I70.0).     Electronically Signed   By: W: [id number].   On: 08/07/2022 15:55        Impression and  Plan: Risk Modification in those with ascending thoracic aortic aneurysm:  Continue good control of blood pressure (prefer SBP 130/80 or less)-Continue Lisinopril  2. Avoid fluoroquinolone antibiotics (I.e Ciprofloxacin, Avelox, Levofloxacin, Ofloxacin)  3.  Use of statin (to decrease cardiovascular risk)-Continue Zocor  4.  Exercise and activity limitations is individualized, but in general, contact sports are to be avoided and one should avoid heavy lifting (defined as half of ideal body weight) and exercises involving sustained Valsalva maneuver.  5. Counseling for those suspected of having genetically mediated disease. First-degree relatives of those with TAA disease should be screened as well as those who have a connective tissue disease (I.e with Marfan syndrome, Ehlers-Danlos syndrome,  and Loeys-Dietz syndrome) or a  bicuspid aortic valve,have an increased risk for  complications related to TAA. The aforementioned does not apply to this patient.  6. He has tobacco abuse, smoking cessation was highly encouraged.  RTC In 1 yr with CTA  Chest   Ellwood Handler, PA-C [facility] [phone]

## 2022-08-21 NOTE — Progress Notes (Signed)
      [address]       [place] [address]             [phone]       

## 2022-08-23 ENCOUNTER — Ambulatory Visit (HOSPITAL_COMMUNITY): Payer: Medicare Other | Attending: Internal Medicine | Admitting: Occupational Therapy

## 2022-08-23 DIAGNOSIS — R29898 Other symptoms and signs involving the musculoskeletal system: Secondary | ICD-10-CM | POA: Diagnosis present

## 2022-08-23 DIAGNOSIS — R296 Repeated falls: Secondary | ICD-10-CM | POA: Insufficient documentation

## 2022-08-23 DIAGNOSIS — M6281 Muscle weakness (generalized): Secondary | ICD-10-CM | POA: Insufficient documentation

## 2022-08-23 DIAGNOSIS — E782 Mixed hyperlipidemia: Secondary | ICD-10-CM | POA: Diagnosis present

## 2022-08-23 DIAGNOSIS — R001 Bradycardia, unspecified: Secondary | ICD-10-CM | POA: Diagnosis present

## 2022-08-23 DIAGNOSIS — I10 Essential (primary) hypertension: Secondary | ICD-10-CM | POA: Diagnosis present

## 2022-08-23 NOTE — Addendum Note (Signed)
Addended by: Marzetta Board on: 08/23/2022 04:30 AM   Modules accepted: Level of Service

## 2022-08-23 NOTE — Therapy (Unsigned)
OUTPATIENT PHYSICAL THERAPY LOWER EXTREMITY EVALUATION   Patient Name: Nathan [name] MRN: [id number] DOB:1936/06/17, 86 y.o., male Today's Date: 08/28/2022  END OF SESSION:  PT End of Session - 08/28/22 0900    Visit Number 1    Number of Visits 8   Date for PT Re-Evaluation 09/25/22    Authorization Type [employer] medicare    Progress Note Due on Visit 10    PT Start Time 0820    PT Stop Time 0955    PT Time Calculation (min) 95 min    Activity Tolerance Patient tolerated treatment well    Behavior During Therapy [facility] for tasks assessed/performed             Past Medical History:  Diagnosis Date   Anemia, normocytic normochromic    H&H of 12.3/35.5 in 01/2012   Auditory impairment    OS   Bradycardia    Evaluated by Dr. Cletus Gash in 2009   Carcinoma of prostate [facility]) 2004   Gout    Last symptomatic episode in 2006   Hyperlipidemia    Lipid profile in 01/2012:203, 475, 37, X   Hypertension    Systolic murmur    Consistent with mitral regurgitation   Tobacco abuse    0.5 pack per day; 30 pack years   Past Surgical History:  Procedure Laterality Date   bone spur Right    elbow   CATARACT EXTRACTION W/PHACO Left 07/11/2017   Procedure: CATARACT EXTRACTION PHACO AND INTRAOCULAR LENS PLACEMENT LEFT EYE;  Surgeon: Baruch Goldmann, MD;  Location: [facility];  Service: Ophthalmology;  Laterality: Left;  CDE: 7.35   CATARACT EXTRACTION W/PHACO Right 07/25/2017   Procedure: CATARACT EXTRACTION PHACO AND INTRAOCULAR LENS PLACEMENT RIGHT EYE CDE=5.32;  Surgeon: Baruch Goldmann, MD;  Location: [facility];  Service: Ophthalmology;  Laterality: Right;  right   COLONOSCOPY  10/02/2012   Procedure: COLONOSCOPY;  Surgeon: Danie Binder, MD;  Location: [facility];  Service: Endoscopy;  Laterality: N/A;  10:30 AM   PROSTATE SURGERY     Patient Active Problem List   Diagnosis Date Noted   Bradycardia with 41-50 beats per minute 08/26/2021   COVID-19 virus infection 08/26/2021   Gouty  arthropathy 08/04/2019   Proteinuria 08/04/2019   Vitamin D deficiency 12/21/2012   Chronic kidney disease 02/23/2012   Fasting hyperglycemia 02/23/2012   Anemia, normocytic normochromic    Systolic murmur    Bradycardia    Hypertension    Carcinoma of prostate ([place])    Tobacco abuse    Gout 02/19/2011    PCP: Dr. Rosita Fire  REFERRING PROVIDER: Ronni Rumble   REFERRING DIAG:  Diagnosis Description  PT eval/tx for recurrent falls/abnormal gait due to muscle weakness per Rosita Fire, MD    THERAPY DIAG:  History of falling Muscle weakness Decreased balance   Rationale for Evaluation and Treatment: Rehabilitation  ONSET DATE: November of 2022  SUBJECTIVE:   SUBJECTIVE STATEMENT: Pt had covid twice last year.  After the bout November of 2022 and had had weakness and falling ever since.  His latest fall was a couple of weeks ago.    PERTINENT HISTORY: Covid PAIN:  Are you having pain? No  PRECAUTIONS: Fall  WEIGHT BEARING RESTRICTIONS: No  FALLS:  Has patient fallen in last 6 months? Yes. Number of falls 2  LIVING ENVIRONMENT: Lives with: lives with their family Lives in: House/apartment Stairs: Yes: External: 3 steps; on right going up Has following equipment at home: Single point  cane he can go up and down the stairs  I .  OCCUPATION: retired  PLOF: Independent with household mobility with device  PATIENT GOALS: to be able to live by himself, his granddaughter is living with him at this time.   stop falling     OBJECTIVE:   DIAGNOSTIC FINDINGS: N/A   COGNITION: Overall cognitive status: Within functional limits for tasks assessed     POSTURE: rounded shoulders, forward head, increased thoracic kyphosis, and flexed trunk    LOWER EXTREMITY MMT:  MMT Right eval Left eval  Hip flexion 5 5  Hip extension 2 3-  Hip abduction 5 5  Hip adduction    Hip internal rotation    Hip external rotation    Knee flexion 4 4  Knee extension 5 5   Ankle dorsiflexion 5 5  Ankle plantarflexion    Ankle inversion    Ankle eversion     (Blank rows = not tested)    FUNCTIONAL TESTS:  30 second sit to stand: PT unable to stand up without use of UE.  With UE  7 x  2 minute walk test:  293 ft.  Pt looking at the floor the entire time.  No assistive device  Single leg stance: RT: 5"  , Lt:   11"    TODAY'S TREATMENT:                                                                                                                              DATE:  08/28/22: Evaluation Sit to stand x 10 Bridge x 10 Educated to attempt to stand taller.     PATIENT EDUCATION:  Education details: HEP, posture Person educated: Patient Education method: Customer service manager Education comprehension: returned demonstration  HOME EXERCISE PROGRAM: Access Code: [email or web address] URL: [email or web address] Date: 08/28/2022 Prepared by: Rayetta Humphrey  Exercises - Supine Bridge  - 2 x daily - 7 x weekly - 1 sets - 10 reps - 5" hold - Sit to Stand with Counter Support  - 3 x daily - 7 x weekly - 1 sets - 10 reps  ASSESSMENT:  CLINICAL IMPRESSION: Patient is a 86 y.o. male who was seen today for physical therapy evaluation and treatment for history of falling.  The pt states following November he had several falls and then he quit falling, however, two weeks ago he lost his balance and fell again.  Evaluation demonstrates weak gluteal maximus, hamstrings, posture dysfunction and decreased balance.  Mr. Knack will benefit from skilled PT to improve his balance and strength and decrease his falls.   OBJECTIVE IMPAIRMENTS: decreased balance, decreased strength, and postural dysfunction.   ACTIVITY LIMITATIONS: standing, stairs, and locomotion level  PARTICIPATION LIMITATIONS: shopping and community activity  PERSONAL FACTORS: Age are also affecting patient's functional outcome.   REHAB POTENTIAL: Good  CLINICAL DECISION MAKING:  Stable/uncomplicated  EVALUATION COMPLEXITY: Low   GOALS: Goals reviewed with patient? No  SHORT TERM GOALS:  Target date: 09/21/22 Pt to be I in HEP in order to increase his gluteal and hamstring strength by 1/2 grade to be able to come sit to stand from a higher chair without arms without difficulty.  Baseline: Goal status: INITIAL  2.  Pt to be able to single leg stance for 10 seconds to decrease risk of falls.  Baseline:  Goal status: INITIAL  3.  PT to be more aware of posture to be able to walk in an erect position  Baseline:  Goal status: INITIAL   LONG TERM GOALS: Target date: 09/25/22  Pt strength to improve one grade to allow pt to be confident walking outside without his cane.  Baseline:  Goal status: INITIAL  2.  Pt to have had no falls in the past 4 weeks  Baseline:  Goal status: INITIAL  PLAN:  PT FREQUENCY: 2x/week  PT DURATION: 4 weeks  PLANNED INTERVENTIONS: Therapeutic exercises, Therapeutic activity, Neuromuscular re-education, Balance training, Patient/Family education, and Self Care  PLAN FOR NEXT SESSION: review evaluation and goals.   PT to work on strengthening of the gluteus maximus and hamstrings as well as static and dynamic balance activities.     Rayetta Humphrey, PT CLT [phone]  08/28/2022,9:11 AM

## 2022-08-23 NOTE — Therapy (Signed)
OUTPATIENT OCCUPATIONAL THERAPY ORTHO EVALUATION  Patient Name: Nathan [name] MRN: [id number] DOB:07/31/1936, 86 y.o., male Today's Date: 08/23/2022  PCP: Carrolyn Meiers, MD REFERRING PROVIDER: Carrolyn Meiers, MD  END OF SESSION:   Past Medical History:  Diagnosis Date   Anemia, normocytic normochromic    H&H of 12.3/35.5 in 01/2012   Auditory impairment    OS   Bradycardia    Evaluated by Dr. Cletus Gash in 2009   Carcinoma of prostate [facility]) 2004   Gout    Last symptomatic episode in 2006   Hyperlipidemia    Lipid profile in 01/2012:203, 475, 37, X   Hypertension    Systolic murmur    Consistent with mitral regurgitation   Tobacco abuse    0.5 pack per day; 30 pack years   Past Surgical History:  Procedure Laterality Date   bone spur Right    elbow   CATARACT EXTRACTION W/PHACO Left 07/11/2017   Procedure: CATARACT EXTRACTION PHACO AND INTRAOCULAR LENS PLACEMENT LEFT EYE;  Surgeon: Baruch Goldmann, MD;  Location: [facility];  Service: Ophthalmology;  Laterality: Left;  CDE: 7.35   CATARACT EXTRACTION W/PHACO Right 07/25/2017   Procedure: CATARACT EXTRACTION PHACO AND INTRAOCULAR LENS PLACEMENT RIGHT EYE CDE=5.32;  Surgeon: Baruch Goldmann, MD;  Location: [facility];  Service: Ophthalmology;  Laterality: Right;  right   COLONOSCOPY  10/02/2012   Procedure: COLONOSCOPY;  Surgeon: Danie Binder, MD;  [facility];  Service: Endoscopy;  Laterality: N/A;  10:30 AM   PROSTATE SURGERY     Patient Active Problem List   Diagnosis Date Noted   Bradycardia with 41-50 beats per minute 08/26/2021   COVID-19 virus infection 08/26/2021   Gouty arthropathy 08/04/2019   Proteinuria 08/04/2019   Vitamin D deficiency 12/21/2012   Chronic kidney disease 02/23/2012   Fasting hyperglycemia 02/23/2012   Anemia, normocytic normochromic    Systolic murmur    Bradycardia    Hypertension    Carcinoma of prostate ([place])    Tobacco abuse    Gout 02/19/2011    ONSET  DATE: November 2022  REFERRING DIAG: Generalized weakness with recurrent falls  THERAPY DIAG:  No diagnosis found.  Rationale for Evaluation and Treatment: Rehabilitation  SUBJECTIVE:   SUBJECTIVE STATEMENT: "I've fallen a few times and I'm just weak." Pt accompanied by: self and family member  PERTINENT HISTORY: Pt had Covid back in November of 2022, which caused initial fall due to weakness. Since Covid, pt has had multiple falls and limited endurance.   PRECAUTIONS: Fall  WEIGHT BEARING RESTRICTIONS: No  PAIN:  Are you having pain? No  FALLS: Has patient fallen in last 6 months? Yes. Number of falls 2 falls due to weakness and balance  LIVING ENVIRONMENT: Lives with: lives alone Lives in: House/apartment Stairs: Yes: External: 3 steps; on right going up Has following equipment at home: Single point cane  PLOF: Independent with basic ADLs and Independent with household mobility with device  PATIENT GOALS: To be able to live by myself and take care of myself  OBJECTIVE:   HAND DOMINANCE: Left  ADLs: Overall ADLs: Reports independence, however granddaughter has been assisting him recently with IADL's.  Pt has not been able to drive since his last fall.  FUNCTIONAL OUTCOME MEASURES: Quick Dash: 4.55  UPPER EXTREMITY ROM:     Active ROM Right eval Left eval  Shoulder flexion [facility]  Shoulder abduction [facility]  Shoulder internal rotation [facility]  Shoulder external rotation [facility] WFL  (  Blank rows = not tested)   UPPER EXTREMITY MMT:     MMT Right eval Left eval  Shoulder flexion 4+/5 5/5  Shoulder abduction 4+/5 5/5  Shoulder adduction 5/5 5/5  Shoulder extension 5/5 5/5  Shoulder internal rotation 5/5 5/5  Shoulder external rotation 4+/5 5/5  Elbow flexion 5/5 5/5  Elbow extension 4+/5 4+/5  (Blank rows = not tested)  SENSATION: WFL  EDEMA: No Edema Noted  OBSERVATIONS: Pt is very hard of hearing, with poor ambulating balance.   TODAY'S  TREATMENT:                                                                                                                              DATE: 08/23/22: Evaluation Only    PATIENT EDUCATION: Education details: A/ROM - with water bottle weights Person educated: Patient Education method: Explanation, Demonstration, and Handouts Education comprehension: verbalized understanding and returned demonstration  HOME EXERCISE PROGRAM: A/ROM with water bottle weights  GOALS: Goals reviewed with patient? Yes  SHORT TERM GOALS: Target date: 08/24/22  *** Baseline: Goal status: {GOALSTATUS:25110}  2.  *** Baseline:  Goal status: {GOALSTATUS:25110}   ASSESSMENT:  CLINICAL IMPRESSION: Patient is a *** y.o. *** who was seen today for occupational therapy evaluation for ***.   PERFORMANCE DEFICITS: in functional skills including {OT physical skills:25468}, cognitive skills including {OT cognitive skills:25469}, and psychosocial skills including {OT psychosocial skills:25470}.   IMPAIRMENTS: are limiting patient from {OT performance deficits:25471}.   COMORBIDITIES: {Comorbidities:25485} that affects occupational performance. Patient will benefit from skilled OT to address above impairments and improve overall function.  MODIFICATION OR ASSISTANCE TO COMPLETE EVALUATION: {OT modification:25474}  OT OCCUPATIONAL PROFILE AND HISTORY: {OT PROFILE AND HISTORY:25484}  CLINICAL DECISION MAKING: {OT CDM:25475}  REHAB POTENTIAL: {rehabpotential:25112}  EVALUATION COMPLEXITY: {Evaluation complexity:25115}      PLAN:  OT FREQUENCY: {rehab frequency:25116}  OT DURATION: {rehab duration:25117}  PLANNED INTERVENTIONS: {OT Interventions:25467}  RECOMMENDED OTHER SERVICES: ***  CONSULTED AND AGREED WITH PLAN OF CARE: {TZG:01749}  PLAN FOR NEXT SESSION: ***   Jerita Wimbush Isabelle Course, OT 08/23/2022, 9:55 AM

## 2022-08-23 NOTE — Patient Instructions (Signed)

## 2022-08-26 ENCOUNTER — Institutional Professional Consult (permissible substitution) (INDEPENDENT_AMBULATORY_CARE_PROVIDER_SITE_OTHER): Payer: Medicare Other | Admitting: Physician Assistant

## 2022-08-26 VITALS — BP 92/60 | HR 74 | Resp 20 | Ht 69.0 in | Wt 142.0 lb

## 2022-08-26 DIAGNOSIS — I7121 Aneurysm of the ascending aorta, without rupture: Secondary | ICD-10-CM

## 2022-08-27 ENCOUNTER — Encounter (HOSPITAL_COMMUNITY): Payer: Self-pay | Admitting: Occupational Therapy

## 2022-08-28 ENCOUNTER — Ambulatory Visit (HOSPITAL_COMMUNITY): Payer: Medicare Other | Admitting: Physical Therapy

## 2022-08-28 DIAGNOSIS — R29898 Other symptoms and signs involving the musculoskeletal system: Secondary | ICD-10-CM

## 2022-08-28 DIAGNOSIS — M6281 Muscle weakness (generalized): Secondary | ICD-10-CM

## 2022-08-28 DIAGNOSIS — R296 Repeated falls: Secondary | ICD-10-CM

## 2022-09-02 ENCOUNTER — Ambulatory Visit: Payer: Medicare Other | Admitting: Cardiology

## 2022-09-02 ENCOUNTER — Encounter: Payer: Self-pay | Admitting: Cardiology

## 2022-09-02 VITALS — BP 124/79 | HR 53 | Ht 69.0 in | Wt 142.0 lb

## 2022-09-02 DIAGNOSIS — R001 Bradycardia, unspecified: Secondary | ICD-10-CM | POA: Diagnosis not present

## 2022-09-02 DIAGNOSIS — E782 Mixed hyperlipidemia: Secondary | ICD-10-CM | POA: Diagnosis not present

## 2022-09-02 DIAGNOSIS — I10 Essential (primary) hypertension: Secondary | ICD-10-CM

## 2022-09-02 NOTE — Progress Notes (Signed)
Clinical Summary [name] is a 86 y.o.male seen today for follow up of the following medical problems.   1. Sinus bradycardia - long history of sinus brady that has been asymptomatic per prior notes. Thyroid studies have been normal, has not been on AV nodal agents - has been asymptomatic historically despite significant sinus brady - seen by EP, plan for watchful waiting.       -08/2021 admit episodes of junctional brady to 30s, stable bp's and asymptomatic - denies any specific symptoms   2. HTN -compliant with meds    3. Hyperlipidemia  -upcoming labs with pcp 01/2021 LDL 67   4. CKD 3b - followed by Dr Theador Hawthorne  5. Aortic aneurysm - 07/2022 CT PE: 4.2 cm ascending aortic aneurysm - seen by CT surgery, due for repeat scan 1 year.    SH: wife passed November 2017 due to cancer.  Past Medical History:  Diagnosis Date   Anemia, normocytic normochromic    H&H of 12.3/35.5 in 01/2012   Auditory impairment    OS   Bradycardia    Evaluated by Dr. Cletus Gash in 2009   Carcinoma of prostate [facility]) 2004   Gout    Last symptomatic episode in 2006   Hyperlipidemia    Lipid profile in 01/2012:203, 475, 37, X   Hypertension    Systolic murmur    Consistent with mitral regurgitation   Tobacco abuse    0.5 pack per day; 30 pack years     No Known Allergies   Current Outpatient Medications  Medication Sig Dispense Refill   allopurinol (ZYLOPRIM) 100 MG tablet Take 200 mg by mouth daily.     aspirin EC 81 MG tablet Take 81 mg by mouth daily.     cholecalciferol (VITAMIN D) 1000 units tablet Take 1,000 Units by mouth 3 (three) times a week.      ketoconazole (NIZORAL) 2 % cream Apply to both feet and between toes once daily for 6 weeks. 60 g 1   lisinopril (ZESTRIL) 5 MG tablet Take 5 mg by mouth daily.     simvastatin (ZOCOR) 20 MG tablet TAKE 1 TABLET BY MOUTH EVERYDAY AT BEDTIME 90 tablet 3   sodium bicarbonate 650 MG tablet Take 650-1,300 mg by mouth 2 (two)  times daily. Takes 2 tabs in the morning and 1 in the evening     No current facility-administered medications for this visit.     Past Surgical History:  Procedure Laterality Date   bone spur Right    elbow   CATARACT EXTRACTION W/PHACO Left 07/11/2017   Procedure: CATARACT EXTRACTION PHACO AND INTRAOCULAR LENS PLACEMENT LEFT EYE;  Surgeon: Baruch Goldmann, MD;  Location: [facility];  Service: Ophthalmology;  Laterality: Left;  CDE: 7.35   CATARACT EXTRACTION W/PHACO Right 07/25/2017   Procedure: CATARACT EXTRACTION PHACO AND INTRAOCULAR LENS PLACEMENT RIGHT EYE CDE=5.32;  Surgeon: Baruch Goldmann, MD;  Location: [facility];  Service: Ophthalmology;  Laterality: Right;  right   COLONOSCOPY  10/02/2012   Procedure: COLONOSCOPY;  Surgeon: Danie Binder, MD;  [facility];  Service: Endoscopy;  Laterality: N/A;  10:30 AM   PROSTATE SURGERY       No Known Allergies    Family History  Problem Relation Age of Onset   Heart disease Mother    Arrhythmia Mother        brother(brady)   Coronary artery disease Neg Hx    Colon cancer Neg Hx  Social History Mr. Every reports that he has been smoking cigarettes. He started smoking about 64 years ago. He has a 27.50 pack-year smoking history. He has never used smokeless tobacco. Nathan [name] reports that he does not currently use alcohol after a past usage of about 4.0 standard drinks of alcohol per week.   Review of Systems CONSTITUTIONAL: No weight loss, fever, chills, weakness or fatigue.  HEENT: Eyes: No visual loss, blurred vision, double vision or yellow sclerae.No hearing loss, sneezing, congestion, runny nose or sore throat.  SKIN: No rash or itching.  CARDIOVASCULAR: per hpi RESPIRATORY: No shortness of breath, cough or sputum.  GASTROINTESTINAL: No anorexia, nausea, vomiting or diarrhea. No abdominal pain or blood.  GENITOURINARY: No burning on urination, no polyuria NEUROLOGICAL: No headache, dizziness, syncope,  paralysis, ataxia, numbness or tingling in the extremities. No change in bowel or bladder control.  MUSCULOSKELETAL: No muscle, back pain, joint pain or stiffness.  LYMPHATICS: No enlarged nodes. No history of splenectomy.  PSYCHIATRIC: No history of depression or anxiety.  ENDOCRINOLOGIC: No reports of sweating, cold or heat intolerance. No polyuria or polydipsia.  Nathan [name]   Physical Examination Vitals:   09/02/22 1035 09/02/22 1058  BP: (!) 142/78 124/79  Pulse: (!) 53   SpO2: 100%    Filed Weights   09/02/22 1035  Weight: 142 lb (64.4 kg)    Gen: resting comfortably, no acute distress HEENT: no scleral icterus, pupils equal round and reactive, no palptable cervical adenopathy,  CV: RRR, no m/r/g no jvd Resp: Clear to auscultation bilaterally GI: abdomen is soft, non-tender, non-distended, normal bowel sounds, no hepatosplenomegaly MSK: extremities are warm, no edema.  Skin: warm, no rash Neuro:  no focal deficits Psych: appropriate affect   Diagnostic Studies  08/2021 echo 1. Bradycardic during exam with HR 40.   2. Left ventricular ejection fraction, by estimation, is 55 to 60%. The  left ventricle has normal function. The left ventricle has no regional  wall motion abnormalities. Left ventricular diastolic parameters were  normal.   3. Right ventricular systolic function is normal. The right ventricular  size is normal.   4. Left atrial size was mildly dilated.   5. The mitral valve is normal in structure. Trivial mitral valve  regurgitation. No evidence of mitral stenosis.   6. The aortic valve is tricuspid. There is mild calcification of the  aortic valve. There is mild thickening of the aortic valve. Aortic valve  regurgitation is not visualized. Aortic valve sclerosis/calcification is  present, without any evidence of  aortic stenosis.    Assessment and Plan   1. Sinus bradycardia -long history of sinus bradycardia, he has been asymptomatic. - continues to be  asymptomatic, we will continue to monitor at this time.      2. HTN -at goal based on manural repeat check, continue current meds   3. Hyperlipidemia -at goal, continue simvastatin.   F/u 6 months     Nathan [name], M.D.

## 2022-09-02 NOTE — Patient Instructions (Signed)
Medication Instructions:  Your physician recommends that you continue on your current medications as directed. Please refer to the Current Medication list given to you today.   Labwork: None  Testing/Procedures: None  Follow-Up: Follow up with Dr. Branch in 6 months.   Any Other Special Instructions Will Be Listed Below (If Applicable).     If you need a refill on your cardiac medications before your next appointment, please call your pharmacy.  

## 2022-09-03 ENCOUNTER — Ambulatory Visit (HOSPITAL_COMMUNITY): Payer: Medicare Other | Admitting: Physical Therapy

## 2022-09-03 ENCOUNTER — Encounter (HOSPITAL_COMMUNITY): Payer: Self-pay | Admitting: Physical Therapy

## 2022-09-03 DIAGNOSIS — R29898 Other symptoms and signs involving the musculoskeletal system: Secondary | ICD-10-CM

## 2022-09-03 DIAGNOSIS — M6281 Muscle weakness (generalized): Secondary | ICD-10-CM

## 2022-09-03 DIAGNOSIS — R296 Repeated falls: Secondary | ICD-10-CM

## 2022-09-03 NOTE — Therapy (Signed)
OUTPATIENT PHYSICAL THERAPY TREATMENT   Patient Name: Nathan [name] MRN: [id number] DOB:1936-08-24, 86 y.o., male Today's Date: 09/03/2022  END OF SESSION:   PT End of Session - 09/03/22 1432     Visit Number 2    Number of Visits 8    Date for PT Re-Evaluation 09/25/22    Authorization Type BCBS medicare    Progress Note Due on Visit 10    PT Start Time 1432    PT Stop Time 1510    PT Time Calculation (min) 38 min    Activity Tolerance Patient tolerated treatment well    Behavior During Therapy WFL for tasks assessed/performed               Past Medical History:  Diagnosis Date   Anemia, normocytic normochromic    H&H of 12.3/35.5 in 01/2012   Auditory impairment    OS   Bradycardia    Evaluated by Dr. Cletus Gash in 2009   Carcinoma of prostate [facility]) 2004   Gout    Last symptomatic episode in 2006   Hyperlipidemia    Lipid profile in 01/2012:203, 475, 37, X   Hypertension    Systolic murmur    Consistent with mitral regurgitation   Tobacco abuse    0.5 pack per day; 30 pack years   Past Surgical History:  Procedure Laterality Date   bone spur Right    elbow   CATARACT EXTRACTION W/PHACO Left 07/11/2017   Procedure: CATARACT EXTRACTION PHACO AND INTRAOCULAR LENS PLACEMENT LEFT EYE;  Surgeon: Baruch Goldmann, MD;  Location: [facility];  Service: Ophthalmology;  Laterality: Left;  CDE: 7.35   CATARACT EXTRACTION W/PHACO Right 07/25/2017   Procedure: CATARACT EXTRACTION PHACO AND INTRAOCULAR LENS PLACEMENT RIGHT EYE CDE=5.32;  Surgeon: Baruch Goldmann, MD;  Location: [facility];  Service: Ophthalmology;  Laterality: Right;  right   COLONOSCOPY  10/02/2012   Procedure: COLONOSCOPY;  Surgeon: Danie Binder, MD;  [facility]: [facility];  Service: Endoscopy;  Laterality: N/A;  10:30 AM   PROSTATE SURGERY     Patient Active Problem List   Diagnosis Date Noted   Bradycardia with 41-50 beats per minute 08/26/2021   COVID-19 virus infection 08/26/2021   Gouty arthropathy  08/04/2019   Proteinuria 08/04/2019   Vitamin D deficiency 12/21/2012   Chronic kidney disease 02/23/2012   Fasting hyperglycemia 02/23/2012   Anemia, normocytic normochromic    Systolic murmur    Bradycardia    Hypertension    Carcinoma of prostate ([place])    Tobacco abuse    Gout 02/19/2011    PCP: Dr. Rosita Fire  REFERRING PROVIDER: Ronni Rumble   REFERRING DIAG:  Diagnosis Description  PT eval/tx for recurrent falls/abnormal gait due to muscle weakness per Rosita Fire, MD    THERAPY DIAG:  History of falling Muscle weakness Decreased balance   Rationale for Evaluation and Treatment: Rehabilitation  ONSET DATE: November of 2022  SUBJECTIVE:   SUBJECTIVE STATEMENT: His left hip started hurting but after resting it quit. He did HEP a little bit.   PERTINENT HISTORY: Covid PAIN:  Are you having pain? No  PRECAUTIONS: Fall  WEIGHT BEARING RESTRICTIONS: No  FALLS:  Has patient fallen in last 6 months? Yes. Number of falls 2  LIVING ENVIRONMENT: Lives with: lives with their family Lives in: House/apartment Stairs: Yes: External: 3 steps; on right going up Has following equipment at home: Single point cane he can go up and down the stairs  I .  OCCUPATION: retired  PLOF: Independent with household mobility with device  PATIENT GOALS: to be able to live by himself, his granddaughter is living with him at this time.   stop falling     OBJECTIVE:   DIAGNOSTIC FINDINGS: N/A   COGNITION: Overall cognitive status: Within functional limits for tasks assessed     POSTURE: rounded shoulders, forward head, increased thoracic kyphosis, and flexed trunk    LOWER EXTREMITY MMT:  MMT Right eval Left eval  Hip flexion 5 5  Hip extension 2 3-  Hip abduction 5 5  Hip adduction    Hip internal rotation    Hip external rotation    Knee flexion 4 4  Knee extension 5 5  Ankle dorsiflexion 5 5  Ankle plantarflexion    Ankle inversion    Ankle  eversion     (Blank rows = not tested)    FUNCTIONAL TESTS:  30 second sit to stand: PT unable to stand up without use of UE.  With UE  7 x  2 minute walk test:  293 ft.  Pt looking at the floor the entire time.  No assistive device  Single leg stance: RT: 5"  , Lt:   11"    TODAY'S TREATMENT:                                                                                                                              DATE:  09/03/22 Bridge 2 x 10  Clam 2 x 10 bilateral  DKTC 10 x 5 second holds with heels on red ball STS 2 x 10 UE use to transfer to standing, without UE for return to sitting Standing hip abduction 2 x 10 bilateral  Seated Row GTB 2x 10  Standing HR 2 x 10  Standing TR 2 x 10   08/28/22: Evaluation Sit to stand x 10 Bridge x 10 Educated to attempt to stand taller.     PATIENT EDUCATION:  Education details: 09/03/22 HEP, posture Person educated: Patient Education method: Explanation and Demonstration Education comprehension: returned demonstration  HOME EXERCISE PROGRAM: Access Code: [email or web address] URL: [email or web address]  12/12 - Standing Hip Abduction with Counter Support  - 1 x daily - 7 x weekly - 2 sets - 10 reps - Heel Raises with Counter Support  - 1 x daily - 7 x weekly - 2 sets - 10 reps  Date: 08/28/2022 - Supine Bridge  - 2 x daily - 7 x weekly - 1 sets - 10 reps - 5" hold - Sit to Stand with Counter Support  - 3 x daily - 7 x weekly - 1 sets - 10 reps  ASSESSMENT:  CLINICAL IMPRESSION: Began session with previously completed exercises which are tolerated well. Additional glute strengthening tolerated well. Patient unable to transfer to standing without UE support but does demonstrate fair eccentric control to sitting. Patient stating moderate fatigue so transitioned to postural strengthening which he is able to  complete with intermittent cueing for posture. Patient eager to improve LE strength and mobility. Patient will continue to  benefit from physical therapy in order to improve function and reduce impairment.   OBJECTIVE IMPAIRMENTS: decreased balance, decreased strength, and postural dysfunction.   ACTIVITY LIMITATIONS: standing, stairs, and locomotion level  PARTICIPATION LIMITATIONS: shopping and community activity  PERSONAL FACTORS: Age are also affecting patient's functional outcome.   REHAB POTENTIAL: Good  CLINICAL DECISION MAKING: Stable/uncomplicated  EVALUATION COMPLEXITY: Low   GOALS: Goals reviewed with patient? No  SHORT TERM GOALS: Target date: 09/21/22 Pt to be I in HEP in order to increase his gluteal and hamstring strength by 1/2 grade to be able to come sit to stand from a higher chair without arms without difficulty.  Baseline: Goal status: INITIAL  2.  Pt to be able to single leg stance for 10 seconds to decrease risk of falls.  Baseline:  Goal status: INITIAL  3.  PT to be more aware of posture to be able to walk in an erect position  Baseline:  Goal status: INITIAL   LONG TERM GOALS: Target date: 09/25/22  Pt strength to improve one grade to allow pt to be confident walking outside without his cane.  Baseline:  Goal status: INITIAL  2.  Pt to have had no falls in the past 4 weeks  Baseline:  Goal status: INITIAL  PLAN:  PT FREQUENCY: 2x/week  PT DURATION: 4 weeks  PLANNED INTERVENTIONS: Therapeutic exercises, Therapeutic activity, Neuromuscular re-education, Balance training, Patient/Family education, and Self Care  PLAN FOR NEXT SESSION:  PT to work on strengthening of the gluteus maximus and hamstrings as well as static and dynamic balance activities.     2:32 PM, 09/03/22 Mearl Latin PT, DPT Physical Therapist at [facility]

## 2022-09-04 ENCOUNTER — Encounter: Payer: Self-pay | Admitting: *Deleted

## 2022-09-05 ENCOUNTER — Ambulatory Visit (HOSPITAL_COMMUNITY): Payer: Medicare Other | Admitting: Physical Therapy

## 2022-09-05 DIAGNOSIS — R29898 Other symptoms and signs involving the musculoskeletal system: Secondary | ICD-10-CM | POA: Diagnosis not present

## 2022-09-05 DIAGNOSIS — M6281 Muscle weakness (generalized): Secondary | ICD-10-CM

## 2022-09-05 NOTE — Therapy (Signed)
OUTPATIENT PHYSICAL THERAPY TREATMENT   Patient Name: Nathan [name] MRN: [id number] DOB:04-17-1936, 86 y.o., male Today's Date: 09/05/2022  END OF SESSION:   PT End of Session - 09/05/22 164 8     Visit Number 3    Number of Visits 8    Date for PT Re-Evaluation 09/25/22    Authorization Type BCBS medicare    Progress Note Due on Visit 10    PT Start Time 1608    PT Stop Time 1646    PT Time Calculation (min) 38 min    Activity Tolerance Patient tolerated treatment well    Behavior During Therapy WFL for tasks assessed/performed               Past Medical History:  Diagnosis Date   Anemia, normocytic normochromic    H&H of 12.3/35.5 in 01/2012   Auditory impairment    OS   Bradycardia    Evaluated by Dr. Cletus Gash in 2009   Carcinoma of prostate [facility]) 2004   Gout    Last symptomatic episode in 2006   Hyperlipidemia    Lipid profile in 01/2012:203, 475, 37, X   Hypertension    Systolic murmur    Consistent with mitral regurgitation   Tobacco abuse    0.5 pack per day; 30 pack years   Past Surgical History:  Procedure Laterality Date   bone spur Right    elbow   CATARACT EXTRACTION W/PHACO Left 07/11/2017   Procedure: CATARACT EXTRACTION PHACO AND INTRAOCULAR LENS PLACEMENT LEFT EYE;  Surgeon: Baruch Goldmann, MD;  Location: [facility];  Service: Ophthalmology;  Laterality: Left;  CDE: 7.35   CATARACT EXTRACTION W/PHACO Right 07/25/2017   Procedure: CATARACT EXTRACTION PHACO AND INTRAOCULAR LENS PLACEMENT RIGHT EYE CDE=5.32;  Surgeon: Baruch Goldmann, MD;  Location: [facility];  Service: Ophthalmology;  Laterality: Right;  right   COLONOSCOPY  10/02/2012   Procedure: COLONOSCOPY;  Surgeon: Danie Binder, MD;  [facility];  Service: Endoscopy;  Laterality: N/A;  10:30 AM   PROSTATE SURGERY     Patient Active Problem List   Diagnosis Date Noted   Bradycardia with 41-50 beats per minute 08/26/2021   COVID-19 virus infection 08/26/2021   Gouty arthropathy  08/04/2019   Proteinuria 08/04/2019   Vitamin D deficiency 12/21/2012   Chronic kidney disease 02/23/2012   Fasting hyperglycemia 02/23/2012   Anemia, normocytic normochromic    Systolic murmur    Bradycardia    Hypertension    Carcinoma of prostate ([place])    Tobacco abuse    Gout 02/19/2011    PCP: Dr. Rosita Fire  REFERRING PROVIDER: Ronni Rumble   REFERRING DIAG:  Diagnosis Description  PT eval/tx for recurrent falls/abnormal gait due to muscle weakness per Rosita Fire, MD    THERAPY DIAG:  History of falling Muscle weakness Decreased balance   Rationale for Evaluation and Treatment: Rehabilitation  ONSET DATE: November of 2022  SUBJECTIVE:   SUBJECTIVE STATEMENT: Pt states that he has been going back and forth to the MD and hasn't done his exercises as much as he should have.    PERTINENT HISTORY: Covid PAIN:  Are you having pain? No  PRECAUTIONS: Fall  WEIGHT BEARING RESTRICTIONS: No  FALLS:  Has patient fallen in last 6 months? Yes. Number of falls 2  LIVING ENVIRONMENT: Lives with: lives with their family Lives in: House/apartment Stairs: Yes: External: 3 steps; on right going up Has following equipment at home: Single point cane he can  go up and down the stairs  I .  OCCUPATION: retired  PLOF: Independent with household mobility with device  PATIENT GOALS: to be able to live by himself, his granddaughter is living with him at this time.   stop falling     OBJECTIVE:   DIAGNOSTIC FINDINGS: N/A   COGNITION: Overall cognitive status: Within functional limits for tasks assessed     POSTURE: rounded shoulders, forward head, increased thoracic kyphosis, and flexed trunk    LOWER EXTREMITY MMT:  MMT Right eval Left eval  Hip flexion 5 5  Hip extension 2 3-  Hip abduction 5 5  Hip adduction    Hip internal rotation    Hip external rotation    Knee flexion 4 4  Knee extension 5 5  Ankle dorsiflexion 5 5  Ankle plantarflexion     Ankle inversion    Ankle eversion     (Blank rows = not tested)    FUNCTIONAL TESTS:  30 second sit to stand: PT unable to stand up without use of UE.  With UE  7 x  2 minute walk test:  293 ft.  Pt looking at the floor the entire time.  No assistive device  Single leg stance: RT: 5"  , Lt:   11"    TODAY'S TREATMENT:                                                                                                                              DATE:  09/05/22 Standing:  Heel raises x 10 Functional squat x 10 Side step x 2 RT Step up 4' step B x 15 Sit to stand x 15 Supine : Bridge x 15  Knee to chest x 3 Active hamstring stretch 3 x 30"  B Sidelying : Hip abduction x 15 B Clam x 15 B    09/03/22 Bridge 2 x 10  Clam 2 x 10 bilateral  DKTC 10 x 5 second holds with heels on red ball STS 2 x 10 UE use to transfer to standing, without UE for return to sitting Standing hip abduction 2 x 10 bilateral  Seated Row GTB 2x 10  Standing HR 2 x 10  Standing TR 2 x 10   08/28/22: Evaluation Sit to stand x 10 Bridge x 10 Educated to attempt to stand taller.     PATIENT EDUCATION:  Education details: 09/03/22 HEP, posture Person educated: Patient Education method: Explanation and Demonstration Education comprehension: returned demonstration  HOME EXERCISE PROGRAM:           12/14:            Functional squat           Knee to chest            Active hamstring stretch   Access Code: [email or web address] URL: [email or web address]  12/12 - Standing Hip Abduction with Counter Support  - 1 x daily - 7 x weekly -  2 sets - 10 reps - Heel Raises with Counter Support  - 1 x daily - 7 x weekly - 2 sets - 10 reps  Date: 08/28/2022 - Supine Bridge  - 2 x daily - 7 x weekly - 1 sets - 10 reps - 5" hold - Sit to Stand with Counter Support  - 3 x daily - 7 x weekly - 1 sets - 10 reps  ASSESSMENT:  CLINICAL IMPRESSION: Began to focus on more Wt bearing activity this session.  PT had low confidence on sit to stand but he was able to complete task without UE assist. Patient will continue to benefit from physical therapy in order to improve function and reduce impairment.   OBJECTIVE IMPAIRMENTS: decreased balance, decreased strength, and postural dysfunction.   ACTIVITY LIMITATIONS: standing, stairs, and locomotion level  PARTICIPATION LIMITATIONS: shopping and community activity  PERSONAL FACTORS: Age are also affecting patient's functional outcome.   REHAB POTENTIAL: Good  CLINICAL DECISION MAKING: Stable/uncomplicated  EVALUATION COMPLEXITY: Low   GOALS: Goals reviewed with patient? No  SHORT TERM GOALS: Target date: 09/21/22 Pt to be I in HEP in order to increase his gluteal and hamstring strength by 1/2 grade to be able to come sit to stand from a higher chair without arms without difficulty.  Baseline: Goal status: INITIAL  2.  Pt to be able to single leg stance for 10 seconds to decrease risk of falls.  Baseline:  Goal status: INITIAL  3.  PT to be more aware of posture to be able to walk in an erect position  Baseline:  Goal status: INITIAL   LONG TERM GOALS: Target date: 09/25/22  Pt strength to improve one grade to allow pt to be confident walking outside without his cane.  Baseline:  Goal status: INITIAL  2.  Pt to have had no falls in the past 4 weeks  Baseline:  Goal status: INITIAL  PLAN:  PT FREQUENCY: 2x/week  PT DURATION: 4 weeks  PLANNED INTERVENTIONS: Therapeutic exercises, Therapeutic activity, Neuromuscular re-education, Balance training, Patient/Family education, and Self Care  PLAN FOR NEXT SESSION:  PT to work on strengthening of the gluteus maximus and hamstrings as well as static and dynamic balance activities.    Rayetta Humphrey, PT CLT [phone]  16:50

## 2022-09-11 ENCOUNTER — Ambulatory Visit (HOSPITAL_COMMUNITY): Payer: Medicare Other | Admitting: Physical Therapy

## 2022-09-11 DIAGNOSIS — M6281 Muscle weakness (generalized): Secondary | ICD-10-CM

## 2022-09-11 DIAGNOSIS — R29898 Other symptoms and signs involving the musculoskeletal system: Secondary | ICD-10-CM | POA: Diagnosis not present

## 2022-09-11 DIAGNOSIS — R296 Repeated falls: Secondary | ICD-10-CM

## 2022-09-11 NOTE — Therapy (Signed)
OUTPATIENT PHYSICAL THERAPY TREATMENT   Patient Name: Nathan [name] MRN: [id number] DOB:1936-04-22, 86 y.o., male Today's Date: 09/11/2022  END OF SESSION:   PT End of Session - 09/11/22 1025    Visit Number 4    Number of Visits 8    Date for PT Re-Evaluation 09/25/22    Authorization Type BCBS medicare    Progress Note Due on Visit 10    PT Start Time 0945    PT Stop Time 1025   PT Time Calculation (min) 40 min    Activity Tolerance Patient tolerated treatment well    Behavior During Therapy WFL for tasks assessed/performed                   Past Medical History:  Diagnosis Date   Anemia, normocytic normochromic    H&H of 12.3/35.5 in 01/2012   Auditory impairment    OS   Bradycardia    Evaluated by Dr. Cletus Gash in 2009   Carcinoma of prostate [facility]) 2004   Gout    Last symptomatic episode in 2006   Hyperlipidemia    Lipid profile in 01/2012:203, 475, 37, X   Hypertension    Systolic murmur    Consistent with mitral regurgitation   Tobacco abuse    0.5 pack per day; 30 pack years   Past Surgical History:  Procedure Laterality Date   bone spur Right    elbow   CATARACT EXTRACTION W/PHACO Left 07/11/2017   Procedure: CATARACT EXTRACTION PHACO AND INTRAOCULAR LENS PLACEMENT LEFT EYE;  Surgeon: Baruch Goldmann, MD;  Location: [facility];  Service: Ophthalmology;  Laterality: Left;  CDE: 7.35   CATARACT EXTRACTION W/PHACO Right 07/25/2017   Procedure: CATARACT EXTRACTION PHACO AND INTRAOCULAR LENS PLACEMENT RIGHT EYE CDE=5.32;  Surgeon: Baruch Goldmann, MD;  Location: [facility];  Service: Ophthalmology;  Laterality: Right;  right   COLONOSCOPY  10/02/2012   Procedure: COLONOSCOPY;  Surgeon: Danie Binder, MD;  [facility];  Service: Endoscopy;  Laterality: N/A;  10:30 AM   PROSTATE SURGERY     Patient Active Problem List   Diagnosis Date Noted   Bradycardia with 41-50 beats per minute 08/26/2021   COVID-19 virus infection 08/26/2021   Gouty arthropathy  08/04/2019   Proteinuria 08/04/2019   Vitamin D deficiency 12/21/2012   Chronic kidney disease 02/23/2012   Fasting hyperglycemia 02/23/2012   Anemia, normocytic normochromic    Systolic murmur    Bradycardia    Hypertension    Carcinoma of prostate ([place])    Tobacco abuse    Gout 02/19/2011    PCP: Dr. Rosita Fire  REFERRING PROVIDER: Ronni Rumble   REFERRING DIAG:  Diagnosis Description  PT eval/tx for recurrent falls/abnormal gait due to muscle weakness per Rosita Fire, MD    THERAPY DIAG:  History of falling Muscle weakness Decreased balance   Rationale for Evaluation and Treatment: Rehabilitation  ONSET DATE: November of 2022  SUBJECTIVE:   SUBJECTIVE STATEMENT: PT states that he is feeling pretty good today. He has not done any exercises since he left here due to hip pain but no pain now.  PERTINENT HISTORY: Covid PAIN:  Are you having pain? No  PRECAUTIONS: Fall  WEIGHT BEARING RESTRICTIONS: No  FALLS:  Has patient fallen in last 6 months? Yes. Number of falls 2  LIVING ENVIRONMENT: Lives with: lives with their family Lives in: House/apartment Stairs: Yes: External: 3 steps; on right going up Has following equipment at home: Single point cane  he can go up and down the stairs  I .  OCCUPATION: retired  PLOF: Independent with household mobility with device  PATIENT GOALS: to be able to live by himself, his granddaughter is living with him at this time.   stop falling     OBJECTIVE:   DIAGNOSTIC FINDINGS: N/A   COGNITION: Overall cognitive status: Within functional limits for tasks assessed     POSTURE: rounded shoulders, forward head, increased thoracic kyphosis, and flexed trunk    LOWER EXTREMITY MMT:  MMT Right eval Left eval  Hip flexion 5 5  Hip extension 2 3-  Hip abduction 5 5  Hip adduction    Hip internal rotation    Hip external rotation    Knee flexion 4 4  Knee extension 5 5  Ankle dorsiflexion 5 5  Ankle  plantarflexion    Ankle inversion    Ankle eversion     (Blank rows = not tested)    FUNCTIONAL TESTS:  30 second sit to stand: PT unable to stand up without use of UE.  With UE  7 x  2 minute walk test:  293 ft.  Pt looking at the floor the entire time.  No assistive device  Single leg stance: RT: 5"  , Lt:   11"    TODAY'S TREATMENT:                                                                                                                              DATE:  09/11/22 Standing:  Heel raises x 15 Functional squat x 15 Side step x 2 RT with green theraband  Step up 4"step B x 15 Step down B x 10 4" step Sit to stand x 10 Single leg stance: x 2 quit due to this causing increased hip pain  Tandem stance x 3  Leg extension on bodycraft 3 Pl x 10    09/05/22 Standing:  Heel raises x 10 Functional squat x 10 Side step x 2 RT Step up 4' step B x 15 Sit to stand x 15 Supine : Bridge x 15  Knee to chest x 3 Active hamstring stretch 3 x 30"  B Sidelying : Hip abduction x 15 B Clam x 15 B    09/03/22 Bridge 2 x 10  Clam 2 x 10 bilateral  DKTC 10 x 5 second holds with heels on red ball STS 2 x 10 UE use to transfer to standing, without UE for return to sitting Standing hip abduction 2 x 10 bilateral  Seated Row GTB 2x 10  Standing HR 2 x 10  Standing TR 2 x 10   08/28/22: Evaluation Sit to stand x 10 Bridge x 10 Educated to attempt to stand taller.     PATIENT EDUCATION:  Education details: 09/03/22 HEP, posture Person educated: Patient Education method: Customer service manager Education comprehension: returned demonstration  HOME EXERCISE PROGRAM:  12/14:            Functional squat           Knee to chest            Active hamstring stretch   Access Code: [email or web address] URL: [email or web address]  12/12 - Standing Hip Abduction with Counter Support  - 1 x daily - 7 x weekly - 2 sets - 10 reps - Heel Raises with Counter  Support  - 1 x daily - 7 x weekly - 2 sets - 10 reps  Date: 08/28/2022 - Supine Bridge  - 2 x daily - 7 x weekly - 1 sets - 10 reps - 5" hold - Sit to Stand with Counter Support  - 3 x daily - 7 x weekly - 1 sets - 10 reps  ASSESSMENT:  CLINICAL IMPRESSION:  Pt encouraged to complete HEP.  Treatment almost entirely based on Wt bearing activity this session. Therapist increased resistance with theraband.  Added hip extension on body craft. Patient will continue to benefit from physical therapy in order to improve function and reduce impairment.   OBJECTIVE IMPAIRMENTS: decreased balance, decreased strength, and postural dysfunction.   ACTIVITY LIMITATIONS: standing, stairs, and locomotion level  PARTICIPATION LIMITATIONS: shopping and community activity  PERSONAL FACTORS: Age are also affecting patient's functional outcome.   REHAB POTENTIAL: Good  CLINICAL DECISION MAKING: Stable/uncomplicated  EVALUATION COMPLEXITY: Low   GOALS: Goals reviewed with patient? No  SHORT TERM GOALS: Target date: 09/21/22 Pt to be I in HEP in order to increase his gluteal and hamstring strength by 1/2 grade to be able to come sit to stand from a higher chair without arms without difficulty.  Baseline: Goal status: IN PROGRESS  2.  Pt to be able to single leg stance for 10 seconds to decrease risk of falls.  Baseline:  Goal status: IN PROGRESS  3.  PT to be more aware of posture to be able to walk in an erect position  Baseline:  Goal status: IN PROGRESS   LONG TERM GOALS: Target date: 09/25/22  Pt strength to improve one grade to allow pt to be confident walking outside without his cane.  Baseline:  Goal status: IN PROGRESS  2.  Pt to have had no falls in the past 4 weeks  Baseline:  Goal status: IN PROGRESS  PLAN:  PT FREQUENCY: 2x/week  PT DURATION: 4 weeks  PLANNED INTERVENTIONS: Therapeutic exercises, Therapeutic activity, Neuromuscular re-education, Balance training,  Patient/Family education, and Self Care  PLAN FOR NEXT SESSION:  PT to work on strengthening of the gluteus maximus and hamstrings as well as static and dynamic balance activities.    Rayetta Humphrey, PT CLT [phone]  10:13

## 2022-09-25 ENCOUNTER — Ambulatory Visit (HOSPITAL_COMMUNITY): Payer: Medicare Other | Attending: Internal Medicine | Admitting: Physical Therapy

## 2022-09-25 DIAGNOSIS — M6281 Muscle weakness (generalized): Secondary | ICD-10-CM | POA: Diagnosis present

## 2022-09-25 DIAGNOSIS — R296 Repeated falls: Secondary | ICD-10-CM | POA: Diagnosis present

## 2022-09-25 DIAGNOSIS — R29898 Other symptoms and signs involving the musculoskeletal system: Secondary | ICD-10-CM | POA: Diagnosis present

## 2022-09-25 NOTE — Therapy (Signed)
OUTPATIENT PHYSICAL THERAPY TREATMENT   Patient Name: Nathan [name] MRN: [id number] DOB:[birth year], 87 y.o., male Today's Date: 09/25/2022 PHYSICAL THERAPY DISCHARGE SUMMARY  Visits from Start of Care: 6  Current functional level related to goals / functional outcomes: See below   Remaining deficits: See below   Education / Equipment: HEP   Patient agrees to discharge. Patient goals were partially met. Patient is being discharged due to being pleased with the current functional level.  END OF SESSION:   PT End of Session - 09/25/22 0946    Visit Number 5    Number of Visits 5   Date for PT Re-Evaluation 09/25/21    Authorization Type BCBS medicare    Progress Note Due on Visit 10    PT Start Time 0908    PT Stop Time 0946    PT Time Calculation (min) 38 min    Activity Tolerance Patient tolerated treatment well    Behavior During Therapy [facility] for tasks assessed/performed           Past Medical History:  Diagnosis Date   Anemia, normocytic normochromic    H&H of 12.3/35.5 in 01/2012   Auditory impairment    OS   Bradycardia    Evaluated by Dr. Cletus Gash in 2009   Carcinoma of prostate [facility]) 2004   Gout    Last symptomatic episode in 2006   Hyperlipidemia    Lipid profile in 01/2012:203, 475, 37, X   Hypertension    Systolic murmur    Consistent with mitral regurgitation   Tobacco abuse    0.5 pack per day; 30 pack years   Past Surgical History:  Procedure Laterality Date   bone spur Right    elbow   CATARACT EXTRACTION W/PHACO Left 07/11/2017   Procedure: CATARACT EXTRACTION PHACO AND INTRAOCULAR LENS PLACEMENT LEFT EYE;  Surgeon: Baruch Goldmann, MD;  Location: [facility];  Service: Ophthalmology;  Laterality: Left;  CDE: 7.35   CATARACT EXTRACTION W/PHACO Right 07/25/2017   Procedure: CATARACT EXTRACTION PHACO AND INTRAOCULAR LENS PLACEMENT RIGHT EYE CDE=5.32;  Surgeon: Baruch Goldmann, MD;  Location: [facility];  Service: Ophthalmology;  Laterality: Right;  right    COLONOSCOPY  10/02/2012   Procedure: COLONOSCOPY;  Surgeon: Danie Binder, MD;  Location: [facility];  Service: Endoscopy;  Laterality: N/A;  10:30 AM   PROSTATE SURGERY     Patient Active Problem List   Diagnosis Date Noted   Bradycardia with 41-50 beats per minute 08/26/2021   COVID-19 virus infection 08/26/2021   Gouty arthropathy 08/04/2019   Proteinuria 08/04/2019   Vitamin D deficiency 12/21/2012   Chronic kidney disease 02/23/2012   Fasting hyperglycemia 02/23/2012   Anemia, normocytic normochromic    Systolic murmur    Bradycardia    Hypertension    Carcinoma of prostate ([place])    Tobacco abuse    Gout 02/19/2011    PCP: Dr. Rosita Fire  REFERRING PROVIDER: Ronni Rumble   REFERRING DIAG:  Diagnosis Description  PT eval/tx for recurrent falls/abnormal gait due to muscle weakness per Rosita Fire, MD    THERAPY DIAG:  History of falling Muscle weakness Decreased balance   Rationale for Evaluation and Treatment: Rehabilitation  ONSET DATE: November of 2022  SUBJECTIVE:   SUBJECTIVE STATEMENT:  PT states that he is doing well and feels that he is ready for discharge.  He hasn't really been doing his exercises. PERTINENT HISTORY: Covid PAIN:  Are you having pain? No  PRECAUTIONS: Fall  WEIGHT  BEARING RESTRICTIONS: No  FALLS:  Has patient fallen in last 6 months? Yes. Number of falls 2  LIVING ENVIRONMENT: Lives with: lives with their family Lives in: House/apartment Stairs: Yes: External: 3 steps; on right going up Has following equipment at home: Single point cane he can go up and down the stairs  I .  OCCUPATION: retired  PLOF: Independent with household mobility with device  PATIENT GOALS: to be able to live by himself, his granddaughter is living with him at this time.   stop falling     OBJECTIVE:   DIAGNOSTIC FINDINGS: N/A   COGNITION: Overall cognitive status: Within functional limits for tasks assessed     POSTURE:  rounded shoulders, forward head, increased thoracic kyphosis, and flexed trunk    LOWER EXTREMITY MMT:  MMT Right eval Right 09/25/22 Left eval Left  09/25/22  Hip flexion _0 Hip extension 2 3- 3- 3  Hip abduction _1 Hip adduction      Hip internal rotation      Hip external rotation      Knee flexion _2 4+  Knee extension _3 Ankle dorsiflexion _4 Ankle plantarflexion      Ankle inversion      Ankle eversion       (Blank rows = not tested)    FUNCTIONAL TESTS:  30 second sit to stand: PT unable to stand up without use of UE.  With UE  7 x  (09/25/2022) no hands 5x; UE assist 8x in 30 ") 2 minute walk test:  293 ft.  Pt looking at the floor the entire time.  No assistive device  09/25/22:  382 ft without the use of the cane. Single leg stance: RT: 5"  , Lt:   11"            09/25/22 single leg stance :  RT:  20", Lt 12"   TODAY'S TREATMENT:                                                                                                                              DATE:  09/25/22: Standing extension x 10 Heel raise x 15 Squat x 15  Sit to stand x 15 Single leg stance :  x 3 B  Side stepping x 2   Bridge x 15 Knee to chest x 3 Active hamstring stretch x 3  09/11/22 Standing:  Heel raises x 15 Functional squat x 15 Side step x 2 RT with green theraband  Step up 4"step B x 15 Step down B x 10 4" step Sit to stand x 10 Single leg stance: x 2 quit due to this causing increased hip pain  Tandem stance x 3  Leg extension on bodycraft 3 Pl x 10    09/05/22 Standing:  Heel raises x 10 Functional squat x 10 Side step  x 2 RT Step up 4' step B x 15 Sit to stand x 15 Supine : Bridge x 15  Knee to chest x 3 Active hamstring stretch 3 x 30"  B Sidelying : Hip abduction x 15 B Clam x 15 B    09/03/22 Bridge 2 x 10  Clam 2 x 10 bilateral  DKTC 10 x 5 second holds with heels on red ball STS 2 x 10 UE use to transfer to standing, without UE  for return to sitting Standing hip abduction 2 x 10 bilateral  Seated Row GTB 2x 10  Standing HR 2 x 10  Standing TR 2 x 10   08/28/22: Evaluation Sit to stand x 10 Bridge x 10 Educated to attempt to stand taller.     PATIENT EDUCATION:  Education details: 09/03/22 HEP, posture Person educated: Patient Education method: Explanation and Demonstration Education comprehension: returned demonstration  HOME EXERCISE PROGRAM:           12/14:            Functional squat           Knee to chest            Active hamstring stretch   Access Code: [email or web address] URL: [email or web address]  12/12 - Standing Hip Abduction with Counter Support  - 1 x daily - 7 x weekly - 2 sets - 10 reps - Heel Raises with Counter Support  - 1 x daily - 7 x weekly - 2 sets - 10 reps  Date: 08/28/2022 - Supine Bridge  - 2 x daily - 7 x weekly - 1 sets - 10 reps - 5" hold - Sit to Stand with Counter Support  - 3 x daily - 7 x weekly - 1 sets - 10 reps  ASSESSMENT:  CLINICAL IMPRESSION:  Pt has only been to five treatments and admits that he does not complete HEP but feels that he has improved enough to stop coming and complete a HEP.  Re assessment demonstrates slight improvement of strength, improved balance and improved gait.  Pt has met 2/3 STS and 1/2 LTG>  Therapist stressed the importance of completing his HEP for optimal function. OBJECTIVE IMPAIRMENTS: decreased balance, decreased strength, and postural dysfunction.   ACTIVITY LIMITATIONS: standing, stairs, and locomotion level  PARTICIPATION LIMITATIONS: shopping and community activity  PERSONAL FACTORS: Age are also affecting patient's functional outcome.   REHAB POTENTIAL: Good  CLINICAL DECISION MAKING: Stable/uncomplicated  EVALUATION COMPLEXITY: Low   GOALS: Goals reviewed with patient? No  SHORT TERM GOALS: Target date: 09/21/22 Pt to be I in HEP in order to increase his gluteal and hamstring strength by 1/2 grade to be  able to come sit to stand from a higher chair without arms without difficulty.  Baseline: Goal status: MET  2.  Pt to be able to single leg stance for 10 seconds to decrease risk of falls.  Baseline:  Goal status: MET  3.  PT to be more aware of posture to be able to walk in an erect position  Baseline:  Goal status: IN PROGRESS   LONG TERM GOALS: Target date: 09/25/22  Pt strength to improve one grade to allow pt to be confident walking outside without his cane.  Baseline:  Goal status: IN PROGRESS  2.  Pt to have had no falls in the past 4 weeks  Baseline:  Goal status: MET  PLAN:  PT FREQUENCY: 2x/week  PT DURATION: 4 weeks  PLANNED INTERVENTIONS: Therapeutic  exercises, Therapeutic activity, Neuromuscular re-education, Balance training, Patient/Family education, and Self Care  PLAN FOR NEXT SESSION:  PT to work on strengthening of the gluteus maximus and hamstrings as well as static and dynamic balance activities.    Rayetta Humphrey, PT CLT [phone]  9:46

## 2022-10-16 ENCOUNTER — Ambulatory Visit: Payer: Medicare Other | Admitting: Cardiology

## 2022-12-09 ENCOUNTER — Ambulatory Visit: Payer: Medicare Other | Admitting: Podiatry

## 2022-12-09 VITALS — BP 112/62

## 2022-12-09 DIAGNOSIS — M79675 Pain in left toe(s): Secondary | ICD-10-CM | POA: Diagnosis not present

## 2022-12-09 DIAGNOSIS — L84 Corns and callosities: Secondary | ICD-10-CM | POA: Diagnosis not present

## 2022-12-09 DIAGNOSIS — B351 Tinea unguium: Secondary | ICD-10-CM

## 2022-12-09 DIAGNOSIS — I739 Peripheral vascular disease, unspecified: Secondary | ICD-10-CM

## 2022-12-09 DIAGNOSIS — M79674 Pain in right toe(s): Secondary | ICD-10-CM

## 2022-12-09 NOTE — Progress Notes (Unsigned)
  Subjective:  Patient ID: Nathan [name], male    DOB: 03-16-1936,  MRN: [id number]  Nathan [name] presents to clinic today for {jgcomplaint:23593}  Chief Complaint  Patient presents with   Nail Problem    RFC PCP-Fanta PCP VST-08/2022   New problem(s): None. {jgcomplaint:23593}  PCP is Fanta, Normajean Baxter, MD.  No Known Allergies  Review of Systems: Negative except as noted in the HPI.  Objective: No changes noted in today's physical examination. Vitals:   12/09/22 1424  BP: 112/62   Nathan [name] is a pleasant 87 y.o. male {jgbodyhabitus:24098} AAO x 3.  Vascular Examination: Vascular status intact b/l with faintly palpable DP pulses. PT pulses diminished b/l. Pedal hair absent b/l. CFT <3 seconds b/l. No edema. No pain with calf compression b/l. Skin temperature gradient WNL b/l.   Neurological Examination: Sensation grossly intact b/l with 10 gram monofilament. Vibratory sensation intact b/l.   Dermatological Examination: Pedal skin is thin and atrophic b/l.  Toenails 1-5 b/l thick, discolored, elongated with subungual debris and pain on dorsal palpation.   Hyperkeratotic lesion(s) submet head 1 b/l and submet head 5 left foot.  No erythema, no edema, no drainage, no fluctuance.   Diffuse scaling noted peripherally and plantarly b/l feet.  No interdigital macerations.  No blisters, no weeping. No signs of secondary bacterial infection noted.  Musculoskeletal Examination: Normal muscle strength 5/5 to all lower extremity muscle groups bilaterally. Hammertoe deformity noted 2-5 b/l.Marland Kitchen No pain, crepitus or joint limitation noted with ROM b/l LE.  Patient ambulates independently without assistive aids. Plantar fat pad atrophy of forefoot area b/l lower extremities.  Radiographs: None . Assessment/Plan: No diagnosis found.  No orders of the defined types were placed in this encounter.   None {Jgplan:23602::"-Patient/POA to call should there be  question/concern in the interim."}   No follow-ups on file.  Marzetta Board, DPM

## 2022-12-10 ENCOUNTER — Encounter: Payer: Self-pay | Admitting: Podiatry

## 2022-12-12 ENCOUNTER — Emergency Department (HOSPITAL_COMMUNITY): Payer: Medicare Other

## 2022-12-12 ENCOUNTER — Encounter (HOSPITAL_COMMUNITY): Payer: Self-pay

## 2022-12-12 ENCOUNTER — Inpatient Hospital Stay (HOSPITAL_COMMUNITY)
Admission: EM | Admit: 2022-12-12 | Discharge: 2022-12-23 | DRG: 064 | Disposition: E | Payer: Medicare Other | Source: Other Acute Inpatient Hospital | Attending: Pulmonary Disease | Admitting: Pulmonary Disease

## 2022-12-12 DIAGNOSIS — R001 Bradycardia, unspecified: Secondary | ICD-10-CM | POA: Diagnosis present

## 2022-12-12 DIAGNOSIS — H9190 Unspecified hearing loss, unspecified ear: Secondary | ICD-10-CM | POA: Diagnosis present

## 2022-12-12 DIAGNOSIS — G936 Cerebral edema: Secondary | ICD-10-CM | POA: Diagnosis present

## 2022-12-12 DIAGNOSIS — N1832 Chronic kidney disease, stage 3b: Secondary | ICD-10-CM | POA: Diagnosis present

## 2022-12-12 DIAGNOSIS — I651 Occlusion and stenosis of basilar artery: Secondary | ICD-10-CM | POA: Diagnosis present

## 2022-12-12 DIAGNOSIS — Z66 Do not resuscitate: Secondary | ICD-10-CM | POA: Diagnosis not present

## 2022-12-12 DIAGNOSIS — Z515 Encounter for palliative care: Secondary | ICD-10-CM | POA: Diagnosis not present

## 2022-12-12 DIAGNOSIS — I340 Nonrheumatic mitral (valve) insufficiency: Secondary | ICD-10-CM | POA: Diagnosis present

## 2022-12-12 DIAGNOSIS — N189 Chronic kidney disease, unspecified: Secondary | ICD-10-CM | POA: Diagnosis not present

## 2022-12-12 DIAGNOSIS — N179 Acute kidney failure, unspecified: Secondary | ICD-10-CM | POA: Diagnosis present

## 2022-12-12 DIAGNOSIS — D631 Anemia in chronic kidney disease: Secondary | ICD-10-CM | POA: Diagnosis present

## 2022-12-12 DIAGNOSIS — F17210 Nicotine dependence, cigarettes, uncomplicated: Secondary | ICD-10-CM | POA: Diagnosis present

## 2022-12-12 DIAGNOSIS — I63532 Cerebral infarction due to unspecified occlusion or stenosis of left posterior cerebral artery: Secondary | ICD-10-CM | POA: Diagnosis present

## 2022-12-12 DIAGNOSIS — I6522 Occlusion and stenosis of left carotid artery: Secondary | ICD-10-CM | POA: Diagnosis present

## 2022-12-12 DIAGNOSIS — E8722 Chronic metabolic acidosis: Secondary | ICD-10-CM | POA: Diagnosis present

## 2022-12-12 DIAGNOSIS — J189 Pneumonia, unspecified organism: Secondary | ICD-10-CM

## 2022-12-12 DIAGNOSIS — E8881 Metabolic syndrome: Secondary | ICD-10-CM | POA: Diagnosis present

## 2022-12-12 DIAGNOSIS — I129 Hypertensive chronic kidney disease with stage 1 through stage 4 chronic kidney disease, or unspecified chronic kidney disease: Secondary | ICD-10-CM | POA: Diagnosis present

## 2022-12-12 DIAGNOSIS — Z1152 Encounter for screening for COVID-19: Secondary | ICD-10-CM

## 2022-12-12 DIAGNOSIS — I63512 Cerebral infarction due to unspecified occlusion or stenosis of left middle cerebral artery: Principal | ICD-10-CM | POA: Diagnosis present

## 2022-12-12 DIAGNOSIS — I6389 Other cerebral infarction: Secondary | ICD-10-CM | POA: Diagnosis not present

## 2022-12-12 DIAGNOSIS — Z716 Tobacco abuse counseling: Secondary | ICD-10-CM

## 2022-12-12 DIAGNOSIS — J9601 Acute respiratory failure with hypoxia: Secondary | ICD-10-CM | POA: Diagnosis present

## 2022-12-12 DIAGNOSIS — M109 Gout, unspecified: Secondary | ICD-10-CM | POA: Diagnosis present

## 2022-12-12 DIAGNOSIS — F0390 Unspecified dementia without behavioral disturbance: Secondary | ICD-10-CM | POA: Diagnosis present

## 2022-12-12 DIAGNOSIS — I639 Cerebral infarction, unspecified: Secondary | ICD-10-CM | POA: Diagnosis present

## 2022-12-12 DIAGNOSIS — R258 Other abnormal involuntary movements: Secondary | ICD-10-CM | POA: Diagnosis present

## 2022-12-12 DIAGNOSIS — G935 Compression of brain: Secondary | ICD-10-CM | POA: Diagnosis present

## 2022-12-12 DIAGNOSIS — I63412 Cerebral infarction due to embolism of left middle cerebral artery: Secondary | ICD-10-CM | POA: Diagnosis not present

## 2022-12-12 DIAGNOSIS — E785 Hyperlipidemia, unspecified: Secondary | ICD-10-CM | POA: Diagnosis present

## 2022-12-12 DIAGNOSIS — Z961 Presence of intraocular lens: Secondary | ICD-10-CM | POA: Diagnosis present

## 2022-12-12 DIAGNOSIS — Z8546 Personal history of malignant neoplasm of prostate: Secondary | ICD-10-CM

## 2022-12-12 DIAGNOSIS — G9341 Metabolic encephalopathy: Secondary | ICD-10-CM | POA: Diagnosis present

## 2022-12-12 DIAGNOSIS — Z7982 Long term (current) use of aspirin: Secondary | ICD-10-CM

## 2022-12-12 DIAGNOSIS — I6322 Cerebral infarction due to unspecified occlusion or stenosis of basilar arteries: Secondary | ICD-10-CM | POA: Diagnosis not present

## 2022-12-12 DIAGNOSIS — Z79899 Other long term (current) drug therapy: Secondary | ICD-10-CM

## 2022-12-12 DIAGNOSIS — Z8249 Family history of ischemic heart disease and other diseases of the circulatory system: Secondary | ICD-10-CM

## 2022-12-12 DIAGNOSIS — Z7189 Other specified counseling: Secondary | ICD-10-CM | POA: Diagnosis not present

## 2022-12-12 LAB — COMPREHENSIVE METABOLIC PANEL
ALT: 11 U/L (ref 0–44)
AST: 24 U/L (ref 15–41)
Albumin: 4.3 g/dL (ref 3.5–5.0)
Alkaline Phosphatase: 62 U/L (ref 38–126)
Anion gap: 10 (ref 5–15)
BUN: 34 mg/dL — ABNORMAL HIGH (ref 8–23)
CO2: 23 mmol/L (ref 22–32)
Calcium: 9 mg/dL (ref 8.9–10.3)
Chloride: 105 mmol/L (ref 98–111)
Creatinine, Ser: 2.05 mg/dL — ABNORMAL HIGH (ref 0.61–1.24)
GFR, Estimated: 31 mL/min — ABNORMAL LOW (ref >60)
Glucose, Bld: 123 mg/dL — ABNORMAL HIGH (ref 70–99)
Potassium: 4.2 mmol/L (ref 3.5–5.1)
Sodium: 138 mmol/L (ref 135–145)
Total Bilirubin: 0.8 mg/dL (ref 0.3–1.2)
Total Protein: 7.6 g/dL (ref 6.5–8.1)

## 2022-12-12 LAB — CBC WITH DIFFERENTIAL/PLATELET
Abs Immature Granulocytes: 0.03 10*3/uL (ref 0.00–0.07)
Basophils Absolute: 0 10*3/uL (ref 0.0–0.1)
Basophils Relative: 1 %
Eosinophils Absolute: 0 10*3/uL (ref 0.0–0.5)
Eosinophils Relative: 0 %
HCT: 35.2 % — ABNORMAL LOW (ref 39.0–52.0)
Hemoglobin: 11.7 g/dL — ABNORMAL LOW (ref 13.0–17.0)
Immature Granulocytes: 1 %
Lymphocytes Relative: 15 %
Lymphs Abs: 0.8 10*3/uL (ref 0.7–4.0)
MCH: 28.6 pg (ref 26.0–34.0)
MCHC: 33.2 g/dL (ref 30.0–36.0)
MCV: 86.1 fL (ref 80.0–100.0)
Monocytes Absolute: 0.4 10*3/uL (ref 0.1–1.0)
Monocytes Relative: 9 %
Neutro Abs: 3.7 10*3/uL (ref 1.7–7.7)
Neutrophils Relative %: 74 %
Platelets: 176 10*3/uL (ref 150–400)
RBC: 4.09 MIL/uL — ABNORMAL LOW (ref 4.22–5.81)
RDW: 15.8 % — ABNORMAL HIGH (ref 11.5–15.5)
WBC: 4.9 10*3/uL (ref 4.0–10.5)
nRBC: 0 % (ref 0.0–0.2)

## 2022-12-12 LAB — AMMONIA: Ammonia: 14 umol/L (ref 9–35)

## 2022-12-12 LAB — RESP PANEL BY RT-PCR (RSV, FLU A&B, COVID)  RVPGX2
Influenza A by PCR: NEGATIVE
Influenza B by PCR: NEGATIVE
Resp Syncytial Virus by PCR: NEGATIVE
SARS Coronavirus 2 by RT PCR: NEGATIVE

## 2022-12-12 LAB — URINALYSIS, W/ REFLEX TO CULTURE (INFECTION SUSPECTED)
Bilirubin Urine: NEGATIVE
Glucose, UA: NEGATIVE mg/dL
Ketones, ur: NEGATIVE mg/dL
Leukocytes,Ua: NEGATIVE
Nitrite: POSITIVE — AB
Protein, ur: 30 mg/dL — AB
Specific Gravity, Urine: 1.013 (ref 1.005–1.030)
pH: 7 (ref 5.0–8.0)

## 2022-12-12 LAB — BLOOD GAS, ARTERIAL
Acid-base deficit: 7.1 mmol/L — ABNORMAL HIGH (ref 0.0–2.0)
Bicarbonate: 17.5 mmol/L — ABNORMAL LOW (ref 20.0–28.0)
Drawn by: 4627
O2 Saturation: 99.8 %
Patient temperature: 37
pCO2 arterial: 31 mmHg — ABNORMAL LOW (ref 32–48)
pH, Arterial: 7.36 (ref 7.35–7.45)
pO2, Arterial: 146 mmHg — ABNORMAL HIGH (ref 83–108)

## 2022-12-12 LAB — BRAIN NATRIURETIC PEPTIDE: B Natriuretic Peptide: 596 pg/mL — ABNORMAL HIGH (ref 0.0–100.0)

## 2022-12-12 LAB — BLOOD GAS, VENOUS
Acid-Base Excess: 0 mmol/L (ref 0.0–2.0)
Bicarbonate: 26.4 mmol/L (ref 20.0–28.0)
Drawn by: 63883
O2 Saturation: 50.9 %
Patient temperature: 37.4
pCO2, Ven: 50 mmHg (ref 44–60)
pH, Ven: 7.33 (ref 7.25–7.43)
pO2, Ven: 33 mmHg (ref 32–45)

## 2022-12-12 LAB — PROTIME-INR
INR: 1.2 (ref 0.8–1.2)
Prothrombin Time: 14.6 seconds (ref 11.4–15.2)

## 2022-12-12 LAB — CBG MONITORING, ED: Glucose-Capillary: 116 mg/dL — ABNORMAL HIGH (ref 70–99)

## 2022-12-12 LAB — LACTIC ACID, PLASMA
Lactic Acid, Venous: 2.9 mmol/L (ref 0.5–1.9)
Lactic Acid, Venous: 2.7 mmol/L (ref 0.5–1.9)

## 2022-12-12 LAB — APTT: aPTT: 33 seconds (ref 24–36)

## 2022-12-12 LAB — GLUCOSE, CAPILLARY: Glucose-Capillary: 110 mg/dL — ABNORMAL HIGH (ref 70–99)

## 2022-12-12 LAB — ETHANOL: Alcohol, Ethyl (B): <10 mg/dL (ref <10)

## 2022-12-12 MED ORDER — SODIUM CHLORIDE 0.9 % IV SOLN
500.0000 mg | INTRAVENOUS | Status: DC
  Administered 2022-12-12: 500 mg via INTRAVENOUS
  Filled 2022-12-12 (×2): qty 5

## 2022-12-12 MED ORDER — SODIUM CHLORIDE 0.9 % IV SOLN
2.0000 g | INTRAVENOUS | Status: DC
  Administered 2022-12-12: 2 g via INTRAVENOUS
  Filled 2022-12-12: qty 20

## 2022-12-12 MED ORDER — CHLORHEXIDINE GLUCONATE CLOTH 2 % EX PADS
6.0000 | MEDICATED_PAD | Freq: Every day | CUTANEOUS | Status: DC
  Administered 2022-12-12: 6 via TOPICAL

## 2022-12-12 MED ORDER — SUCCINYLCHOLINE CHLORIDE 200 MG/10ML IV SOSY
100.0000 mg | PREFILLED_SYRINGE | Freq: Once | INTRAVENOUS | Status: AC
  Administered 2022-12-12: 100 mg via INTRAVENOUS
  Filled 2022-12-12: qty 10

## 2022-12-12 MED ORDER — ETOMIDATE 2 MG/ML IV SOLN
20.0000 mg | Freq: Once | INTRAVENOUS | Status: AC
  Administered 2022-12-12: 20 mg via INTRAVENOUS
  Filled 2022-12-12: qty 10

## 2022-12-12 MED ORDER — LACTATED RINGERS IV SOLN: INTRAVENOUS | Status: DC

## 2022-12-12 MED ORDER — PROPOFOL 1000 MG/100ML IV EMUL
5.0000 ug/kg/min | INTRAVENOUS | Status: DC
  Administered 2022-12-12: 5 ug/kg/min via INTRAVENOUS
  Filled 2022-12-12 (×2): qty 100

## 2022-12-12 MED ORDER — LACTATED RINGERS IV BOLUS (SEPSIS)
1000.0000 mL | Freq: Once | INTRAVENOUS | Status: AC
  Administered 2022-12-12: 1000 mL via INTRAVENOUS

## 2022-12-12 NOTE — ED Notes (Addendum)
Critical lab notification Lactic acid 2.9, provider Melina Copa MD notified

## 2022-12-12 NOTE — ED Notes (Addendum)
RT, EDP, EDPA, 2 Rns present for intubation  2021 20mg  of etomidate given 2021 100mg  of succinylcholine given  Dentures removed prior to placing ett tube.   2023 7.5 ett placed by EDPA with positive color change; 24cm at the lip; bilateral breath sounds heard by RT  2025 OG placed by EDP; 24fr 72cm at the lip; secured to ett tube with tape  Placement of ett and OG verified via xray

## 2022-12-12 NOTE — Progress Notes (Signed)
Pt arrived from [facility] via [facility] with the following personal belongings:  - 1 yellow/silver colored watch  Watch was removed and place in specimen cup with patient label. No family currently at bedside.

## 2022-12-12 NOTE — H&P (Incomplete)
NAME:  Nathan [name], MRN:  [id number], DOB:  February 26, 1936, LOS: 0 ADMISSION DATE:  12/03/2022, CONSULTATION DATE:  3/21 REFERRING MD:  Dr. Melina Copa EDP, CHIEF COMPLAINT:  Stroke   History of Present Illness:  87 year old male with past medical history as below, which is significant for hypertension, chronic sinus bradycardia, CKD 3, prostate cancer, gout, and hyperlipidemia.  He was last known well at approximately 10 AM on 3/21, however, later that day he was found to be unresponsive while sitting in his car.  He was transferred to [facility] the ED by EMS.  He was unable to provide any history in the emergency department was found to be hypoxemic.  He was intubated.  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  Laboratory evaluation significant for BUN 34, creatinine 2.05, BNP 596, lactic acid 2.9, hemoglobin 11.7.  Neurology was remotely consulted and recommended transfer to [facility] for ICU admission.  PCCM asked to admit.  Pertinent  Medical History   has a past medical history of Anemia, normocytic normochromic, Auditory impairment, Bradycardia, Carcinoma of prostate ([place]) (2004), Gout, Hyperlipidemia, Hypertension, Systolic murmur, and Tobacco abuse.   Significant Hospital Events: Including procedures, antibiotic start and stop dates in addition to other pertinent events   3/22 present to [facility] with stroke. Intubated. Tx to [facility].   Interim History / Subjective:    Objective   Blood pressure (!) 154/70, pulse (!) 54, temperature 97.7 F (36.5 C), temperature source Oral, resp. rate (!) 24, height 5\' 9"  (1.753 m), weight 61.8 kg, SpO2 99 %.    Vent Mode: PRVC FiO2 (%):  [40 %-100 %] 40 % Set Rate:  [18 bmp-28 bmp] 18 bmp Vt Set:  [570 mL] 570 mL PEEP:  [5 cmH20] 5 cmH20 Plateau Pressure:  [15 cmH20-29 cmH20] 15 cmH20   Intake/Output Summary (Last 24 hours) at 12/22/2022 2356 Last data filed at 11/24/2022 2253 Gross per 24 hour  Intake  5.67 ml  Output --  Net 5.67 ml   Filed Weights   12/17/2022 1737 12/19/2022 2339  Weight: 64.4 kg 61.8 kg    Examination: General: elderly appearing male in NAD on vent HENT: [place]/AT, PERRL, no JVDV Lungs: *** Cardiovascular: *** Abdomen: *** Extremities: *** Neuro: *** GU: ***  Resolved Hospital Problem list   ***  Assessment & Plan:  ***  Best Practice (right click and "Reselect all SmartList Selections" daily)   Diet/type: {diet type:25684} DVT prophylaxis: {anticoagulation (Optional):25687} GI prophylaxis: [id number] Lines: {Central Venous Access:25771} Foley:  {Central Venous Access:25691} Code Status:  {Code Status:26939} Last date of multidisciplinary goals of care discussion [***]  Labs   CBC: Recent Labs  Lab 12/20/2022 1741  WBC 4.9  NEUTROABS 3.7  HGB 11.7*  HCT 35.2*  MCV 86.1  PLT 0000000    Basic Metabolic Panel: Recent Labs  Lab 12/22/2022 1741  NA 138  K 4.2  CL 105  CO2 23  GLUCOSE 123*  BUN 34*  CREATININE 2.05*  CALCIUM 9.0   GFR: Estimated Creatinine Clearance: 23.4 mL/min (A) (by C-G formula based on SCr of 2.05 mg/dL (H)). Recent Labs  Lab 11/28/2022 1741 12/04/2022 1859  WBC 4.9  --   LATICACIDVEN 2.9* 2.7*    Liver Function Tests: Recent Labs  Lab 12/09/2022 1741  AST 24  ALT 11  ALKPHOS 62  BILITOT 0.8  PROT 7.6  ALBUMIN 4.3   No results for input(s): "LIPASE", "AMYLASE" in the last 168 hours.  Recent Labs  Lab 12/16/2022 1741  AMMONIA 14    ABG    Component Value Date/Time   PHART 7.36 12/04/2022 2045   PCO2ART 31 (L) 12/15/2022 2045   PO2ART 146 (H) 12/03/2022 2045   HCO3 17.5 (L) 12/15/2022 2045   ACIDBASEDEF 7.1 (H) 12/17/2022 2045   O2SAT 99.8 12/01/2022 2045     Coagulation Profile: Recent Labs  Lab 12/05/2022 1741  INR 1.2    Cardiac Enzymes: No results for input(s): "CKTOTAL", "CKMB", "CKMBINDEX", "TROPONINI" in the last 168 hours.  HbA1C: No results found for: "HGBA1C"  CBG: Recent Labs  Lab  12/13/2022 1729 12/05/2022 2338  GLUCAP 116* 110*    Review of Systems:   ***  Past Medical History:  He,  has a past medical history of Anemia, normocytic normochromic, Auditory impairment, Bradycardia, Carcinoma of prostate ([place]) (2004), Gout, Hyperlipidemia, Hypertension, Systolic murmur, and Tobacco abuse.   Surgical History:   Past Surgical History:  Procedure Laterality Date  . bone spur Right    elbow  . CATARACT EXTRACTION W/PHACO Left 07/11/2017   Procedure: CATARACT EXTRACTION PHACO AND INTRAOCULAR LENS PLACEMENT LEFT EYE;  Surgeon: Baruch Goldmann, MD;  Location: [facility];  Service: Ophthalmology;  Laterality: Left;  CDE: 7.35  . CATARACT EXTRACTION W/PHACO Right 07/25/2017   Procedure: CATARACT EXTRACTION PHACO AND INTRAOCULAR LENS PLACEMENT RIGHT EYE CDE=5.32;  Surgeon: Baruch Goldmann, MD;  Location: [facility];  Service: Ophthalmology;  Laterality: Right;  right  . COLONOSCOPY  10/02/2012   Procedure: COLONOSCOPY;  Surgeon: Danie Binder, MD;  Location[facility];  Service: Endoscopy;  Laterality: N/A;  10:30 AM  . PROSTATE SURGERY       Social History:   reports that he has been smoking cigarettes. He started smoking about 64 years ago. He has a 27.50 pack-year smoking history. He has never used smokeless tobacco. He reports that he does not currently use alcohol after a past usage of about 4.0 standard drinks of alcohol per week. He reports that he does not use drugs.   Family History:  His family history includes Arrhythmia in his mother; Heart disease in his mother. There is no history of Coronary artery disease or Colon cancer.   Allergies No Known Allergies   Home Medications  Prior to Admission medications   Medication Sig Start Date End Date Taking? Authorizing Provider  allopurinol (ZYLOPRIM) 100 MG tablet Take 200 mg by mouth daily. 08/13/21  Yes [provider]  ibuprofen (ADVIL) 800 MG tablet Take 800 mg by mouth every 8 (eight) hours as needed.  10/29/22  Yes [provider]  lisinopril (ZESTRIL) 5 MG tablet Take 5 mg by mouth daily.   Yes [provider]  simvastatin (ZOCOR) 20 MG tablet TAKE 1 TABLET BY MOUTH EVERYDAY AT BEDTIME Patient taking differently: Take 20 mg by mouth daily. 12/17/21  Yes Strader, Fransisco Hertz, PA-C  sodium bicarbonate 650 MG tablet Take 650-1,300 mg by mouth 2 (two) times daily. Takes 2 tabs in the morning and 1 in the evening   Yes [provider]  aspirin EC 81 MG tablet Take 81 mg by mouth daily.    [provider]  cholecalciferol (VITAMIN D) 1000 units tablet Take 1,000 Units by mouth 3 (three) times a week.     [provider]  ketoconazole (NIZORAL) 2 % cream Apply to both feet and between toes once daily for 6 weeks. Patient not taking: Reported on 11/23/2022 08/20/22   Marzetta Board,  DPM     Critical care time: ***

## 2022-12-12 NOTE — Sepsis Progress Note (Signed)
Elink following code sepsis °

## 2022-12-12 NOTE — ED Notes (Signed)
ED Provider at bedside. 

## 2022-12-12 NOTE — H&P (Addendum)
NAME:  Nathan [name], MRN:  [id number], DOB:  1935/12/25, LOS: 0 ADMISSION DATE:  11/24/2022, CONSULTATION DATE:  3/21 REFERRING MD:  Dr. Melina Copa EDP, CHIEF COMPLAINT:  Stroke   History of Present Illness:  87 year old male with past medical history as below, which is significant for hypertension, chronic sinus bradycardia, CKD 3, prostate cancer, gout, and hyperlipidemia.  He was last known well at approximately 10 AM on 3/21, however, later that day he was found to be unresponsive while sitting in his car.  He was transferred to [facility] the ED by EMS.  He was unable to provide any history in the emergency department was found to be hypoxemic.  He was intubated.  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  Laboratory evaluation significant for BUN 34, creatinine 2.05, BNP 596, lactic acid 2.9, hemoglobin 11.7.  Neurology was remotely consulted and recommended transfer to [facility] for ICU admission.  PCCM asked to admit.  Pertinent  Medical History   has a past medical history of Anemia, normocytic normochromic, Auditory impairment, Bradycardia, Carcinoma of prostate ([place]) (2004), Gout, Hyperlipidemia, Hypertension, Systolic murmur, and Tobacco abuse.   Significant Hospital Events: Including procedures, antibiotic start and stop dates in addition to other pertinent events   3/22 present to [facility] with stroke. Intubated. Tx to [facility].   Interim History / Subjective:    Objective   Blood pressure (!) 154/70, pulse (!) 54, temperature 97.7 F (36.5 C), temperature source Oral, resp. rate (!) 24, height 5\' 9"  (1.753 m), weight 61.8 kg, SpO2 99 %.    Vent Mode: PRVC FiO2 (%):  [40 %-100 %] 40 % Set Rate:  [18 bmp-28 bmp] 18 bmp Vt Set:  [570 mL] 570 mL PEEP:  [5 cmH20] 5 cmH20 Plateau Pressure:  [15 cmH20-29 cmH20] 15 cmH20   Intake/Output Summary (Last 24 hours) at 12/22/2022 2356 Last data filed at 12/22/2022 2253 Gross per 24 hour  Intake  5.67 ml  Output --  Net 5.67 ml   Filed Weights   12/10/2022 1737 11/25/2022 2339  Weight: 64.4 kg 61.8 kg    Examination: General: elderly appearing male in NAD on vent HENT: [place]/AT, PERRL, no JVD Lungs: Clear bilateral breath sounds Cardiovascular: Bradycardic (50s), regular, no MRG Abdomen: Soft, NT, ND Extremities: No acute deformity Neuro: Examined on propofol 10. No eye opening. + gag. Flexion to pain x 4 extremities.   CT head: large left MCA and PCA territory infarct.  CT chest/abd: Bilateral PNA L>R  Resolved Hospital Problem list     Assessment & Plan:   Acute stroke: Left MCA and PCA stroke visible on initial CT - Neurology consulted, appreciate recommendations  - MRI brain - Hypertonic saline - Echocardiograms - Serial neurochecks - Minimize sedation.  - CBG monitoring goal 140-180  Acute respiratory failure with hypoxia Pneumonia: likely aspiration - Full vent support - ABG reviewed - CAP coverage continue - Blood and sputum cultures pending - Daily SAT/SBT  AKI on CKD 3b - Gentle hydration - Trend renal indices and UOP - Repeat chemistry in the morning  Chronic metabolic acidosis: per outpatient nephrology notes - Holding PO bicarb for now  Bradycardia: chronic - telemetry monitoring - avoid rate lowering medications   Best Practice (right click and "Reselect all SmartList Selections" daily)   Diet/type: NPO DVT prophylaxis: LMWH GI prophylaxis: H2B Lines: N/A Foley:  N/A Code Status:  full code Last date of multidisciplinary goals of care discussion [  ]  Labs   CBC: Recent Labs  Lab 12/18/2022 1741  WBC 4.9  NEUTROABS 3.7  HGB 11.7*  HCT 35.2*  MCV 86.1  PLT 0000000    Basic Metabolic Panel: Recent Labs  Lab 12/03/2022 1741  NA 138  K 4.2  CL 105  CO2 23  GLUCOSE 123*  BUN 34*  CREATININE 2.05*  CALCIUM 9.0   GFR: Estimated Creatinine Clearance: 23.4 mL/min (A) (by C-G formula based on SCr of 2.05 mg/dL (H)). Recent Labs   Lab 12/13/2022 1741 12/01/2022 1859  WBC 4.9  --   LATICACIDVEN 2.9* 2.7*    Liver Function Tests: Recent Labs  Lab 12/07/2022 1741  AST 24  ALT 11  ALKPHOS 62  BILITOT 0.8  PROT 7.6  ALBUMIN 4.3   No results for input(s): "LIPASE", "AMYLASE" in the last 168 hours. Recent Labs  Lab 11/22/2022 1741  AMMONIA 14    ABG    Component Value Date/Time   PHART 7.36 12/22/2022 2045   PCO2ART 31 (L) 12/18/2022 2045   PO2ART 146 (H) 12/19/2022 2045   HCO3 17.5 (L) 11/26/2022 2045   ACIDBASEDEF 7.1 (H) 12/17/2022 2045   O2SAT 99.8 12/22/2022 2045     Coagulation Profile: Recent Labs  Lab 11/27/2022 1741  INR 1.2    Cardiac Enzymes: No results for input(s): "CKTOTAL", "CKMB", "CKMBINDEX", "TROPONINI" in the last 168 hours.  HbA1C: No results found for: "HGBA1C"  CBG: Recent Labs  Lab 12/13/2022 1729 12/21/2022 2338  GLUCAP 116* 110*    Review of Systems:   Patient is encephalopathic and/or intubated; therefore, history has been obtained from chart review.    Past Medical History:  He,  has a past medical history of Anemia, normocytic normochromic, Auditory impairment, Bradycardia, Carcinoma of prostate ([place]) (2004), Gout, Hyperlipidemia, Hypertension, Systolic murmur, and Tobacco abuse.   Surgical History:   Past Surgical History:  Procedure Laterality Date   bone spur Right    elbow   CATARACT EXTRACTION W/PHACO Left 07/11/2017   Procedure: CATARACT EXTRACTION PHACO AND INTRAOCULAR LENS PLACEMENT LEFT EYE;  Surgeon: Baruch Goldmann, MD;  Location: [facility];  Service: Ophthalmology;  Laterality: Left;  CDE: 7.35   CATARACT EXTRACTION W/PHACO Right 07/25/2017   Procedure: CATARACT EXTRACTION PHACO AND INTRAOCULAR LENS PLACEMENT RIGHT EYE CDE=5.32;  Surgeon: Baruch Goldmann, MD;  Location: [facility];  Service: Ophthalmology;  Laterality: Right;  right   COLONOSCOPY  10/02/2012   Procedure: COLONOSCOPY;  Surgeon: Danie Binder, MD;  Location: [facility];  Service: Endoscopy;   Laterality: N/A;  10:30 AM   PROSTATE SURGERY       Social History:   reports that he has been smoking cigarettes. He started smoking about 64 years ago. He has a 27.50 pack-year smoking history. He has never used smokeless tobacco. He reports that he does not currently use alcohol after a past usage of about 4.0 standard drinks of alcohol per week. He reports that he does not use drugs.   Family History:  His family history includes Arrhythmia in his mother; Heart disease in his mother. There is no history of Coronary artery disease or Colon cancer.   Allergies No Known Allergies   Home Medications  Prior to Admission medications   Medication Sig Start Date End Date Taking? Authorizing Provider  allopurinol (ZYLOPRIM) 100 MG tablet Take 200 mg by mouth daily. 08/13/21  Yes [provider]  ibuprofen (ADVIL) 800 MG tablet Take 800 mg by mouth every 8 (eight) hours as needed.  10/29/22  Yes [provider]  lisinopril (ZESTRIL) 5 MG tablet Take 5 mg by mouth daily.   Yes [provider]  simvastatin (ZOCOR) 20 MG tablet TAKE 1 TABLET BY MOUTH EVERYDAY AT BEDTIME Patient taking differently: Take 20 mg by mouth daily. 12/17/21  Yes Strader, Fransisco Hertz, PA-C  sodium bicarbonate 650 MG tablet Take 650-1,300 mg by mouth 2 (two) times daily. Takes 2 tabs in the morning and 1 in the evening   Yes [provider]  aspirin EC 81 MG tablet Take 81 mg by mouth daily.    [provider]  cholecalciferol (VITAMIN D) 1000 units tablet Take 1,000 Units by mouth 3 (three) times a week.     [provider]  ketoconazole (NIZORAL) 2 % cream Apply to both feet and between toes once daily for 6 weeks. Patient not taking: Reported on 12/09/2022 08/20/22   Marzetta Board, DPM     Critical care time: 38 minutes     Georgann Housekeeper, AGACNP-BC [facility]  See Amion for personal pager PCCM on call pager [phone] until  7pm. Please call Elink 7p-7a. [phone]  12/13/2022 12:31 AM

## 2022-12-12 NOTE — ED Triage Notes (Signed)
Patient BIB by EMS from home, found non-responsive by family. Patient believed to have possible UTI. O2 Sats in the 80s put on 4L by EMS.

## 2022-12-12 NOTE — ED Notes (Signed)
[facility] present to transport pt to [facility] 

## 2022-12-12 NOTE — ED Provider Notes (Signed)
[facility] Provider Note   CSN: [id number] Arrival date & time: 12/22/2022  1715     History  No chief complaint on file.   Nathan [name] is a 87 y.o. male.  Level 5 caveat for altered mental status.  Patient brought in by EMS from home after being found unresponsive sitting in his chair.  Patient unable to give any history.  EMS reports patient was at baseline per family this morning 10am.  Patient unable to give any history.  Was noted to be febrile with low oxygen saturations and placed on oxygen by EMS.  No other history available at this time.  The history is provided by the EMS personnel.  Altered Mental Status Presenting symptoms: unresponsiveness   Most recent episode:  Today Timing:  Constant Progression:  Unchanged Chronicity:  New      Home Medications Prior to Admission medications   Medication Sig Start Date End Date Taking? Authorizing Provider  allopurinol (ZYLOPRIM) 100 MG tablet Take 200 mg by mouth daily. 08/13/21   [provider]  aspirin EC 81 MG tablet Take 81 mg by mouth daily.    [provider]  cholecalciferol (VITAMIN D) 1000 units tablet Take 1,000 Units by mouth 3 (three) times a week.     [provider]  ketoconazole (NIZORAL) 2 % cream Apply to both feet and between toes once daily for 6 weeks. 08/20/22   Marzetta Board, DPM  lisinopril (ZESTRIL) 5 MG tablet Take 5 mg by mouth daily.    [provider]  simvastatin (ZOCOR) 20 MG tablet TAKE 1 TABLET BY MOUTH EVERYDAY AT BEDTIME 12/17/21   Strader, [place] M, PA-C  sodium bicarbonate 650 MG tablet Take 650-1,300 mg by mouth 2 (two) times daily. Takes 2 tabs in the morning and 1 in the evening    [provider]      Allergies    Patient has no known allergies.    Review of Systems   Review of Systems  Unable to perform ROS: Mental status change    Physical Exam Updated Vital Signs BP (!)  141/77 (BP Location: Left Arm)   Pulse (!) 55   Temp 98.8 F (37.1 C) (Axillary)   Resp (!) 26   Ht 5\' 9"  (1.753 m)   Wt 61.8 kg   SpO2 98%   BMI 20.12 kg/m  Physical Exam Vitals and nursing note reviewed.  Constitutional:      General: He is in acute distress.     Appearance: He is well-developed. He is ill-appearing.  HENT:     Head: Normocephalic and atraumatic.  Eyes:     Conjunctiva/sclera: Conjunctivae normal.  Cardiovascular:     Rate and Rhythm: Regular rhythm. Tachycardia present.     Heart sounds: No murmur heard. Pulmonary:     Effort: Accessory muscle usage present. No respiratory distress.     Breath sounds: Rhonchi and rales present.  Abdominal:     Palpations: Abdomen is soft.     Tenderness: There is no abdominal tenderness. There is no guarding or rebound.  Musculoskeletal:        General: No deformity.     Cervical back: Neck supple.  Skin:    General: Skin is warm and dry.     Capillary Refill: Capillary refill takes less than 2 seconds.  Neurological:     Comments: Patient not responding to voice.  He will withdraw and localize stimulation of  extremities.  He has increased tone.     ED Results / Procedures / Treatments   Labs (all labs ordered are listed, but only abnormal results are displayed) Labs Reviewed  LACTIC ACID, PLASMA - Abnormal; Notable for the following components:      Result Value   Lactic Acid, Venous 2.9 (*)    All other components within normal limits  LACTIC ACID, PLASMA - Abnormal; Notable for the following components:   Lactic Acid, Venous 2.7 (*)    All other components within normal limits  COMPREHENSIVE METABOLIC PANEL - Abnormal; Notable for the following components:   Glucose, Bld 123 (*)    BUN 34 (*)    Creatinine, Ser 2.05 (*)    GFR, Estimated 31 (*)    All other components within normal limits  CBC WITH DIFFERENTIAL/PLATELET - Abnormal; Notable for the following components:   RBC 4.09 (*)    Hemoglobin 11.7  (*)    HCT 35.2 (*)    RDW 15.8 (*)    All other components within normal limits  BRAIN NATRIURETIC PEPTIDE - Abnormal; Notable for the following components:   B Natriuretic Peptide 596.0 (*)    All other components within normal limits  URINALYSIS, W/ REFLEX TO CULTURE (INFECTION SUSPECTED) - Abnormal; Notable for the following components:   Hgb urine dipstick MODERATE (*)    Protein, ur 30 (*)    Nitrite POSITIVE (*)    Bacteria, UA FEW (*)    All other components within normal limits  BLOOD GAS, ARTERIAL - Abnormal; Notable for the following components:   pCO2 arterial 31 (*)    pO2, Arterial 146 (*)    Bicarbonate 17.5 (*)    Acid-base deficit 7.1 (*)    All other components within normal limits  GLUCOSE, CAPILLARY - Abnormal; Notable for the following components:   Glucose-Capillary 110 (*)    All other components within normal limits  CBC - Abnormal; Notable for the following components:   RBC 3.41 (*)    Hemoglobin 9.9 (*)    HCT 28.7 (*)    RDW 15.6 (*)    Platelets 106 (*)    All other components within normal limits  BASIC METABOLIC PANEL - Abnormal; Notable for the following components:   Chloride 112 (*)    CO2 20 (*)    BUN 26 (*)    Creatinine, Ser 1.60 (*)    Calcium 8.1 (*)    GFR, Estimated 42 (*)    All other components within normal limits  MAGNESIUM - Abnormal; Notable for the following components:   Magnesium 1.4 (*)    All other components within normal limits  PHOSPHORUS - Abnormal; Notable for the following components:   Phosphorus 2.3 (*)    All other components within normal limits  BLOOD GAS, ARTERIAL - Abnormal; Notable for the following components:   pO2, Arterial 66 (*)    Acid-base deficit 3.6 (*)    All other components within normal limits  GLUCOSE, CAPILLARY - Abnormal; Notable for the following components:   Glucose-Capillary 42 (*)    All other components within normal limits  GLUCOSE, CAPILLARY - Abnormal; Notable for the  following components:   Glucose-Capillary 44 (*)    All other components within normal limits  GLUCOSE, CAPILLARY - Abnormal; Notable for the following components:   Glucose-Capillary 55 (*)    All other components within normal limits  CBG MONITORING, ED - Abnormal; Notable for the following components:  Glucose-Capillary 116 (*)    All other components within normal limits  CULTURE, BLOOD (ROUTINE X 2)  CULTURE, BLOOD (ROUTINE X 2)  RESP PANEL BY RT-PCR (RSV, FLU A&B, COVID)  RVPGX2  MRSA NEXT GEN BY PCR, NASAL  CULTURE, RESPIRATORY W GRAM STAIN  PROTIME-INR  APTT  BLOOD GAS, VENOUS  ETHANOL  AMMONIA  LIPID PANEL  SODIUM  GLUCOSE, CAPILLARY  GLUCOSE, CAPILLARY  HEMOGLOBIN A1C    EKG EKG Interpretation  Date/Time:  Thursday December 12 2022 22:24:47 EDT Ventricular Rate:  56 PR Interval:  292 QRS Duration: 110 QT Interval:  491 QTC Calculation: 474 R Axis:   56 Text Interpretation: sinusbradycardia Ventricular premature complex Abnormal R-wave progression, early transition Minimal ST depression, inferior leads Otherwise no significant change Confirmed by Deno Etienne [phone]) on 12/13/2022 12:14:06 PM  Radiology MR ANGIO NECK WO CONTRAST  Addendum Date: 12/13/2022   ADDENDUM REPORT: 12/13/2022 05:07 ADDENDUM: Brain MRI and Head/Neck MRA salient findings discussed by telephone with Dr. Roland Rack on 12/13/2022 at 05:04 . Electronically Signed   By: Genevie Ann M.D.   On: 12/13/2022 05:07   Result Date: 12/13/2022 CLINICAL DATA:  87 year old male with altered mental status. Left MCA and PCA territory infarcts on plain head CT yesterday. EXAM: MRA NECK WITHOUT CONTRAST TECHNIQUE: Angiographic images of the neck were acquired using MRA technique without intravenous contrast. Carotid stenosis measurements (when applicable) are obtained utilizing NASCET criteria, using the distal internal carotid diameter as the denominator. COMPARISON:  Brain MRI today reported separately.  FINDINGS: Noncontrast time-of-flight imaging in the neck. There is antegrade flow signal in both cervical carotid arteries. Flow signal persists at the carotid bifurcations, although the left bifurcation is not well delineated. And this may indicate occlusion of the left ICA at its origin. There is antegrade flow signal in both cervical vertebral arteries. The right appears mildly dominant. IMPRESSION: 1. Noncontrast neck MRA appearance is suspicious for Left ICA Occlusion at its origin. See also Head MRA reported separately. 2. Patent cervical right carotid and vertebral arteries. 3. Abnormal brain MRI today reported separately. Electronically Signed: By: Genevie Ann M.D. On: 12/13/2022 04:57   MR BRAIN WO CONTRAST  Addendum Date: 12/13/2022   ADDENDUM REPORT: 12/13/2022 05:07 ADDENDUM: Brain MRI and Head/Neck MRA salient findings discussed by telephone with Dr. Roland Rack on 12/13/2022 at 05:04 . Electronically Signed   By: Genevie Ann M.D.   On: 12/13/2022 05:07   Result Date: 12/13/2022 CLINICAL DATA:  87 year old male with altered mental status. Left MCA and PCA territory infarcts on plain head CT yesterday. EXAM: MRI HEAD WITHOUT CONTRAST TECHNIQUE: Multiplanar, multiecho pulse sequences of the brain and surrounding structures were obtained without intravenous contrast. COMPARISON:  Head CTs 0144 hours today and earlier. FINDINGS: Brain: Approximately 10 mm right side CSF subdural hygroma is redemonstrated. There is mild associated mass effect on the right hemisphere. There is an oval fairly circumscribed intra sellar mass with suprasellar extension which is rounded, snowman configuration encompassing 19 x 22 x 30 mm (AP by transverse by CC). There is mass effect on the left suprasellar cistern and optic chiasm. Superimposed large volume of restricted diffusion in the left hemisphere. This is confluent throughout the posterior left frontal lobe, left parietal lobe, left occipital lobe, mesial left  temporal lobe and anterior left temporal tip. Confluent involvement of the left basal ganglia. Ventral left thalamic involvement. Confluent involvement also in the left midbrain and cerebral peduncle on series 5, image 67. Contralateral right hemisphere  subcentimeter nodular restricted diffusion in the inferior right parietal lobe near the parieto-occipital sulcus on series 5, image 77. In addition to the left cerebral peduncle/midbrain restriction there is confluent restricted diffusion in the inferior right midbrain and tracking into the lateral and posterior pons toward the superior right cerebellar peduncle (series 5, image 62). The medulla spared.  The cerebellum is spared. Fairly well-developed cytotoxic edema in the areas of diffusion restriction. No convincing hemorrhagic transformation. Basilar cisterns remain patent. Mass effect on the left lateral ventricle but no significant midline shift at this time (series 11, image 14). Negative cervicomedullary junction. No convincing acute intracranial hemorrhage. Vascular: Absent left ICA flow void at the skull base, with no significant reconstitution of the left ICA terminus. Absent distal left vertebral artery flow void in the posterior fossa, and the basilar artery flow void also appears abnormal on series 10, image 8. MRA head and neck this morning are reported separately. Skull and upper cervical spine: Negative visible cervical spine. Visualized bone marrow signal is within normal limits. Sinuses/Orbits: Fluid in the pharynx. Partially visible oral enteric tube in the nasopharynx on series 10, image 1. Postoperative changes to the globes. Only trace paranasal sinus and mastoid fluid. Other: Visible internal auditory structures appear normal. IMPRESSION: 1. Large volume left hemisphere infarction with abundant cytotoxic edema: - subtotal left MCA and PCA territory involvement. - multifocal brainstem infarcts including left greater than right midbrain, right  pons. - subcentimeter inferior right parietal lobe infarct. 2. Abnormal flow voids at the skull base suggest Multiple Large Vessel Occlusions. See MRA head and neck reported separately. 3. No convincing hemorrhagic transformation. Mass effect on the left lateral ventricle, but no midline shift or loss of basilar cisterns at this time. 4. 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 Compression of the optic chiasm. 5. Superimposed 10 mm right side subdural CSF hygroma. Mild associated mass effect on the right hemisphere. Electronically Signed: By: Genevie Ann M.D. On: 12/13/2022 04:55   MR ANGIO HEAD WO CONTRAST  Result Date: 12/13/2022 CLINICAL DATA:  87 year old male with altered mental status. Left MCA and PCA territory infarcts on plain head CT yesterday. EXAM: MRA HEAD WITHOUT CONTRAST TECHNIQUE: Angiographic images of the Circle of Willis were acquired using MRA technique without intravenous contrast. COMPARISON:  Brain MRI, neck MRA today reported separately. FINDINGS: Anterior circulation: Patent right ICA siphon and terminus without stenosis. Patent right MCA and ACA origins. Patent right posterior communicating artery which appears to be a fetal type PCA origin. The right ACA is patent. No significant anterior communicating artery patency. Right MCA is patent. No right MCA or ACA branch occlusion is identified. But there is absent flow throughout the visible cervical left ICA, left ICA siphon, and terminus. Absent left MCA and ACA flow. Posterior circulation: There is antegrade flow in the distal vertebral arteries in the posterior fossa, as on the neck MRA today the right vertebral appears dominant. Vertebrobasilar junction, PICA and/or AICA origins appear patent. Proximal basilar artery is patent, but there is absent flow in the distal basilar artery with abrupt flow caught off approximately 8 mm below the basilar tip. See series 1030, image 7. Fetal type  right PCA origin appears to supply the right PCA which remains patent. Little to no flow in the left PCA, left posterior communicating artery. Anatomic variants: Possible fetal type right PCA origin. Diminutive or absent anterior communicating artery. Other: See Brain MRI reported separately. IMPRESSION: 1. 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 - Left PCA. - Left ICA, Left ACA and MCA. 2. Right ICA and Right anterior circulation remain patent, with a fetal type Right PCA origin keeping the Right PCA patent. 3. Distal vertebral arteries and Proximal Basilar remain patent. Brain MRI and Head/Neck MRA salient findings discussed by telephone with Dr. Roland Rack on 12/13/2022 at 05:04 . Electronically Signed   By: Genevie Ann M.D.   On: 12/13/2022 05:06   CT HEAD WO CONTRAST (5MM)  Result Date: 12/13/2022 CLINICAL DATA:  Follow-up examination for stroke. EXAM: CT HEAD WITHOUT CONTRAST TECHNIQUE: Contiguous axial images were obtained from the base of the skull through the vertex without intravenous contrast. RADIATION DOSE REDUCTION: This exam was performed according to the departmental dose-optimization program which includes automated exposure control, adjustment of the mA and/or kV according to patient size and/or use of iterative reconstruction technique. COMPARISON:  Prior CT from 12/07/2022. FINDINGS: Brain: Evolving cytotoxic edema involving the left cerebral hemisphere again seen. Specifically, there is involvement of the left MCA and PCA distributions, with involvement of the left basal ganglia. Overall size and distribution is similar to previous, although edema is now more pronounced and well-defined as compared to prior. No evidence for hemorrhagic transformation. No significant mass effect or midline shift. No acute intracranial hemorrhage. No other acute large vessel territory infarct. No hydrocephalus. Asymmetric prominence of the extra-axial space  overlying the right cerebral hemisphere noted, stable. No extra-axial collection. Lobulated well-circumscribed hyperdense sellar/suprasellar region again seen, measuring 2.9 x 1.9 x 2.2 cm. No other visible mass. Vascular: No abnormal hyperdense vessel. Scattered vascular calcifications noted within the carotid siphons. Skull: Scalp soft tissues and calvarium demonstrate no new finding. Sinuses/Orbits: Globes orbital soft tissues demonstrate no acute finding. Paranasal sinuses are largely clear. No mastoid effusion. Patient is intubated. Other: None. IMPRESSION: 1. Continued interval evolution of large left MCA and PCA distribution infarcts, similar in size and distribution from prior. No evidence for hemorrhagic transformation or significant regional mass effect at this time. 2. No other new acute intracranial abnormality. 3. 2.9 x 1.9 x 2.2 cm hyperdense sellar/suprasellar region mass, stable. 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 Large aneurysm would be the primary differential consideration. Electronically Signed   By: Jeannine Boga M.D.   On: 12/13/2022 02:40   DG Chest Port 1 View  Result Date: 11/22/2022 CLINICAL DATA:  post intubation EXAM: PORTABLE CHEST 1 VIEW COMPARISON:  Same day chest x-ray and CT chest. FINDINGS: Bibasilar opacities. Endotracheal tube tip is approximately 5 cm above the carina. Gastric tube courses below the diaphragm with the tip in the stomach and the side port likely below the GE junction. No visible pleural effusions or pneumothorax. Cardiomediastinal silhouette is within normal limits. IMPRESSION: 1. Endotracheal tube tip is approximately 5 cm above the carina. 2. Bibasilar opacities, further characterized on same day CT chest. Electronically Signed   By: Margaretha Sheffield M.D.   On: 11/23/2022 20:45   CT CHEST ABDOMEN PELVIS WO CONTRAST  Result Date: 11/23/2022 CLINICAL DATA:  Nonresponsive EXAM: CT CHEST, ABDOMEN AND PELVIS WITHOUT CONTRAST  TECHNIQUE: Multidetector CT imaging of the chest, abdomen and pelvis was performed following the standard protocol without IV contrast. RADIATION DOSE REDUCTION: This exam was performed according to the departmental dose-optimization program which includes automated exposure control, adjustment of the mA and/or kV according to patient size and/or use of iterative reconstruction technique. COMPARISON:  Chest CT 08/07/2022 FINDINGS: CT CHEST FINDINGS Cardiovascular: Limited  evaluation without intravenous contrast. Mild aortic atherosclerosis. Ascending aortic diameter of 4 cm. No pericardial effusion Mediastinum/Nodes: Midline trachea. No thyroid mass. No suspicious lymph nodes. Esophagus within normal limits. Lungs/Pleura: Multifocal consolidation and ground-glass density in the right upper lobe and left greater than right lower lobes. No pleural effusion or pneumothorax. Musculoskeletal: Sternum is intact.  No acute osseous abnormality CT ABDOMEN PELVIS FINDINGS Hepatobiliary: No focal liver abnormality is seen. No gallstones, gallbladder wall thickening, or biliary dilatation. Pancreas: Unremarkable. No pancreatic ductal dilatation or surrounding inflammatory changes. Spleen: Normal in size without focal abnormality. Adrenals/Urinary Tract: Adrenal glands are unremarkable. Kidneys are normal, without renal calculi, focal lesion, or hydronephrosis. Bladder is unremarkable. Stomach/Bowel: Stomach is within normal limits. Appendix appears normal. No evidence of bowel wall thickening, distention, or inflammatory changes. Vascular/Lymphatic: Moderate aortic atherosclerosis. No aneurysm. No suspicious lymph nodes. Reproductive: Status post prostatectomy. Other: Negative for pelvic effusion or free air Musculoskeletal: No acute or suspicious osseous abnormality IMPRESSION: 1. Multifocal consolidation and ground-glass density within the right upper lobe and left greater than right lower lobes, consistent with multifocal  pneumonia. 2. No CT evidence for acute intra-abdominal or pelvic abnormality. 3. Aortic atherosclerosis. Aortic Atherosclerosis (ICD10-I70.0). Electronically Signed   By: Donavan Foil M.D.   On: 12/19/2022 19:25   CT Head Wo Contrast  Result Date: 12/09/2022 CLINICAL DATA:  Altered mental status. EXAM: CT HEAD WITHOUT CONTRAST TECHNIQUE: Contiguous axial images were obtained from the base of the skull through the vertex without intravenous contrast. RADIATION DOSE REDUCTION: This exam was performed according to the departmental dose-optimization program which includes automated exposure control, adjustment of the mA and/or kV according to patient size and/or use of iterative reconstruction technique. COMPARISON:  Head CT dated 08/07/2022. FINDINGS: Brain: Large areas of low attenuation involving the left temporal lobe, left parietal and occipital lobes consistent with infarcts, likely acute or subacute. There is also involvement of the left basal ganglia and thalamus. There is moderate age-related atrophy and chronic microvascular ischemic changes. No acute intracranial hemorrhage. A 1.6 x 1.6 cm rounded high attenuating lesion arising from the sella has increased since the prior CT this lesion measures approximately 3.5 cm in craniocaudal length. There is dilatation of the extra-axial space over the right frontal lobe as seen on the prior CT which may represent an old hygroma or an arachnoid cyst. Vascular: No hyperdense vessel or unexpected calcification. Skull: Normal. Negative for fracture or focal lesion. Sinuses/Orbits: No acute finding. Other: None IMPRESSION: 1. No acute intracranial hemorrhage. 2. Large left MCA and PCA territory infarct stone likely acute or subacute. Correlation and further evaluation with MRI, if clinically indicated, recommended. 3. Slight interval increase in the size of the high attenuating lesion arising from the sella. These results were called by telephone at the time of  interpretation on 12/22/2022 at 7:17 pm to provider [facility] , who verbally acknowledged these results. Electronically Signed   By: Anner Crete M.D.   On: 11/30/2022 19:19   DG Chest Port 1 View  Result Date: 12/17/2022 CLINICAL DATA:  Sepsis, unresponsive EXAM: PORTABLE CHEST 1 VIEW COMPARISON:  08/07/2022 FINDINGS: Single frontal view of the chest demonstrates an unremarkable cardiac silhouette. There is chronic elevation of the left hemidiaphragm. Patchy consolidation at the left lung base could reflect airspace disease or hypoventilatory change. No effusion or pneumothorax. No acute bony abnormalities. Increased density overlying the manubrium and left supraclavicular regions may be external to the patient. IMPRESSION: 1. Patchy left basilar consolidation which may reflect  atelectasis or airspace disease. 2. Increased density overlying the manubrium and left supraclavicular regions, felt to represent overlying shadow from external object. PA and lateral views may be useful for follow-up. Electronically Signed   By: Randa Ngo M.D.   On: 12/05/2022 18:05    Procedures .Critical Care  Performed by: Hayden Rasmussen, MD Authorized by: Hayden Rasmussen, MD   Critical care provider statement:    Critical care time (minutes):  45   Critical care time was exclusive of:  Separately billable procedures and treating other patients   Critical care was necessary to treat or prevent imminent or life-threatening deterioration of the following conditions:  CNS failure or compromise and respiratory failure   Critical care was time spent personally by me on the following activities:  Development of treatment plan with patient or surrogate, discussions with consultants, evaluation of patient's response to treatment, examination of patient, obtaining history from patient or surrogate, ordering and performing treatments and interventions, ordering and review of laboratory studies, ordering and review  of radiographic studies, pulse oximetry, re-evaluation of patient's condition and review of old charts   I assumed direction of critical care for this patient from another provider in my specialty: no       Medications Ordered in ED Medications  Chlorhexidine Gluconate Cloth 2 % PADS 6 each (6 each Topical Given 11/29/2022 2342)  Oral care mouth rinse (15 mLs Mouth Rinse Given 12/13/22 1410)  Oral care mouth rinse (has no administration in time range)  aspirin tablet 325 mg (has no administration in time range)  acetaminophen (TYLENOL) tablet 650 mg (has no administration in time range)    Or  acetaminophen (TYLENOL) suppository 650 mg (has no administration in time range)  glycopyrrolate (ROBINUL) tablet 1 mg (has no administration in time range)    Or  glycopyrrolate (ROBINUL) injection 0.2 mg (has no administration in time range)    Or  glycopyrrolate (ROBINUL) injection 0.2 mg (has no administration in time range)  polyvinyl alcohol (LIQUIFILM TEARS) 1.4 % ophthalmic solution 1 drop (has no administration in time range)  morphine 100mg  in NS 161mL (1mg /mL) infusion - premix (2 mg/hr Intravenous Infusion Verify 12/13/22 1430)  morphine bolus via infusion 2 mg (has no administration in time range)  LORazepam (ATIVAN) injection 2 mg (has no administration in time range)  lactated ringers bolus 1,000 mL (0 mLs Intravenous Stopped 12/03/2022 2039)  lactated ringers bolus 1,000 mL (0 mLs Intravenous Stopped 12/15/2022 2049)  etomidate (AMIDATE) injection 20 mg (20 mg Intravenous Given 12/22/2022 2021)  succinylcholine (ANECTINE) syringe 100 mg (100 mg Intravenous Given 12/18/2022 2021)  dextrose 50 % solution (  Duplicate [id number])  dextrose 50 % solution 12.5 g (12.5 g Intravenous Given 12/13/22 1151)    ED Course/ Medical Decision Making/ A&P Clinical Course as of 12/13/22 1510  Thu Dec 12, 2022  1728 I suctioned a large amount of secretions from his throat and he had no gag. [MB]  78  Patient's daughter is here now.  She said he has been doing well recently.  Has known cardiac condition and was going to get a pacemaker but was doing okay from that standpoint.  She said normally he is awake and alert.  They do not have any formal code decisions made in advance. [MB]  [id number] Chest x-ray interpreted by me as no definite infiltrate.  Awaiting radiology reading. [MB]  1954 Patient has extensive stroke.  Also has multifocal pneumonia on x-ray.  Remains not  protecting his airway with secretions in the back of his throat.  Had extensive discussion with his daughter and 2 sons.  They would like to proceed as a full code and are agreeable to intubation. [MB]  2028 Intubated patient for airway protection. [MB]  2114 discussed with Dr. Patsey Berthold critical care who is excepted patient in transfer to [facility] ICU.  Reviewed case with Dr. Leonel Ramsay neurology who is recommending ICU admission at [facility] as may need 3% saline for possible cerebral edema. [MB]    Clinical Course User Index [MB] Hayden Rasmussen, MD                             Medical Decision Making Amount and/or Complexity of Data Reviewed Labs: ordered. Radiology: ordered. ECG/medicine tests: ordered.  Risk Prescription drug management. Decision regarding hospitalization.   This patient complains of acute respiratory Ress altered mental status airway issues; this involves an extensive number of treatment Options and is a complaint that carries with it a high risk of complications and morbidity. The differential includes stroke, bleed, tumor, seizures, hypoglycemia, aspiration  I ordered, reviewed and interpreted labs, which included CBC with normal white count low hemoglobin, chemistries with chronic CKD, VBG without acidosis, lactate elevated, blood culture sent, COVID and flu negative I ordered medication IV fluids IV antibiotics, medications for intubation and reviewed PMP when indicated. I ordered imaging studies which  included chest x-ray, head CT, CT chest abdomen and pelvis and I independently    visualized and interpreted imaging which showed large territory stroke, multifocal pneumonia Additional history obtained from EMS and patient's family members Previous records obtained and reviewed in epic no recent admissions I consulted neurology Dr. Leonel Ramsay and critical care Dr. Patsey Berthold and discussed lab and imaging findings and discussed disposition.  Cardiac monitoring reviewed, normal sinus rhythm/sinus bradycardia Social determinants considered, ongoing tobacco use Critical Interventions: Initiation of complex workup for altered mental status and respiratory distress, requiring nasopharyngeal suctioning and ultimately intubation for airway protection.  Appreciate assistance of PA Roemhildt and assisting with intubation.  I was present at the bedside for entire procedure.  Also had extensive discussions with family regarding CODE STATUS and discussion whether proceed with comfort care versus intubation and transfer for further management.  After the interventions stated above, I reevaluated the patient and found patient to be critically ill although hemodynamically stable and airway protected with intubation Admission and further testing considered, patient would benefit from transfer to [facility] for further evaluation by neurology and critical care.  Patient condition is critical.         Final Clinical Impression(s) / ED Diagnoses Final diagnoses:  Acute CVA (cerebrovascular accident) [facility])  Multifocal pneumonia    Rx / DC Orders ED Discharge Orders     None         Hayden Rasmussen, MD 12/13/22 1517

## 2022-12-12 NOTE — ED Provider Notes (Signed)
Procedures  Procedure Name: Intubation Date/Time: 11/22/2022 8:28 PM  Performed by: Kateri Plummer, PA-CPre-anesthesia Checklist: Patient identified, Patient being monitored, Emergency Drugs available, Timeout performed and Suction available Oxygen Delivery Method: Non-rebreather mask Preoxygenation: Pre-oxygenation with 100% oxygen Induction Type: Rapid sequence Ventilation: Mask ventilation without difficulty Laryngoscope Size: Glidescope Grade View: Grade I Tube type: Subglottic suction tube Tube size: 8.0 mm Number of attempts: 1 Airway Equipment and Method: Stylet and Video-laryngoscopy Placement Confirmation: ETT inserted through vocal cords under direct vision, CO2 detector and Breath sounds checked- equal and bilateral Secured at: 24 cm Tube secured with: ETT holder Dental Injury: Teeth and Oropharynx as per pre-operative assessment           Sakira Dahmer T, PA-C 12/11/2022 2033    Hayden Rasmussen, MD 12/13/22 [phone]

## 2022-12-13 ENCOUNTER — Inpatient Hospital Stay (HOSPITAL_COMMUNITY): Payer: Medicare Other

## 2022-12-13 DIAGNOSIS — J9601 Acute respiratory failure with hypoxia: Secondary | ICD-10-CM | POA: Diagnosis present

## 2022-12-13 DIAGNOSIS — I63412 Cerebral infarction due to embolism of left middle cerebral artery: Secondary | ICD-10-CM | POA: Diagnosis not present

## 2022-12-13 DIAGNOSIS — I639 Cerebral infarction, unspecified: Secondary | ICD-10-CM | POA: Diagnosis present

## 2022-12-13 DIAGNOSIS — J189 Pneumonia, unspecified organism: Secondary | ICD-10-CM | POA: Diagnosis present

## 2022-12-13 DIAGNOSIS — Z7189 Other specified counseling: Secondary | ICD-10-CM

## 2022-12-13 DIAGNOSIS — N179 Acute kidney failure, unspecified: Secondary | ICD-10-CM | POA: Diagnosis present

## 2022-12-13 DIAGNOSIS — I6389 Other cerebral infarction: Secondary | ICD-10-CM | POA: Diagnosis not present

## 2022-12-13 DIAGNOSIS — I6322 Cerebral infarction due to unspecified occlusion or stenosis of basilar arteries: Secondary | ICD-10-CM | POA: Diagnosis not present

## 2022-12-13 LAB — PHOSPHORUS: Phosphorus: 2.3 mg/dL — ABNORMAL LOW (ref 2.5–4.6)

## 2022-12-13 LAB — MAGNESIUM: Magnesium: 1.4 mg/dL — ABNORMAL LOW (ref 1.7–2.4)

## 2022-12-13 LAB — ECHOCARDIOGRAM COMPLETE
AR max vel: 3.57 cm2
AV Area VTI: 3.03 cm2
AV Area mean vel: 3.19 cm2
AV Mean grad: 1 mmHg
AV Peak grad: 2 mmHg
Ao pk vel: 0.71 m/s
Area-P 1/2: 2.6 cm2
Height: 69 in
S' Lateral: 4.4 cm
Single Plane A4C EF: 42.7 %
Weight: 2179.91 oz

## 2022-12-13 LAB — LIPID PANEL
Cholesterol: 110 mg/dL (ref 0–200)
HDL: 47 mg/dL (ref >40)
LDL Cholesterol: 49 mg/dL (ref 0–99)
Total CHOL/HDL Ratio: 2.3 RATIO
Triglycerides: 69 mg/dL (ref <150)
VLDL: 14 mg/dL (ref 0–40)

## 2022-12-13 LAB — BASIC METABOLIC PANEL
Anion gap: 8 (ref 5–15)
BUN: 26 mg/dL — ABNORMAL HIGH (ref 8–23)
CO2: 20 mmol/L — ABNORMAL LOW (ref 22–32)
Calcium: 8.1 mg/dL — ABNORMAL LOW (ref 8.9–10.3)
Chloride: 112 mmol/L — ABNORMAL HIGH (ref 98–111)
Creatinine, Ser: 1.6 mg/dL — ABNORMAL HIGH (ref 0.61–1.24)
GFR, Estimated: 42 mL/min — ABNORMAL LOW (ref >60)
Glucose, Bld: 71 mg/dL (ref 70–99)
Potassium: 3.9 mmol/L (ref 3.5–5.1)
Sodium: 140 mmol/L (ref 135–145)

## 2022-12-13 LAB — CBC
HCT: 28.7 % — ABNORMAL LOW (ref 39.0–52.0)
Hemoglobin: 9.9 g/dL — ABNORMAL LOW (ref 13.0–17.0)
MCH: 29 pg (ref 26.0–34.0)
MCHC: 34.5 g/dL (ref 30.0–36.0)
MCV: 84.2 fL (ref 80.0–100.0)
Platelets: 106 10*3/uL — ABNORMAL LOW (ref 150–400)
RBC: 3.41 MIL/uL — ABNORMAL LOW (ref 4.22–5.81)
RDW: 15.6 % — ABNORMAL HIGH (ref 11.5–15.5)
WBC: 4.3 10*3/uL (ref 4.0–10.5)
nRBC: 0 % (ref 0.0–0.2)

## 2022-12-13 LAB — MRSA NEXT GEN BY PCR, NASAL: MRSA by PCR Next Gen: NOT DETECTED

## 2022-12-13 LAB — GLUCOSE, CAPILLARY
Glucose-Capillary: 74 mg/dL (ref 70–99)
Glucose-Capillary: 70 mg/dL (ref 70–99)
Glucose-Capillary: 42 mg/dL — CL (ref 70–99)
Glucose-Capillary: 44 mg/dL — CL (ref 70–99)
Glucose-Capillary: 55 mg/dL — ABNORMAL LOW (ref 70–99)

## 2022-12-13 LAB — BLOOD GAS, ARTERIAL
Acid-base deficit: 3.6 mmol/L — ABNORMAL HIGH (ref 0.0–2.0)
Bicarbonate: 20.6 mmol/L (ref 20.0–28.0)
Drawn by: 683471
O2 Saturation: 97.4 %
Patient temperature: 37.2
pCO2 arterial: 34 mmHg (ref 32–48)
pH, Arterial: 7.39 (ref 7.35–7.45)
pO2, Arterial: 66 mmHg — ABNORMAL LOW (ref 83–108)

## 2022-12-13 LAB — SODIUM: Sodium: 136 mmol/L (ref 135–145)

## 2022-12-13 MED ORDER — GLYCOPYRROLATE 0.2 MG/ML IJ SOLN
0.2000 mg | INTRAMUSCULAR | Status: DC | PRN
  Administered 2022-12-13: 0.2 mg via INTRAVENOUS
  Filled 2022-12-13: qty 1

## 2022-12-13 MED ORDER — ACETAMINOPHEN 650 MG RE SUPP: 650.0000 mg | Freq: Four times a day (QID) | RECTAL | Status: DC | PRN

## 2022-12-13 MED ORDER — SIMVASTATIN 20 MG PO TABS: 20.0000 mg | ORAL_TABLET | Freq: Every day | ORAL | Status: DC

## 2022-12-13 MED ORDER — FAMOTIDINE 20 MG PO TABS: 20.0000 mg | ORAL_TABLET | Freq: Every day | ORAL | Status: DC

## 2022-12-13 MED ORDER — INSULIN ASPART 100 UNIT/ML IJ SOLN: 0.0000 [IU] | INTRAMUSCULAR | Status: DC

## 2022-12-13 MED ORDER — DEXTROSE 50 % IV SOLN
INTRAVENOUS | Status: AC
  Filled 2022-12-13: qty 50

## 2022-12-13 MED ORDER — DEXTROSE 50 % IV SOLN
12.5000 g | INTRAVENOUS | Status: AC
  Administered 2022-12-13: 12.5 g via INTRAVENOUS

## 2022-12-13 MED ORDER — MORPHINE BOLUS VIA INFUSION: 2.0000 mg | INTRAVENOUS | Status: DC | PRN

## 2022-12-13 MED ORDER — LORAZEPAM 2 MG/ML IJ SOLN: 2.0000 mg | INTRAMUSCULAR | Status: DC | PRN

## 2022-12-13 MED ORDER — ASPIRIN 325 MG PO TABS: 325.0000 mg | ORAL_TABLET | Freq: Every day | ORAL | Status: DC

## 2022-12-13 MED ORDER — ENOXAPARIN SODIUM 30 MG/0.3ML IJ SOSY
30.0000 mg | PREFILLED_SYRINGE | INTRAMUSCULAR | Status: DC
  Administered 2022-12-13: 30 mg via SUBCUTANEOUS
  Filled 2022-12-13: qty 0.3

## 2022-12-13 MED ORDER — ASPIRIN 300 MG RE SUPP
300.0000 mg | Freq: Every day | RECTAL | Status: DC
  Administered 2022-12-13: 300 mg via RECTAL
  Filled 2022-12-13: qty 1

## 2022-12-13 MED ORDER — MORPHINE 100MG IN NS 100ML (1MG/ML) PREMIX INFUSION
2.0000 mg/h | INTRAVENOUS | Status: DC
  Administered 2022-12-13: 2 mg/h via INTRAVENOUS
  Administered 2022-12-14: 10 mg/h via INTRAVENOUS
  Filled 2022-12-13 (×2): qty 100

## 2022-12-13 MED ORDER — GLYCOPYRROLATE 1 MG PO TABS: 1.0000 mg | ORAL_TABLET | ORAL | Status: DC | PRN

## 2022-12-13 MED ORDER — ORAL CARE MOUTH RINSE: 15.0000 mL | OROMUCOSAL | Status: DC | PRN

## 2022-12-13 MED ORDER — ORAL CARE MOUTH RINSE
15.0000 mL | OROMUCOSAL | Status: DC
  Administered 2022-12-13 (×8): 15 mL via OROMUCOSAL

## 2022-12-13 MED ORDER — POLYVINYL ALCOHOL 1.4 % OP SOLN: 1.0000 [drp] | Freq: Four times a day (QID) | OPHTHALMIC | Status: DC | PRN

## 2022-12-13 MED ORDER — ACETAMINOPHEN 325 MG PO TABS: 650.0000 mg | ORAL_TABLET | Freq: Four times a day (QID) | ORAL | Status: DC | PRN

## 2022-12-13 MED ORDER — GLYCOPYRROLATE 0.2 MG/ML IJ SOLN: 0.2000 mg | INTRAMUSCULAR | Status: DC | PRN

## 2022-12-13 MED ORDER — POLYETHYLENE GLYCOL 3350 17 G PO PACK: 17.0000 g | PACK | Freq: Every day | ORAL | Status: DC | PRN

## 2022-12-13 MED ORDER — DOCUSATE SODIUM 50 MG/5ML PO LIQD: 100.0000 mg | Freq: Two times a day (BID) | ORAL | Status: DC | PRN

## 2022-12-13 MED ORDER — MIDAZOLAM HCL 2 MG/2ML IJ SOLN
2.0000 mg | Freq: Once | INTRAMUSCULAR | Status: DC | PRN
  Filled 2022-12-13: qty 2

## 2022-12-13 MED ORDER — SODIUM CHLORIDE 3 % IV SOLN
INTRAVENOUS | Status: DC
  Filled 2022-12-13 (×4): qty 500

## 2022-12-13 MED ORDER — FAMOTIDINE 20 MG PO TABS
20.0000 mg | ORAL_TABLET | Freq: Two times a day (BID) | ORAL | Status: DC
  Administered 2022-12-13: 20 mg
  Filled 2022-12-13: qty 1

## 2022-12-13 MED ORDER — DOCUSATE SODIUM 100 MG PO CAPS: 100.0000 mg | ORAL_CAPSULE | Freq: Two times a day (BID) | ORAL | Status: DC | PRN

## 2022-12-13 NOTE — Progress Notes (Addendum)
eLink Physician-Brief Progress Note Patient Name: Nathan [name] DOB: 1936-02-02 MRN: [id number]   Date of Service  12/13/2022  HPI/Events of Note  87 year old male with a history of prostate cancer, gout, metabolic syndrome who initially presents to [facility] with new stroke and intubated use shown to have a large left MCA and PCA infarct.  He had an acute kidney injury and mild lactic acidosis.  Neurology has been consulted.  MRI brain has been pending.  Blood pressure has been in the [id number] systolic.  Saturating well on PRVC with PEEP of 5 and volume of 570.  Currently RASS of -4.  Laboratory studies reviewed with no significant metabolic abnormalities, CBC with chronic normocytic anemia.  CT chest with multifocal consolidations and GGO  eICU Interventions  Neurology critical care bedside.  Limited neurological exam, synchronous later, not requiring significant sedation at this time.  MRI has been ordered to better evaluate 1.6 x 1.6 cm rounded attenuating lesion  Family discussion per primary team.  Empiric antibiotics for community-acquired coverage.  Orders reviewed, no other acute interventions indicated at this time.   0300 -OG tube okay to use extends below the diaphragm.  Additional Versed one-time made available for MRI.  Intervention Category Evaluation Type: New Patient Evaluation  Vonnie Ligman 12/13/2022, 12:21 AM

## 2022-12-13 NOTE — Procedures (Signed)
Extubation Procedure Note  Patient Details:   Name: Nathan [name] DOB: 10/02/1935 MRN: [id number]   Airway Documentation:  Airway 7.5 mm (Active)  Secured at (cm) 26 cm 12/13/22 1611  Measured From Lips 12/13/22 1611  Secured Location Right 12/13/22 1611  Secured By [employer] 12/13/22 1611  Tube Holder Repositioned Yes 12/13/22 1611  Prone position No 12/13/22 1611  Cuff Pressure (cm H2O) Clear OR 27-39 CmH2O 12/13/22 0751  Site Condition Dry 12/13/22 1611   Vent end date: (not recorded) Vent end time: (not recorded)   Evaluation  O2 sats: currently acceptable Complications: No apparent complications Patient did tolerate procedure well. Bilateral Breath Sounds: Diminished   No  One way extubations to comfort care  [name] 12/13/2022, 6:06 PM

## 2022-12-13 NOTE — Consult Note (Addendum)
Neurology Consultation Reason for Consult: Stroke Referring Physician: Carin Hock  CC: Stroke  History is obtained from: Patient's son  HPI: Nathan [name] is a 87 y.o. male who was last seen well at 42 AM by his son as the patient was going on to get his lawn more.  Subsequently he was found with decreased responsiveness by his family and brought into the emergency department at [facility].  Their head CT revealed a very large PCA territory with involvement of the posterior MCA territory as well.  There was concern for his ability to protect his own airway and he was hypoxemic and therefore he was intubated in the emergency department.  In the ER, a chest CT also revealed multifocal pneumonia.   LKW: 9 AM tnk given?: no, large infarct already present on CT Premorbid modified rankin scale: 1  Past Medical History:  Diagnosis Date   Anemia, normocytic normochromic    H&H of 12.3/35.5 in 01/2012   Auditory impairment    OS   Bradycardia    Evaluated by Dr. Cletus Gash in 2009   Carcinoma of prostate [facility]) 2004   Gout    Last symptomatic episode in 2006   Hyperlipidemia    Lipid profile in 01/2012:203, 475, 37, X   Hypertension    Systolic murmur    Consistent with mitral regurgitation   Tobacco abuse    0.5 pack per day; 30 pack years     Family History  Problem Relation Age of Onset   Heart disease Mother    Arrhythmia Mother        brother(brady)   Coronary artery disease Neg Hx    Colon cancer Neg Hx      Social History:  reports that he has been smoking cigarettes. He started smoking about 65 years ago. He has a 27.50 pack-year smoking history. He has never used smokeless tobacco. He reports that he does not currently use alcohol after a past usage of about 4.0 standard drinks of alcohol per week. He reports that he does not use drugs.   Exam: Current vital signs: BP (!) 144/64 (BP Location: Left Arm)   Pulse (!) 51   Temp 97.7 F (36.5 C) (Oral)   Resp  20   Ht 5\' 9"  (1.753 m)   Wt 61.8 kg   SpO2 97%   BMI 20.12 kg/m  Vital signs in last 24 hours: Temp:  [97.7 F (36.5 C)-99.4 F (37.4 C)] 97.7 F (36.5 C) (03/21 2339) Pulse Rate:  [51-103] 51 (03/22 0000) Resp:  [20-31] 20 (03/22 0000) BP: (127-159)/(64-94) 144/64 (03/22 0000) SpO2:  [92 %-100 %] 97 % (03/22 0000) FiO2 (%):  [40 %-100 %] 40 % (03/22 0000) Weight:  [61.8 kg-64.4 kg] 61.8 kg (03/21 2339)   Physical Exam  Appears well-developed and well-nourished.   Neuro: Mental Status: Patient is obtunded, he does not open eyes or follow commands. Cranial Nerves: II: He does not blink to threat. Pupils are equal, round, and reactive to light.   III,IV, VI: eyes are slightly disconjugate, but both move with doll's maneuver V: VII: Corneal reflex is absent on the right, present on the left X: Cough is intact Motor: To noxious stimulation he has flexion in bilateral upper extremities and extension in bilateral lower extremities. Sensory: As above  Cerebellar: Does not perform     I have reviewed labs in epic and the results pertinent to this consultation are: Creatinine 2.5  I have reviewed the images  obtained: CT head-large edematous PCA infarct with slightly less edematous MCA infarct  The following diagnoses were present on arrival: Cerebral edema Probable brain herniation Coma Brain compression Cerebral infarct due to occlusion or stenosis of the left PCA Cerebral infarct due to occlusion or stenosis of the left MCA  Impression: 87 year old male with a large PCA/posterior MCA infarct on initial head CT.  Given the time between his last known well and the head CT, I am concerned that he is going to develop severe malignant edema.  Given that the entirety of the lower per the hemispheres involved, he is at high risk for herniation, and indeed I have concerns that this process may have started based on his above exam.  I discussed options with the daughter, and  after discussing surgical options and the fact that at 73, I suspect that his outcome if he progresses to the point of needing a surgical decompression would be very unlikely to be good, she agrees that if things progressed to the need for surgery, we would NOT proceed with that as an option.  I also discussed CODE STATUS, however the daughter was not comfortable making a decision without discussing it further with her brothers.  I do think an hypertonic saline is an option, and I have started this.  We will repeat stat imaging to assess if his current exam represents progression.  Recommendations: 1) MRI brain, MRA head and neck 2) if there is to be delay in MRI, would proceed with stat head CTasa suppositorysuppository 3) permissive htn to 220/120 4) echo, telemetry 5) neurology will follow.    This patient is critically ill and at significant risk of neurological worsening, death and care requires constant monitoring of vital signs, hemodynamics,respiratory and cardiac monitoring, neurological assessment, discussion with family, other specialists and medical decision making of high complexity. I spent 65 minutes of neurocritical care time  in the care of  this patient. This was time spent independent of any time provided by nurse practitioner or PA.  Roland Rack, MD [facility] [phone]  If 7pm- 7am, please page neurology on call as listed in [place]. 12/13/2022  12:59 AM

## 2022-12-13 NOTE — IPAL (Signed)
  Interdisciplinary Goals of Care Family Meeting   Date carried out: 12/13/2022  Location of the meeting: Unit  Member's involved: Physician and Family Member or next of kin  Durable Power of Attorney or Loss adjuster, chartered: All 4 siblings assembled  Discussion: We discussed goals of care for Greg Cutter .  Neurologic devastation with multifocal stroke, left large MCA territory as well as brainstem stroke.  Neurologically devastated.  MCA stroke with mean significant motor and likely language deficits.  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  His neurologic exam is extremely poor, brainstem intact but nothing else.  Does not respond to pain sternal rub etc.  Some evidence of flexing or posturing and mild clonus with stimulation.  Recommended comfort measures only.  Most siblings agree.  Ongoing discussions amongst family.  Advised for people to come and visit.  Advised that he could progress to brain death.  We are treating him aggressively with hypertonic saline etc. but likely futile effort.  Recommended DNR and family agrees.  Code status:   Code Status: DNR   Disposition: Continue current acute care  Time spent for the meeting: 15 minutes    Lanier Clam, MD  12/13/2022, 10:46 AM

## 2022-12-13 NOTE — Progress Notes (Signed)
NAME:  Nathan [name], MRN:  [id number], DOB:  03-20-1936, LOS: 1 ADMISSION DATE:  12/18/2022, CONSULTATION DATE:  11/23/2022 REFERRING MD:  EDP, CHIEF COMPLAINT:  CVA   History of Present Illness:  87 year old male with past medical history as below, which is significant for hypertension, chronic sinus bradycardia, CKD 3, prostate cancer, gout, and hyperlipidemia. He was last known well at approximately 10 AM on 3/21, however, later that day he was found to be unresponsive while sitting in his car. He was transferred to [facility] the ED by EMS. He was unable to provide any history in the emergency department was found to be hypoxemic. He was intubated. 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 Laboratory evaluation significant for BUN 34, creatinine 2.05, BNP 596, lactic acid 2.9, hemoglobin 11.7. Neurology was remotely consulted and recommended transfer to [facility] for ICU admission. PCCM asked to admit.   Pertinent  Medical History  Normocytic anemia Auditory impairment Chronic asymptomatic bradycardia Prostate cancer Gout Hyperlipidemia Hypertension Tobacco use  Significant Hospital Events: Including procedures, antibiotic start and stop dates in addition to other pertinent events   3/21 presented to [facility], intubated, and transferred to [facility] 3/22 made DNR  Interim History / Subjective:  Overnight transferred to [facility] after being intubated to protect airway. Family discussion this AM, patient made DNR.   Objective   Blood pressure (!) 154/63, pulse (!) 49, temperature 97.6 F (36.4 C), temperature source Axillary, resp. rate 17, height 5\' 9"  (1.753 m), weight 61.8 kg, SpO2 98 %.    Vent Mode: PRVC FiO2 (%):  [40 %-100 %] 40 % Set Rate:  [18 bmp-28 bmp] 18 bmp Vt Set:  [570 mL] 570 mL PEEP:  [5 cmH20] 5 cmH20 Plateau Pressure:  [11 cmH20-29 cmH20] 19 cmH20   Intake/Output Summary (Last 24 hours) at 12/13/2022 0930 Last data filed at  12/13/2022 0800 Gross per 24 hour  Intake 4701.95 ml  Output 100 ml  Net 4601.95 ml   Filed Weights   12/08/2022 1737 12/03/2022 2339 12/13/22 0500  Weight: 64.4 kg 61.8 kg 61.8 kg    Examination: General: Resting in bed, intubated and sedated HENT: [place]/AT. Pupils bilaterally sluggish but reactive.  Lungs: Normal work of breathing with ventilator. Clear to auscultation bilaterally. Cardiovascular: Bradycardic, regular rhythm. No murmurs appreciated. Warm extremities. Abdomen: Soft, non-distended. Normoactive bowel sounds. Extremities: Warm, dry. No rashes or lesions. Neuro: Sedated, not arousable. Not responding to pain, bilateral upper extremity posturing, mild clonus in bilateral upper extremities.   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 Pupils are reactive. Given nature of stroke, overall prognosis is poor. With involvement of brainstem low likelihood of patient having meaningful recovery. Prior to hospitalization patient was independent and living by himself. Discussed with family this morning, they are in agreement with DNR and are discussing potential comfort measures. They have requested palliative consult to help further discussions.  - Wean sedation for better neurologic exam  - CODE status changed to DNR - Appreciate palliative and neurology's recommendation - Discuss d/c 3% saline with neurology - Permissive hypertension, hold home anti-hypertensives - Daily aspirin, statin - Echocardiogram pending  #Acute hypoxic respiratory failure likely 2/2 aspiration pneumonia No acute changes, cultures negative thus far. Will continue with antibiotics.  - Continue ceftriaxone, azithromycin - Follow-up blood and  sputum cultures - VAP bundle, PAD protocol - Daily SBT  #Chronic kidney disease  stage IIIb #Chronic metabolic acidosis #Anemia of chronic disease AKI resolved, back to baseline with fluids. Holding home bicarbonate for now. - Daily RFP - Strict I/O  #Chronic asymptomatic bradycardia No acute change from previous, no signs of worsening at this time.   #Gout Hold home allopurinol during acute illness  Best Practice (right click and "Reselect all SmartList Selections" daily)   Diet/type: NPO w/ meds via tube DVT prophylaxis: SCD GI prophylaxis: H2B Lines: N/A Foley:  N/A Code Status:  DNR Last date of multidisciplinary goals of care discussion [discussed with daughter, three sons, and granddaughter with 3/22]  Labs   CBC: Recent Labs  Lab 12/22/2022 1741 12/13/22 0753  WBC 4.9 4.3  NEUTROABS 3.7  --   HGB 11.7* 9.9*  HCT 35.2* 28.7*  MCV 86.1 84.2  PLT 176 106*    Basic Metabolic Panel: Recent Labs  Lab 11/23/2022 1741 12/13/22 0104 12/13/22 0753  NA 138 136 140  K 4.2  --  3.9  CL 105  --  112*  CO2 23  --  20*  GLUCOSE 123*  --  71  BUN 34*  --  26*  CREATININE 2.05*  --  1.60*  CALCIUM 9.0  --  8.1*  MG  --   --  1.4*  PHOS  --   --  2.3*   GFR: Estimated Creatinine Clearance: 30 mL/min (A) (by C-G formula based on SCr of 1.6 mg/dL (H)). Recent Labs  Lab 12/22/2022 1741 12/04/2022 1859 12/13/22 0753  WBC 4.9  --  4.3  LATICACIDVEN 2.9* 2.7*  --     Liver Function Tests: Recent Labs  Lab 12/13/2022 1741  AST 24  ALT 11  ALKPHOS 62  BILITOT 0.8  PROT 7.6  ALBUMIN 4.3   No results for input(s): "LIPASE", "AMYLASE" in the last 168 hours. Recent Labs  Lab 12/16/2022 1741  AMMONIA 14    ABG    Component Value Date/Time   PHART 7.39 12/13/2022 0520   PCO2ART 34 12/13/2022 0520   PO2ART 66 (L) 12/13/2022 0520   HCO3 20.6 12/13/2022 0520   ACIDBASEDEF 3.6 (H) 12/13/2022 0520   O2SAT 97.4 12/13/2022 0520     Coagulation Profile: Recent Labs  Lab 11/28/2022 1741  INR 1.2    Cardiac Enzymes: No results for input(s):  "CKTOTAL", "CKMB", "CKMBINDEX", "TROPONINI" in the last 168 hours.  HbA1C: No results found for: "HGBA1C"  CBG: Recent Labs  Lab 11/26/2022 1729 12/10/2022 2338 12/13/22 0316 12/13/22 0734  GLUCAP 116* 110* 74 70    Sanjuan Dame, MD Internal Medicine Resident PGY-3 Pager: [phone]

## 2022-12-13 NOTE — Progress Notes (Addendum)
STROKE TEAM PROGRESS NOTE   INTERVAL HISTORY His family is at the bedside.  He is intubated on no sedation, resting comfortably.  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  Discussed that patient will not be able to return to his baseline due to the fact that he will have significant hemiparesis and most likely assist with communication and will likely need a nursing home with 24-hour care.  Family voiced understanding and all questions were answered.  They would like palliative care involved and are waiting on some more family members  Vitals:   12/13/22 0735 12/13/22 0800 12/13/22 0900 12/13/22 1000  BP:  (!) 154/63 (!) 142/66 (!) 147/79  Pulse:  (!) 49 (!) 54 62  Resp:  17 (!) 26 (!) 26  Temp: 97.6 F (36.4 C)     TempSrc: Axillary     SpO2:  98% 98% 97%  Weight:      Height:       CBC:  Recent Labs  Lab 12/19/2022 1741 12/13/22 0753  WBC 4.9 4.3  NEUTROABS 3.7  --   HGB 11.7* 9.9*  HCT 35.2* 28.7*  MCV 86.1 84.2  PLT 176 [id number]*   Basic Metabolic Panel:  Recent Labs  Lab 12/11/2022 1741 12/13/22 0104 12/13/22 0753  NA 138 136 140  K 4.2  --  3.9  CL 105  --  112*  CO2 23  --  20*  GLUCOSE 123*  --  71  BUN 34*  --  26*  CREATININE 2.05*  --  1.60*  CALCIUM 9.0  --  8.1*  MG  --   --  1.4*  PHOS  --   --  2.3*   Lipid Panel:  Recent Labs  Lab 12/13/22 0104  CHOL 110  TRIG 69  HDL  47  CHOLHDL 2.3  VLDL 14  LDLCALC 49   HgbA1c: No results for input(s): "HGBA1C" in the last 168 hours. Urine Drug Screen: No results for input(s): "LABOPIA", "COCAINSCRNUR", "LABBENZ", "AMPHETMU", "THCU", "LABBARB" in the last 168 hours.  Alcohol Level  Recent Labs  Lab 12/19/2022 1741  ETH <10    IMAGING past 24 hours MR ANGIO NECK WO CONTRAST  Addendum Date: 12/13/2022   ADDENDUM REPORT: 12/13/2022 05:07 ADDENDUM: Brain MRI and Head/Neck MRA salient findings discussed by telephone with Dr. Roland Rack on 12/13/2022 at 05:04 . Electronically Signed   By: Genevie Ann M.D.   On: 12/13/2022 05:07   Result Date: 12/13/2022 CLINICAL DATA:  87 year old male with altered mental status. Left MCA and PCA territory infarcts on plain head CT yesterday. EXAM: MRA NECK WITHOUT CONTRAST TECHNIQUE: Angiographic images of the neck were acquired using MRA technique without intravenous contrast. Carotid stenosis measurements (when applicable) are obtained utilizing NASCET criteria, using the distal internal carotid diameter as the denominator. COMPARISON:  Brain MRI today reported separately. FINDINGS: Noncontrast time-of-flight imaging in the neck. There  is antegrade flow signal in both cervical carotid arteries. Flow signal persists at the carotid bifurcations, although the left bifurcation is not well delineated. And this may indicate occlusion of the left ICA at its origin. There is antegrade flow signal in both cervical vertebral arteries. The right appears mildly dominant. IMPRESSION: 1. Noncontrast neck MRA appearance is suspicious for Left ICA Occlusion at its origin. See also Head MRA reported separately. 2. Patent cervical right carotid and vertebral arteries. 3. Abnormal brain MRI today reported separately. Electronically Signed: By: Genevie Ann M.D. On: 12/13/2022 04:57   MR BRAIN WO CONTRAST  Addendum Date: 12/13/2022   ADDENDUM REPORT: 12/13/2022 05:07 ADDENDUM: Brain MRI and Head/Neck MRA salient  findings discussed by telephone with Dr. Roland Rack on 12/13/2022 at 05:04 . Electronically Signed   By: Genevie Ann M.D.   On: 12/13/2022 05:07   Result Date: 12/13/2022 CLINICAL DATA:  87 year old male with altered mental status. Left MCA and PCA territory infarcts on plain head CT yesterday. EXAM: MRI HEAD WITHOUT CONTRAST TECHNIQUE: Multiplanar, multiecho pulse sequences of the brain and surrounding structures were obtained without intravenous contrast. COMPARISON:  Head CTs 0144 hours today and earlier. FINDINGS: Brain: Approximately 10 mm right side CSF subdural hygroma is redemonstrated. There is mild associated mass effect on the right hemisphere. There is an oval fairly circumscribed intra sellar mass with suprasellar extension which is rounded, snowman configuration encompassing 19 x 22 x 30 mm (AP by transverse by CC). There is mass effect on the left suprasellar cistern and optic chiasm. Superimposed large volume of restricted diffusion in the left hemisphere. This is confluent throughout the posterior left frontal lobe, left parietal lobe, left occipital lobe, mesial left temporal lobe and anterior left temporal tip. Confluent involvement of the left basal ganglia. Ventral left thalamic involvement. Confluent involvement also in the left midbrain and cerebral peduncle on series 5, image 67. Contralateral right hemisphere subcentimeter nodular restricted diffusion in the inferior right parietal lobe near the parieto-occipital sulcus on series 5, image 77. In addition to the left cerebral peduncle/midbrain restriction there is confluent restricted diffusion in the inferior right midbrain and tracking into the lateral and posterior pons toward the superior right cerebellar peduncle (series 5, image 62). The medulla spared.  The cerebellum is spared. Fairly well-developed cytotoxic edema in the areas of diffusion restriction. No convincing hemorrhagic transformation. Basilar cisterns remain patent.  Mass effect on the left lateral ventricle but no significant midline shift at this time (series 11, image 14). Negative cervicomedullary junction. No convincing acute intracranial hemorrhage. Vascular: Absent left ICA flow void at the skull base, with no significant reconstitution of the left ICA terminus. Absent distal left vertebral artery flow void in the posterior fossa, and the basilar artery flow void also appears abnormal on series 10, image 8. MRA head and neck this morning are reported separately. Skull and upper cervical spine: Negative visible cervical spine. Visualized bone marrow signal is within normal limits. Sinuses/Orbits: Fluid in the pharynx. Partially visible oral enteric tube in the nasopharynx on series 10, image 1. Postoperative changes to the globes. Only trace paranasal sinus and mastoid fluid. Other: Visible internal auditory structures appear normal. IMPRESSION: 1. Large volume left hemisphere infarction with abundant cytotoxic edema: - subtotal left MCA and PCA territory involvement. - multifocal brainstem infarcts including left greater than right midbrain, right pons. - subcentimeter inferior right parietal lobe infarct. 2. Abnormal flow voids at the skull base suggest Multiple Large Vessel Occlusions. See MRA head and  neck reported separately. 3. No convincing hemorrhagic transformation. Mass effect on the left lateral ventricle, but no midline shift or loss of basilar cisterns at this time. 4. 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 Compression of the optic chiasm. 5. Superimposed 10 mm right side subdural CSF hygroma. Mild associated mass effect on the right hemisphere. Electronically Signed: By: Genevie Ann M.D. On: 12/13/2022 04:55   MR ANGIO HEAD WO CONTRAST  Result Date: 12/13/2022 CLINICAL DATA:  87 year old male with altered mental status. Left MCA and PCA territory infarcts on plain head CT yesterday. EXAM: MRA HEAD  WITHOUT CONTRAST TECHNIQUE: Angiographic images of the Circle of Willis were acquired using MRA technique without intravenous contrast. COMPARISON:  Brain MRI, neck MRA today reported separately. FINDINGS: Anterior circulation: Patent right ICA siphon and terminus without stenosis. Patent right MCA and ACA origins. Patent right posterior communicating artery which appears to be a fetal type PCA origin. The right ACA is patent. No significant anterior communicating artery patency. Right MCA is patent. No right MCA or ACA branch occlusion is identified. But there is absent flow throughout the visible cervical left ICA, left ICA siphon, and terminus. Absent left MCA and ACA flow. Posterior circulation: There is antegrade flow in the distal vertebral arteries in the posterior fossa, as on the neck MRA today the right vertebral appears dominant. Vertebrobasilar junction, PICA and/or AICA origins appear patent. Proximal basilar artery is patent, but there is absent flow in the distal basilar artery with abrupt flow caught off approximately 8 mm below the basilar tip. See series 1030, image 7. Fetal type right PCA origin appears to supply the right PCA which remains patent. Little to no flow in the left PCA, left posterior communicating artery. Anatomic variants: Possible fetal type right PCA origin. Diminutive or absent anterior communicating artery. Other: See Brain MRI reported separately. IMPRESSION: 1. 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 - Left PCA. - Left ICA, Left ACA and MCA. 2. Right ICA and Right anterior circulation remain patent, with a fetal type Right PCA origin keeping the Right PCA patent. 3. Distal vertebral arteries and Proximal Basilar remain patent. Brain MRI and Head/Neck MRA salient findings discussed by telephone with Dr. Roland Rack on 12/13/2022 at 05:04 . Electronically Signed   By: Genevie Ann M.D.   On: 12/13/2022 05:06   CT HEAD WO CONTRAST  (5MM)  Result Date: 12/13/2022 CLINICAL DATA:  Follow-up examination for stroke. EXAM: CT HEAD WITHOUT CONTRAST TECHNIQUE: Contiguous axial images were obtained from the base of the skull through the vertex without intravenous contrast. RADIATION DOSE REDUCTION: This exam was performed according to the departmental dose-optimization program which includes automated exposure control, adjustment of the mA and/or kV according to patient size and/or use of iterative reconstruction technique. COMPARISON:  Prior CT from 12/20/2022. FINDINGS: Brain: Evolving cytotoxic edema involving the left cerebral hemisphere again seen. Specifically, there is involvement of the left MCA and PCA distributions, with involvement of the left basal ganglia. Overall size and distribution is similar to previous, although edema is now more pronounced and well-defined as compared to prior. No evidence for hemorrhagic transformation. No significant mass effect or midline shift. No acute intracranial hemorrhage. No other acute large vessel territory infarct. No hydrocephalus. Asymmetric prominence of the extra-axial space overlying the right cerebral hemisphere noted, stable. No extra-axial collection. Lobulated well-circumscribed hyperdense sellar/suprasellar region again seen, measuring 2.9 x 1.9 x 2.2 cm. No other visible  mass. Vascular: No abnormal hyperdense vessel. Scattered vascular calcifications noted within the carotid siphons. Skull: Scalp soft tissues and calvarium demonstrate no new finding. Sinuses/Orbits: Globes orbital soft tissues demonstrate no acute finding. Paranasal sinuses are largely clear. No mastoid effusion. Patient is intubated. Other: None. IMPRESSION: 1. Continued interval evolution of large left MCA and PCA distribution infarcts, similar in size and distribution from prior. No evidence for hemorrhagic transformation or significant regional mass effect at this time. 2. No other new acute intracranial abnormality. 3.  2.9 x 1.9 x 2.2 cm hyperdense sellar/suprasellar region mass, stable. 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 Large aneurysm would be the primary differential consideration. Electronically Signed   By: Jeannine Boga M.D.   On: 12/13/2022 02:40   DG Chest Port 1 View  Result Date: 12/20/2022 CLINICAL DATA:  post intubation EXAM: PORTABLE CHEST 1 VIEW COMPARISON:  Same day chest x-ray and CT chest. FINDINGS: Bibasilar opacities. Endotracheal tube tip is approximately 5 cm above the carina. Gastric tube courses below the diaphragm with the tip in the stomach and the side port likely below the GE junction. No visible pleural effusions or pneumothorax. Cardiomediastinal silhouette is within normal limits. IMPRESSION: 1. Endotracheal tube tip is approximately 5 cm above the carina. 2. Bibasilar opacities, further characterized on same day CT chest. Electronically Signed   By: Margaretha Sheffield M.D.   On: 12/02/2022 20:45   CT CHEST ABDOMEN PELVIS WO CONTRAST  Result Date: 12/17/2022 CLINICAL DATA:  Nonresponsive EXAM: CT CHEST, ABDOMEN AND PELVIS WITHOUT CONTRAST TECHNIQUE: Multidetector CT imaging of the chest, abdomen and pelvis was performed following the standard protocol without IV contrast. RADIATION DOSE REDUCTION: This exam was performed according to the departmental dose-optimization program which includes automated exposure control, adjustment of the mA and/or kV according to patient size and/or use of iterative reconstruction technique. COMPARISON:  Chest CT 08/07/2022 FINDINGS: CT CHEST FINDINGS Cardiovascular: Limited evaluation without intravenous contrast. Mild aortic atherosclerosis. Ascending aortic diameter of 4 cm. No pericardial effusion Mediastinum/Nodes: Midline trachea. No thyroid mass. No suspicious lymph nodes. Esophagus within normal limits. Lungs/Pleura: Multifocal consolidation and ground-glass density in the right upper lobe and left greater than right lower  lobes. No pleural effusion or pneumothorax. Musculoskeletal: Sternum is intact.  No acute osseous abnormality CT ABDOMEN PELVIS FINDINGS Hepatobiliary: No focal liver abnormality is seen. No gallstones, gallbladder wall thickening, or biliary dilatation. Pancreas: Unremarkable. No pancreatic ductal dilatation or surrounding inflammatory changes. Spleen: Normal in size without focal abnormality. Adrenals/Urinary Tract: Adrenal glands are unremarkable. Kidneys are normal, without renal calculi, focal lesion, or hydronephrosis. Bladder is unremarkable. Stomach/Bowel: Stomach is within normal limits. Appendix appears normal. No evidence of bowel wall thickening, distention, or inflammatory changes. Vascular/Lymphatic: Moderate aortic atherosclerosis. No aneurysm. No suspicious lymph nodes. Reproductive: Status post prostatectomy. Other: Negative for pelvic effusion or free air Musculoskeletal: No acute or suspicious osseous abnormality IMPRESSION: 1. Multifocal consolidation and ground-glass density within the right upper lobe and left greater than right lower lobes, consistent with multifocal pneumonia. 2. No CT evidence for acute intra-abdominal or pelvic abnormality. 3. Aortic atherosclerosis. Aortic Atherosclerosis (ICD10-I70.0). Electronically Signed   By: Donavan Foil M.D.   On: 12/16/2022 19:25   CT Head Wo Contrast  Result Date: 12/13/2022 CLINICAL DATA:  Altered mental status. EXAM: CT HEAD WITHOUT CONTRAST TECHNIQUE: Contiguous axial images were obtained from the base of the skull through the vertex without intravenous contrast. RADIATION DOSE REDUCTION: This exam was performed according to the departmental dose-optimization program  which includes automated exposure control, adjustment of the mA and/or kV according to patient size and/or use of iterative reconstruction technique. COMPARISON:  Head CT dated 08/07/2022. FINDINGS: Brain: Large areas of low attenuation involving the left temporal lobe, left  parietal and occipital lobes consistent with infarcts, likely acute or subacute. There is also involvement of the left basal ganglia and thalamus. There is moderate age-related atrophy and chronic microvascular ischemic changes. No acute intracranial hemorrhage. A 1.6 x 1.6 cm rounded high attenuating lesion arising from the sella has increased since the prior CT this lesion measures approximately 3.5 cm in craniocaudal length. There is dilatation of the extra-axial space over the right frontal lobe as seen on the prior CT which may represent an old hygroma or an arachnoid cyst. Vascular: No hyperdense vessel or unexpected calcification. Skull: Normal. Negative for fracture or focal lesion. Sinuses/Orbits: No acute finding. Other: None IMPRESSION: 1. No acute intracranial hemorrhage. 2. Large left MCA and PCA territory infarct stone likely acute or subacute. Correlation and further evaluation with MRI, if clinically indicated, recommended. 3. Slight interval increase in the size of the high attenuating lesion arising from the sella. These results were called by telephone at the time of interpretation on 12/11/2022 at 7:17 pm to provider [facility] , who verbally acknowledged these results. Electronically Signed   By: Anner Crete M.D.   On: 11/28/2022 19:19   DG Chest Port 1 View  Result Date: 11/23/2022 CLINICAL DATA:  Sepsis, unresponsive EXAM: PORTABLE CHEST 1 VIEW COMPARISON:  08/07/2022 FINDINGS: Single frontal view of the chest demonstrates an unremarkable cardiac silhouette. There is chronic elevation of the left hemidiaphragm. Patchy consolidation at the left lung base could reflect airspace disease or hypoventilatory change. No effusion or pneumothorax. No acute bony abnormalities. Increased density overlying the manubrium and left supraclavicular regions may be external to the patient. IMPRESSION: 1. Patchy left basilar consolidation which may reflect atelectasis or airspace disease. 2.  Increased density overlying the manubrium and left supraclavicular regions, felt to represent overlying shadow from external object. PA and lateral views may be useful for follow-up. Electronically Signed   By: Randa Ngo M.D.   On: 11/24/2022 18:05    PHYSICAL EXAM  Temp:  [97.6 F (36.4 C)-99.4 F (37.4 C)] 98.8 F (37.1 C) (03/22 1144) Pulse Rate:  [47-103] 62 (03/22 1000) Resp:  [17-31] 26 (03/22 1000) BP: (127-162)/(59-128) 147/79 (03/22 1000) SpO2:  [92 %-100 %] 97 % (03/22 1000) FiO2 (%):  [40 %-100 %] 40 % (03/22 1212) Weight:  [61.8 kg-64.4 kg] 61.8 kg (03/22 0500)  General -critically ill male on ventilator with no sedation in no apparent distress Cardiovascular - Regular rhythm and rate.  Mental Status -  eyes are closed, does not open eyes to voice or noxious stimuli, does not follow commands, pupils equal and reactive, eyes are disconjugate with left eye slightly downward, positive doll's eye, has no blink to threat bilaterally, no corneal reflex bilaterally, right arm with flexion and slight horizontal movement to pain, bilateral lowers with triple flexion left arm flickers to pain has weak cough and gag, grimaces to noxious stimuli   ASSESSMENT/PLAN Nathan [name] is a 87 y.o. male with history of anemia, bradycardia, HLD, HTN, systolic murmur, tobacco use presenting with decreased responsiveness. He was last seen in his normal state of health at 0900 on 3/21.    Stroke:  Large PCA/posterior MCA infarct, ischemic brain infarcts, with cytotoxic cerebral edema and brain compression Etiology: Large vessel  disease versus cardioembolic Code Stroke CT head - large PCA/posterior MCA infarct  MRI  Large volume left hemisphere infarction with abundant cytotoxic edema: subtotal left MCA and PCA territory involvement. multifocal brainstem infarcts including left greater than right midbrain, right pons.subcentimeter inferior right parietal lobe infarct. MRA  - Multiple  Large Vessel Occlusions without reconstitution: Distal Basilar Artery. Left PCA. Left ICA, Left ACA and MCA. 2D Echo pending LDL 49 HgbA1c pending VTE prophylaxis - lovenox    Diet   Diet NPO time specified   aspirin 81 mg daily prior to admission, now on aspirin 325 mg daily.  Therapy recommendations: Pending Disposition: Pending  Brain compression due to cerebral edema  Hypertonic saline at 11ml/hr  Last sodium 136 Continue serial sodium levels for goal of 1 50-1 55  Acute respiratory failure with hypoxia due to large stroke Possible aspiration pneumonia Continue antibiotics per critical care CCM primary- management for   Hypertension Home meds: Lisinopril 5 mg Stable Permissive hypertension (OK if < 220/120) but gradually normalize in 5-7 days Long-term BP goal normotensive  Hyperlipidemia Home meds: Simvastatin 20 mg, resumed in hospital LDL 49, goal < 70 Continue statin at discharge  Other Stroke Risk Factors Advanced Age >/= 74  Cigarette smoker, advised to stop smoking  Other Active Problems AKI on CKD3b Daily BMP Cr 1.60 Hypoglycemia Gout  Hospital day # 1  Nathan Gandy DNP, ACNPC-AG  [facility]   ATTENDING NOTE: I reviewed above note and agree with the assessment and plan. Pt was seen and examined.   Of note, palliative care on board, discussed with family, now transition to full comfort care measures.  For detailed assessment and plan, please refer to above/below as I have made changes wherever appropriate.   Neurology will sign off. Please call with questions. Thanks for the consult.  Nathan Hawking, MD PhD Stroke Neurology 12/13/2022 7:24 PM    To contact Stroke Continuity provider, please refer to [email or web address] After hours, contact General Neurology

## 2022-12-13 NOTE — Progress Notes (Signed)
Patient has an occluded basilar and left ICA.  Given the amount of injury to his left hemisphere and brainstem already, I do not think he is an interventional candidate.  This does explain his clinical exam which I initially thought was due to likely herniation from edema, but is actually due to basilar occlusion.  At this point, I think his likelihood of any type of independence is essentially zero, and I would recommend family be counseled to move to comfort care once they arrive.  Stroke team will follow.  Roland Rack, MD [facility] [phone]  If 7pm- 7am, please page neurology on call as listed in [place].

## 2022-12-13 NOTE — Progress Notes (Signed)
   12/13/22 2000  Spiritual Encounters  Type of Visit Initial  Care provided to: Patient  Conversation partners present during encounter Nurse  Referral source Family  Reason for visit End-of-life  OnCall Visit Yes  Spiritual Framework  Presenting Themes Impactful experiences and emotions  Community/Connection Family  Family Stress Factors Loss  Interventions  Spiritual Care Interventions Made Compassionate presence;Reflective listening;Normalization of emotions   Ch responded to request for emotional and spiritual support. Pt's family was at bedside. Pt's daughter said that it is important for the family that their father remind comfortable in the process. The family have accepted that it is the end of their father's life. They want him to go in peace. Ch provided compassionate touch and presence. Ch normalized the family's feelings and provided a safe space to grieve. No follow-up needed at this time.

## 2022-12-13 NOTE — Progress Notes (Signed)
RT and RN transported patient to CT and back to 2M10 without event.  Patient suctioned prior to and after transport per VAP protocol.

## 2022-12-13 NOTE — Progress Notes (Signed)
RT and RN transported patient to MRI and back to 2M10 without event. Patient suctioned prior to and after transport per VAP protocol.

## 2022-12-13 NOTE — Consult Note (Signed)
Consultation Note Date: 12/13/2022   Patient Name: Nathan [name]  DOB: Oct 13, 1935  MRN: [id number]  Age / Sex: 87 y.o., male  PCP: Carrolyn Meiers, MD Referring Physician: Lanier Clam, MD  Reason for Consultation:  GOC  HPI/Patient Profile: 87 y.o. 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 He is intubated and unresponsive. Palliative medicine consulted for [place].   Primary Decision Maker NEXT OF KIN  - 3 sons, 1 daughter  Discussion: Chart reviewed including labs, progress notes, imaging from this and previous encounters.  Met at the bedside with patient's daughter [name], and sons [name], and [name]. His granddaughter [name] was also present.  Patient is retired after serving in both [place], then working for [employer]. Graduated from college with a degree in business- very successful and was able to retire at the young age of 69.  He enjoys following sports- most notably follows [employer].  Reviewed family's understanding of patient's current medical situation. They understand he has had a devastating stroke and is unlikely to gain meaningful functional recovery.  Family all agree that he would not want his life prolonged artificially in his current condition. He made the decision to transition their mom to comfort a few years ago when she was dying of cancer- they have a clear understanding of what his wishes would be.  Discussed transition to comfort measures only which includes stopping IV fluids, antibiotics, labs and providing symptom management for SOB, anxiety, nausea, vomiting, and other symptoms of dying. We also discussed the process of extubation.  There is one Granddaughter en route from [place] and  they would like to extubate to comfort after she arrives. Agree to starting comfort medications now.     SUMMARY OF RECOMMENDATIONS -DNR- full comfort measures only -D/C interventions not indicated to comfort -Start morphine infusion 2mg /hr. Increase infusion by 50% if more than three boluses needed in one hour.  -2mg  bolus dose IV from infusion q15 mins prn for increased RR, SOB, any signs of discomfort -Lorazepam 2mg  IV q4hr prn agitation, discomfort or SOB refractory to morphine -Robinul 0.2mg  q2hr prn for terminal secretions -Extubate to comfort when family is ready- anticipate will be this afternoon/early evening after his Granddaughter arrives from [place]: DNR   Prognosis:   Hours - Days  Discharge Planning: Anticipated Hospital Death  Primary Diagnoses: Present on Admission:  CVA (cerebral vascular accident) ([place])  Stroke (cerebrum) ([place])   Review of Systems  Unable to perform ROS: Intubated    Physical Exam Vitals and nursing note reviewed.  Cardiovascular:     Rate and Rhythm: Normal rate.  Pulmonary:     Comments: intubated Neurological:     Comments: unresponsive     Vital Signs: BP (!) 147/79   Pulse 62   Temp 98.8 F (37.1 C) (Axillary)  Resp (!) 26   Ht 5\' 9"  (1.753 m)   Wt 61.8 kg   SpO2 97%   BMI 20.12 kg/m  Pain Scale: CPOT       SpO2: SpO2: 97 % O2 Device:SpO2: 97 % O2 Flow Rate: .O2 Flow Rate (L/min): 15 L/min  IO: Intake/output summary:  Intake/Output Summary (Last 24 hours) at 12/13/2022 1352 Last data filed at 12/13/2022 1156 Gross per 24 hour  Intake 4701.95 ml  Output 500 ml  Net 4201.95 ml    LBM: Last BM Date : 12/13/22 Baseline Weight: Weight: 64.4 kg Most recent weight: Weight: 61.8 kg       Thank you for this consult. Palliative medicine will continue to follow and assist as needed.   Greater than 50%  of this time was spent counseling and coordinating care related to the  above assessment and plan.  Signed by: Mariana Kaufman, AGNP-C Palliative Medicine    Please contact Palliative Medicine Team phone at [phone] for questions and concerns.  For individual provider: See Shea Evans

## 2022-12-13 NOTE — Progress Notes (Signed)
This chaplain responded to PMT NP-Kasie's consult for EOL spiritual care. The chaplain understands plans are to extubate to comfort this afternoon/early evening.   Two of the Pt. four children accepted the chaplain's invitation for storytelling at the bedside. In this space of sharing, the chaplain learned how the Pt. discipline and love allowed him to retire early and continue to serve his neighbor.  The family identified with the word "legacy" as they identified with the memories.  The chaplain extended the invitation for F/U spiritual care and prayer as additional family is present.  Chaplain Sallyanne Kuster [phone]

## 2022-12-14 DIAGNOSIS — Z515 Encounter for palliative care: Secondary | ICD-10-CM

## 2022-12-14 LAB — HEMOGLOBIN A1C
Hgb A1c MFr Bld: 5.3 % (ref 4.8–5.6)
Mean Plasma Glucose: 105 mg/dL

## 2022-12-15 LAB — CULTURE, RESPIRATORY W GRAM STAIN: Culture: NORMAL

## 2022-12-17 LAB — CULTURE, BLOOD (ROUTINE X 2)
Culture: NO GROWTH
Culture: NO GROWTH

## 2022-12-23 NOTE — Progress Notes (Signed)
   Palliative Medicine Inpatient Follow Up Note   Per morning chart review it is noted that Mr. Screws passed away early this morning.   The PMT will be available if additional support is needed.  No Charge ______________________________________________________________________________________ [facility] Team Team Cell Phone: [phone] Please utilize secure chat with additional questions, if there is no response within 30 minutes please call the above phone number  Palliative Medicine Team providers are available by phone from 7am to 7pm daily and can be reached through the team cell phone.  Should this patient require assistance outside of these hours, please call the patient's attending physician.

## 2022-12-23 NOTE — Progress Notes (Signed)
Pt asystole on monitor, with palpable pulse or heart beat. Allegra Grana, RN and Sandford Craze, RN verified time of death.  No family at bedside. However family had requested to be called if/when pt passed. Pts daughter, Nathan [name], and son, Nathan [name], called and made aware.

## 2022-12-23 DEATH — deceased

## 2022-12-24 DIAGNOSIS — Z66 Do not resuscitate: Secondary | ICD-10-CM | POA: Diagnosis present

## 2022-12-24 DIAGNOSIS — G935 Compression of brain: Secondary | ICD-10-CM | POA: Diagnosis present

## 2022-12-24 DIAGNOSIS — Z515 Encounter for palliative care: Secondary | ICD-10-CM

## 2023-01-09 ENCOUNTER — Other Ambulatory Visit: Payer: Self-pay | Admitting: Student

## 2023-01-22 NOTE — Death Summary Note (Signed)
DEATH SUMMARY   Patient Details  Name: Nathan [name] MRN: [id number] DOB: 20-Jul-1936  Admission/Discharge Information   Admit Date:  December 22, 2022  Date of Death: Date of Death: 2022-12-24  Time of Death: Time of Death: 0315  Length of Stay: 2  Referring Physician: Carrolyn Meiers, MD   Reason(s) for Hospitalization  Large MCA and brainstem stroke with edema  Diagnoses  Preliminary cause of death: Brainstem stroke Secondary Diagnoses (including complications and co-morbidities):  Principal Problem:   CVA (cerebral vascular accident) Active Problems:   Stroke (cerebrum)   Acute kidney injury superimposed on chronic kidney disease   Acute respiratory failure with hypoxia   Pneumonia   Brief Hospital Course (including significant findings, care, treatment, and services provided and events leading to death)  Nathan [name] is a 87 y.o. year old male who presented strokelike symptoms.  Large MCA territory stroke is [place] as brainstem stroke.  Neurologically devastated.  Out of the window for tPA or other intervention.  Discussed with family he would live on artificial means for the rest of his life.  Never been independent.  After long discussion they agreed on DNR.  Dementia made him comfort care.  He was passing extubated and died.    Pertinent Labs and Studies  Significant Diagnostic Studies ECHOCARDIOGRAM COMPLETE  Result Date: 12/13/2022    ECHOCARDIOGRAM REPORT   Patient Name:   Nathan [name] Date of Exam: 12/13/2022 Medical Rec #:  [id number]         Height:       69.0 in Accession #:    [id number]        Weight:       136.2 lb Date of Birth:  1936/08/27        BSA:          1.755 m Patient Age:    54 years          BP:           144/59 mmHg Patient Gender: M                 HR:           56 bpm. Exam Location:  Inpatient Procedure: 2D Echo, Cardiac Doppler and Color Doppler Indications:    I63.9 Stroke  History:        Patient has prior history of Echocardiogram  examinations, most                 recent 08/27/2021. Pacemaker, CKD and Stroke; Risk                 Factors:Hypertension and Dyslipidemia.  Sonographer:    Wilkie Aye RVT RCS Referring Phys: 01/09/[id number] Silvestre Moment [facility]  Sonographer Comments: Suboptimal apical window, suboptimal parasternal window, Technically difficult study due to poor echo windows, Technically challenging study due to limited acoustic windows and echo performed with patient supine and on artificial respirator. Image acquisition challenging due to respiratory motion. Definity deemed inappropriate due to limited and poor windows. IMPRESSIONS  1. Technically difficult study, Definity contrast not given, but would have improved endocardial definition  2. Left ventricular ejection fraction, by estimation, is 55 to 60%. The left ventricle has normal function. The left ventricle demonstrates global hypokinesis. Left ventricular diastolic parameters are indeterminate.  3. Right ventricular systolic function is mildly reduced. The right ventricular size is normal.  4. Right atrial size was mildly dilated.  5. The inferior vena cava is normal in size with <  50% respiratory variability, suggesting right atrial pressure of 8 mmHg.  6. The mitral valve was not well visualized. Trivial mitral valve regurgitation.  7. The aortic valve was not well visualized. Aortic valve regurgitation is not visualized. Comparison(s): No significant change from prior study. 08/27/2021: LVEF 55-60%. FINDINGS  Left Ventricle: Left ventricular ejection fraction, by estimation, is 55 to 60%. The left ventricle has normal function. The left ventricle demonstrates global hypokinesis. The left ventricular internal cavity size was normal in size. There is no left ventricular hypertrophy. Left ventricular diastolic parameters are indeterminate. Right Ventricle: The right ventricular size is normal. No increase in right ventricular wall thickness. Right ventricular systolic function is mildly  reduced. Left Atrium: Left atrial size was normal in size. Right Atrium: Right atrial size was mildly dilated. Pericardium: There is no evidence of pericardial effusion. Mitral Valve: The mitral valve was not well visualized. Trivial mitral valve regurgitation. Tricuspid Valve: The tricuspid valve is not well visualized. Tricuspid valve regurgitation is not demonstrated. Aortic Valve: The aortic valve was not well visualized. Aortic valve regurgitation is not visualized. Aortic valve mean gradient measures 1.0 mmHg. Aortic valve peak gradient measures 2.0 mmHg. Aortic valve area, by VTI measures 3.03 cm. Pulmonic Valve: The pulmonic valve was not well visualized. Pulmonic valve regurgitation is not visualized. Aorta: The aortic root and ascending aorta are structurally normal, with no evidence of dilitation. Venous: The inferior vena cava is normal in size with less than 50% respiratory variability, suggesting right atrial pressure of 8 mmHg. IAS/Shunts: The interatrial septum was not well visualized. Additional Comments: A device lead is visualized.  LEFT VENTRICLE PLAX 2D LVIDd:         4.90 cm     Diastology LVIDs:         4.40 cm     LV e' medial:    5.18 cm/s LV PW:         1.10 cm     LV E/e' medial:  6.9 LV IVS:        0.90 cm     LV e' lateral:   8.50 cm/s LVOT diam:     2.10 cm     LV E/e' lateral: 4.2 LV SV:         48 LV SV Index:   27 LVOT Area:     3.46 cm  LV Volumes (MOD) LV vol d, MOD A4C: 92.8 ml LV vol s, MOD A4C: 53.2 ml LV SV MOD A4C:     92.8 ml RIGHT VENTRICLE            IVC RV Basal diam:  4.40 cm    IVC diam: 2.00 cm RV S prime:     8.05 cm/s LEFT ATRIUM             Index        RIGHT ATRIUM           Index LA diam:        3.00 cm 1.71 cm/m   RA Area:     20.10 cm LA Vol (A2C):   72.9 ml 41.54 ml/m  RA Volume:   60.10 ml  34.24 ml/m LA Vol (A4C):   42.7 ml 24.33 ml/m LA Biplane Vol: 58.9 ml 33.56 ml/m  AORTIC VALVE AV Area (Vmax):    3.57 cm AV Area (Vmean):   3.19 cm AV Area (VTI):      3.03 cm AV Vmax:  71.30 cm/s AV Vmean:          47.100 cm/s AV VTI:            0.158 m AV Peak Grad:      2.0 mmHg AV Mean Grad:      1.0 mmHg LVOT Vmax:         73.50 cm/s LVOT Vmean:        43.400 cm/s LVOT VTI:          0.138 m LVOT/AV VTI ratio: 0.87  AORTA Ao Root diam: 3.55 cm MITRAL VALVE MV Area (PHT): 2.60 cm    SHUNTS MV Decel Time: 292 msec    Systemic VTI:  0.14 m MV E velocity: 35.70 cm/s  Systemic Diam: 2.10 cm MV A velocity: 25.40 cm/s MV E/A ratio:  1.41 Lyman Bishop MD Electronically signed by Lyman Bishop MD Signature Date/Time: 12/13/2022/3:41:23 PM    Final    MR ANGIO NECK WO CONTRAST  Addendum Date: 12/13/2022   ADDENDUM REPORT: 12/13/2022 05:07 ADDENDUM: Brain MRI and Head/Neck MRA salient findings discussed by telephone with Dr. Roland Rack on 12/13/2022 at 05:04 . Electronically Signed   By: Genevie Ann M.D.   On: 12/13/2022 05:07   Result Date: 12/13/2022 CLINICAL DATA:  87 year old male with altered mental status. Left MCA and PCA territory infarcts on plain head CT yesterday. EXAM: MRA NECK WITHOUT CONTRAST TECHNIQUE: Angiographic images of the neck were acquired using MRA technique without intravenous contrast. Carotid stenosis measurements (when applicable) are obtained utilizing NASCET criteria, using the distal internal carotid diameter as the denominator. COMPARISON:  Brain MRI today reported separately. FINDINGS: Noncontrast time-of-flight imaging in the neck. There is antegrade flow signal in both cervical carotid arteries. Flow signal persists at the carotid bifurcations, although the left bifurcation is not well delineated. And this may indicate occlusion of the left ICA at its origin. There is antegrade flow signal in both cervical vertebral arteries. The right appears mildly dominant. IMPRESSION: 1. Noncontrast neck MRA appearance is suspicious for Left ICA Occlusion at its origin. See also Head MRA reported separately. 2. Patent cervical right carotid  and vertebral arteries. 3. Abnormal brain MRI today reported separately. Electronically Signed: By: Genevie Ann M.D. On: 12/13/2022 04:57   MR BRAIN WO CONTRAST  Addendum Date: 12/13/2022   ADDENDUM REPORT: 12/13/2022 05:07 ADDENDUM: Brain MRI and Head/Neck MRA salient findings discussed by telephone with Dr. Roland Rack on 12/13/2022 at 05:04 . Electronically Signed   By: Genevie Ann M.D.   On: 12/13/2022 05:07   Result Date: 12/13/2022 CLINICAL DATA:  87 year old male with altered mental status. Left MCA and PCA territory infarcts on plain head CT yesterday. EXAM: MRI HEAD WITHOUT CONTRAST TECHNIQUE: Multiplanar, multiecho pulse sequences of the brain and surrounding structures were obtained without intravenous contrast. COMPARISON:  Head CTs 0144 hours today and earlier. FINDINGS: Brain: Approximately 10 mm right side CSF subdural hygroma is redemonstrated. There is mild associated mass effect on the right hemisphere. There is an oval fairly circumscribed intra sellar mass with suprasellar extension which is rounded, snowman configuration encompassing 19 x 22 x 30 mm (AP by transverse by CC). There is mass effect on the left suprasellar cistern and optic chiasm. Superimposed large volume of restricted diffusion in the left hemisphere. This is confluent throughout the posterior left frontal lobe, left parietal lobe, left occipital lobe, mesial left temporal lobe and anterior left temporal tip. Confluent involvement of the left basal ganglia. Ventral left thalamic involvement. Confluent  involvement also in the left midbrain and cerebral peduncle on series 5, image 67. Contralateral right hemisphere subcentimeter nodular restricted diffusion in the inferior right parietal lobe near the parieto-occipital sulcus on series 5, image 77. In addition to the left cerebral peduncle/midbrain restriction there is confluent restricted diffusion in the inferior right midbrain and tracking into the lateral and posterior  pons toward the superior right cerebellar peduncle (series 5, image 62). The medulla spared.  The cerebellum is spared. Fairly well-developed cytotoxic edema in the areas of diffusion restriction. No convincing hemorrhagic transformation. Basilar cisterns remain patent. Mass effect on the left lateral ventricle but no significant midline shift at this time (series 11, image 14). Negative cervicomedullary junction. No convincing acute intracranial hemorrhage. Vascular: Absent left ICA flow void at the skull base, with no significant reconstitution of the left ICA terminus. Absent distal left vertebral artery flow void in the posterior fossa, and the basilar artery flow void also appears abnormal on series 10, image 8. MRA head and neck this morning are reported separately. Skull and upper cervical spine: Negative visible cervical spine. Visualized bone marrow signal is within normal limits. Sinuses/Orbits: Fluid in the pharynx. Partially visible oral enteric tube in the nasopharynx on series 10, image 1. Postoperative changes to the globes. Only trace paranasal sinus and mastoid fluid. Other: Visible internal auditory structures appear normal. IMPRESSION: 1. Large volume left hemisphere infarction with abundant cytotoxic edema: - subtotal left MCA and PCA territory involvement. - multifocal brainstem infarcts including left greater than right midbrain, right pons. - subcentimeter inferior right parietal lobe infarct. 2. Abnormal flow voids at the skull base suggest Multiple Large Vessel Occlusions. See MRA head and neck reported separately. 3. No convincing hemorrhagic transformation. Mass effect on the left lateral ventricle, but no midline shift or loss of basilar cisterns at this time. 4. 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 Compression of the optic chiasm. 5. Superimposed 10 mm right side subdural CSF hygroma. Mild associated mass effect on the right  hemisphere. Electronically Signed: By: Genevie Ann M.D. On: 12/13/2022 04:55   MR ANGIO HEAD WO CONTRAST  Result Date: 12/13/2022 CLINICAL DATA:  87 year old male with altered mental status. Left MCA and PCA territory infarcts on plain head CT yesterday. EXAM: MRA HEAD WITHOUT CONTRAST TECHNIQUE: Angiographic images of the Circle of Willis were acquired using MRA technique without intravenous contrast. COMPARISON:  Brain MRI, neck MRA today reported separately. FINDINGS: Anterior circulation: Patent right ICA siphon and terminus without stenosis. Patent right MCA and ACA origins. Patent right posterior communicating artery which appears to be a fetal type PCA origin. The right ACA is patent. No significant anterior communicating artery patency. Right MCA is patent. No right MCA or ACA branch occlusion is identified. But there is absent flow throughout the visible cervical left ICA, left ICA siphon, and terminus. Absent left MCA and ACA flow. Posterior circulation: There is antegrade flow in the distal vertebral arteries in the posterior fossa, as on the neck MRA today the right vertebral appears dominant. Vertebrobasilar junction, PICA and/or AICA origins appear patent. Proximal basilar artery is patent, but there is absent flow in the distal basilar artery with abrupt flow caught off approximately 8 mm below the basilar tip. See series 1030, image 7. Fetal type right PCA origin appears to supply the right PCA which remains patent. Little to no flow in the left PCA, left posterior communicating artery. Anatomic variants: Possible fetal type right PCA  origin. Diminutive or absent anterior communicating artery. Other: See Brain MRI reported separately. IMPRESSION: 1. 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 - Left PCA. - Left ICA, Left ACA and MCA. 2. Right ICA and Right anterior circulation remain patent, with a fetal type Right PCA origin keeping the Right PCA patent. 3.  Distal vertebral arteries and Proximal Basilar remain patent. Brain MRI and Head/Neck MRA salient findings discussed by telephone with Dr. Roland Rack on 12/13/2022 at 05:04 . Electronically Signed   By: Genevie Ann M.D.   On: 12/13/2022 05:06   CT HEAD WO CONTRAST (5MM)  Result Date: 12/13/2022 CLINICAL DATA:  Follow-up examination for stroke. EXAM: CT HEAD WITHOUT CONTRAST TECHNIQUE: Contiguous axial images were obtained from the base of the skull through the vertex without intravenous contrast. RADIATION DOSE REDUCTION: This exam was performed according to the departmental dose-optimization program which includes automated exposure control, adjustment of the mA and/or kV according to patient size and/or use of iterative reconstruction technique. COMPARISON:  Prior CT from 11/25/2022. FINDINGS: Brain: Evolving cytotoxic edema involving the left cerebral hemisphere again seen. Specifically, there is involvement of the left MCA and PCA distributions, with involvement of the left basal ganglia. Overall size and distribution is similar to previous, although edema is now more pronounced and well-defined as compared to prior. No evidence for hemorrhagic transformation. No significant mass effect or midline shift. No acute intracranial hemorrhage. No other acute large vessel territory infarct. No hydrocephalus. Asymmetric prominence of the extra-axial space overlying the right cerebral hemisphere noted, stable. No extra-axial collection. Lobulated well-circumscribed hyperdense sellar/suprasellar region again seen, measuring 2.9 x 1.9 x 2.2 cm. No other visible mass. Vascular: No abnormal hyperdense vessel. Scattered vascular calcifications noted within the carotid siphons. Skull: Scalp soft tissues and calvarium demonstrate no new finding. Sinuses/Orbits: Globes orbital soft tissues demonstrate no acute finding. Paranasal sinuses are largely clear. No mastoid effusion. Patient is intubated. Other: None.  IMPRESSION: 1. Continued interval evolution of large left MCA and PCA distribution infarcts, similar in size and distribution from prior. No evidence for hemorrhagic transformation or significant regional mass effect at this time. 2. No other new acute intracranial abnormality. 3. 2.9 x 1.9 x 2.2 cm hyperdense sellar/suprasellar region mass, stable. 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 Large aneurysm would be the primary differential consideration. Electronically Signed   By: Jeannine Boga M.D.   On: 12/13/2022 02:40   DG Chest Port 1 View  Result Date: 12/21/2022 CLINICAL DATA:  post intubation EXAM: PORTABLE CHEST 1 VIEW COMPARISON:  Same day chest x-ray and CT chest. FINDINGS: Bibasilar opacities. Endotracheal tube tip is approximately 5 cm above the carina. Gastric tube courses below the diaphragm with the tip in the stomach and the side port likely below the GE junction. No visible pleural effusions or pneumothorax. Cardiomediastinal silhouette is within normal limits. IMPRESSION: 1. Endotracheal tube tip is approximately 5 cm above the carina. 2. Bibasilar opacities, further characterized on same day CT chest. Electronically Signed   By: Margaretha Sheffield M.D.   On: 11/30/2022 20:45   CT CHEST ABDOMEN PELVIS WO CONTRAST  Result Date: 12/04/2022 CLINICAL DATA:  Nonresponsive EXAM: CT CHEST, ABDOMEN AND PELVIS WITHOUT CONTRAST TECHNIQUE: Multidetector CT imaging of the chest, abdomen and pelvis was performed following the standard protocol without IV contrast. RADIATION DOSE REDUCTION: This exam was performed according to the departmental dose-optimization program which includes automated exposure control, adjustment of the mA and/or kV according to patient size  and/or use of iterative reconstruction technique. COMPARISON:  Chest CT 08/07/2022 FINDINGS: CT CHEST FINDINGS Cardiovascular: Limited evaluation without intravenous contrast. Mild aortic atherosclerosis. Ascending aortic  diameter of 4 cm. No pericardial effusion Mediastinum/Nodes: Midline trachea. No thyroid mass. No suspicious lymph nodes. Esophagus within normal limits. Lungs/Pleura: Multifocal consolidation and ground-glass density in the right upper lobe and left greater than right lower lobes. No pleural effusion or pneumothorax. Musculoskeletal: Sternum is intact.  No acute osseous abnormality CT ABDOMEN PELVIS FINDINGS Hepatobiliary: No focal liver abnormality is seen. No gallstones, gallbladder wall thickening, or biliary dilatation. Pancreas: Unremarkable. No pancreatic ductal dilatation or surrounding inflammatory changes. Spleen: Normal in size without focal abnormality. Adrenals/Urinary Tract: Adrenal glands are unremarkable. Kidneys are normal, without renal calculi, focal lesion, or hydronephrosis. Bladder is unremarkable. Stomach/Bowel: Stomach is within normal limits. Appendix appears normal. No evidence of bowel wall thickening, distention, or inflammatory changes. Vascular/Lymphatic: Moderate aortic atherosclerosis. No aneurysm. No suspicious lymph nodes. Reproductive: Status post prostatectomy. Other: Negative for pelvic effusion or free air Musculoskeletal: No acute or suspicious osseous abnormality IMPRESSION: 1. Multifocal consolidation and ground-glass density within the right upper lobe and left greater than right lower lobes, consistent with multifocal pneumonia. 2. No CT evidence for acute intra-abdominal or pelvic abnormality. 3. Aortic atherosclerosis. Aortic Atherosclerosis (ICD10-I70.0). Electronically Signed   By: Donavan Foil M.D.   On: 11/27/2022 19:25   CT Head Wo Contrast  Result Date: 11/25/2022 CLINICAL DATA:  Altered mental status. EXAM: CT HEAD WITHOUT CONTRAST TECHNIQUE: Contiguous axial images were obtained from the base of the skull through the vertex without intravenous contrast. RADIATION DOSE REDUCTION: This exam was performed according to the departmental dose-optimization program  which includes automated exposure control, adjustment of the mA and/or kV according to patient size and/or use of iterative reconstruction technique. COMPARISON:  Head CT dated 08/07/2022. FINDINGS: Brain: Large areas of low attenuation involving the left temporal lobe, left parietal and occipital lobes consistent with infarcts, likely acute or subacute. There is also involvement of the left basal ganglia and thalamus. There is moderate age-related atrophy and chronic microvascular ischemic changes. No acute intracranial hemorrhage. A 1.6 x 1.6 cm rounded high attenuating lesion arising from the sella has increased since the prior CT this lesion measures approximately 3.5 cm in craniocaudal length. There is dilatation of the extra-axial space over the right frontal lobe as seen on the prior CT which may represent an old hygroma or an arachnoid cyst. Vascular: No hyperdense vessel or unexpected calcification. Skull: Normal. Negative for fracture or focal lesion. Sinuses/Orbits: No acute finding. Other: None IMPRESSION: 1. No acute intracranial hemorrhage. 2. Large left MCA and PCA territory infarct stone likely acute or subacute. Correlation and further evaluation with MRI, if clinically indicated, recommended. 3. Slight interval increase in the size of the high attenuating lesion arising from the sella. These results were called by telephone at the time of interpretation on 11/28/2022 at 7:17 pm to provider [facility] , who verbally acknowledged these results. Electronically Signed   By: Anner Crete M.D.   On: 11/23/2022 19:19   DG Chest Port 1 View  Result Date: 12/08/2022 CLINICAL DATA:  Sepsis, unresponsive EXAM: PORTABLE CHEST 1 VIEW COMPARISON:  08/07/2022 FINDINGS: Single frontal view of the chest demonstrates an unremarkable cardiac silhouette. There is chronic elevation of the left hemidiaphragm. Patchy consolidation at the left lung base could reflect airspace disease or hypoventilatory change.  No effusion or pneumothorax. No acute bony abnormalities. Increased density overlying the manubrium and  left supraclavicular regions may be external to the patient. IMPRESSION: 1. Patchy left basilar consolidation which may reflect atelectasis or airspace disease. 2. Increased density overlying the manubrium and left supraclavicular regions, felt to represent overlying shadow from external object. PA and lateral views may be useful for follow-up. Electronically Signed   By: Randa Ngo M.D.   On: 12/08/2022 18:05    Microbiology No results found for this or any previous visit (from the past 240 hour(s)).  Lab Basic Metabolic Panel: No results for input(s): "NA", "K", "CL", "CO2", "GLUCOSE", "BUN", "CREATININE", "CALCIUM", "MG", "PHOS" in the last 168 hours. Liver Function Tests: No results for input(s): "AST", "ALT", "ALKPHOS", "BILITOT", "PROT", "ALBUMIN" in the last 168 hours. No results for input(s): "LIPASE", "AMYLASE" in the last 168 hours. No results for input(s): "AMMONIA" in the last 168 hours. CBC: No results for input(s): "WBC", "NEUTROABS", "HGB", "HCT", "MCV", "PLT" in the last 168 hours. Cardiac Enzymes: No results for input(s): "CKTOTAL", "CKMB", "CKMBINDEX", "TROPONINI" in the last 168 hours. Sepsis Labs: No results for input(s): "PROCALCITON", "WBC", "LATICACIDVEN" in the last 168 hours.  Procedures/Operations  As per EMR   Bonna Gains Joan Avetisyan 12/24/2022, 6:25 PM

## 2023-03-19 ENCOUNTER — Ambulatory Visit: Payer: Medicare Other | Admitting: Cardiology

## 2023-04-16 ENCOUNTER — Ambulatory Visit: Payer: Medicare Other | Admitting: Podiatry
